# Patient Record
Sex: Male | Born: 1980 | Race: Black or African American | Hispanic: No | Marital: Single | Smoking: Former smoker
Health system: Southern US, Community
[De-identification: ages and names within clinical notes are randomized; demographics above are authoritative.]

## PROBLEM LIST (undated history)

## (undated) DIAGNOSIS — G4733 Obstructive sleep apnea (adult) (pediatric): Secondary | ICD-10-CM

## (undated) DIAGNOSIS — F419 Anxiety disorder, unspecified: Secondary | ICD-10-CM

## (undated) DIAGNOSIS — L219 Seborrheic dermatitis, unspecified: Secondary | ICD-10-CM

## (undated) DIAGNOSIS — R911 Solitary pulmonary nodule: Secondary | ICD-10-CM

## (undated) DIAGNOSIS — A6000 Herpesviral infection of urogenital system, unspecified: Secondary | ICD-10-CM

## (undated) DIAGNOSIS — H5200 Hypermetropia, unspecified eye: Secondary | ICD-10-CM

## (undated) DIAGNOSIS — E669 Obesity, unspecified: Secondary | ICD-10-CM

## (undated) DIAGNOSIS — I10 Essential (primary) hypertension: Secondary | ICD-10-CM

## (undated) DIAGNOSIS — R3129 Other microscopic hematuria: Secondary | ICD-10-CM

## (undated) HISTORY — DX: Obstructive sleep apnea (adult) (pediatric): G47.33

## (undated) HISTORY — PX: WISDOM TOOTH EXTRACTION: SHX21

## (undated) HISTORY — DX: Hypermetropia, unspecified eye: H52.00

## (undated) HISTORY — DX: Seborrheic dermatitis, unspecified: L21.9

## (undated) HISTORY — DX: Anxiety disorder, unspecified: F41.9

## (undated) HISTORY — DX: Herpesviral infection of urogenital system, unspecified: A60.00

## (undated) HISTORY — DX: Solitary pulmonary nodule: R91.1

## (undated) HISTORY — DX: Essential (primary) hypertension: I10

## (undated) HISTORY — DX: Obesity, unspecified: E66.9

## (undated) HISTORY — DX: Other microscopic hematuria: R31.29

---

## 2008-11-29 ENCOUNTER — Emergency Department (HOSPITAL_COMMUNITY): Admission: EM | Admit: 2008-11-29 | Discharge: 2008-11-29 | Payer: Self-pay | Admitting: Emergency Medicine

## 2009-06-26 ENCOUNTER — Emergency Department (HOSPITAL_COMMUNITY): Admission: EM | Admit: 2009-06-26 | Discharge: 2009-06-26 | Payer: Self-pay | Admitting: Family Medicine

## 2010-01-21 ENCOUNTER — Emergency Department (HOSPITAL_COMMUNITY): Admission: EM | Admit: 2010-01-21 | Discharge: 2009-03-23 | Payer: Self-pay | Admitting: Emergency Medicine

## 2010-05-20 LAB — RAPID STREP SCREEN (MED CTR MEBANE ONLY): Streptococcus, Group A Screen (Direct): NEGATIVE

## 2011-02-15 DIAGNOSIS — R3129 Other microscopic hematuria: Secondary | ICD-10-CM

## 2011-02-15 DIAGNOSIS — R911 Solitary pulmonary nodule: Secondary | ICD-10-CM

## 2011-02-15 HISTORY — DX: Other microscopic hematuria: R31.29

## 2011-02-15 HISTORY — DX: Solitary pulmonary nodule: R91.1

## 2011-08-01 ENCOUNTER — Encounter: Payer: Self-pay | Admitting: Medical

## 2011-08-01 ENCOUNTER — Ambulatory Visit (INDEPENDENT_AMBULATORY_CARE_PROVIDER_SITE_OTHER): Payer: BC Managed Care – PPO | Admitting: Medical

## 2011-08-01 VITALS — BP 122/80 | HR 76 | Temp 97.8°F | Resp 16 | Ht 73.0 in | Wt 241.0 lb

## 2011-08-01 DIAGNOSIS — A6000 Herpesviral infection of urogenital system, unspecified: Secondary | ICD-10-CM

## 2011-08-01 DIAGNOSIS — Z0000 Encounter for general adult medical examination without abnormal findings: Secondary | ICD-10-CM

## 2011-08-01 DIAGNOSIS — Z23 Encounter for immunization: Secondary | ICD-10-CM

## 2011-08-01 DIAGNOSIS — E669 Obesity, unspecified: Secondary | ICD-10-CM

## 2011-08-01 LAB — COMPREHENSIVE METABOLIC PANEL
ALT: 30 U/L (ref 0–53)
AST: 19 U/L (ref 0–37)
Albumin: 4.5 g/dL (ref 3.5–5.2)
Alkaline Phosphatase: 56 U/L (ref 39–117)
BUN: 15 mg/dL (ref 6–23)
CO2: 25 mEq/L (ref 19–32)
Calcium: 9.4 mg/dL (ref 8.4–10.5)
Chloride: 100 mEq/L (ref 96–112)
Creat: 0.88 mg/dL (ref 0.50–1.35)
Glucose, Bld: 84 mg/dL (ref 70–99)
Potassium: 4.3 mEq/L (ref 3.5–5.3)
Sodium: 133 mEq/L — ABNORMAL LOW (ref 135–145)
Total Bilirubin: 0.4 mg/dL (ref 0.3–1.2)
Total Protein: 7.4 g/dL (ref 6.0–8.3)

## 2011-08-01 LAB — POCT URINALYSIS DIPSTICK
Bilirubin, UA: NEGATIVE
Glucose, UA: NEGATIVE
Ketones, UA: NEGATIVE
Leukocytes, UA: NEGATIVE
Nitrite, UA: NEGATIVE
Protein, UA: NEGATIVE
Spec Grav, UA: <=1.005
Urobilinogen, UA: NEGATIVE
pH, UA: 5

## 2011-08-01 LAB — CBC
HCT: 47.6 % (ref 39.0–52.0)
Hemoglobin: 16.2 g/dL (ref 13.0–17.0)
MCH: 29.6 pg (ref 26.0–34.0)
MCHC: 34 g/dL (ref 30.0–36.0)
MCV: 86.9 fL (ref 78.0–100.0)
Platelets: 318 10*3/uL (ref 150–400)
RBC: 5.48 MIL/uL (ref 4.22–5.81)
RDW: 13.3 % (ref 11.5–15.5)
WBC: 6 10*3/uL (ref 4.0–10.5)

## 2011-08-01 LAB — LIPID PANEL
Cholesterol: 122 mg/dL (ref 0–200)
HDL: 39 mg/dL — ABNORMAL LOW (ref >39)
LDL Cholesterol: 68 mg/dL (ref 0–99)
Total CHOL/HDL Ratio: 3.1 Ratio
Triglycerides: 76 mg/dL (ref <150)
VLDL: 15 mg/dL (ref 0–40)

## 2011-08-01 MED ORDER — VALACYCLOVIR HCL 500 MG PO TABS: ORAL_TABLET | ORAL | Status: DC

## 2011-08-01 NOTE — Patient Instructions (Signed)
Preventative Care for Adults, Male       REGULAR HEALTH EXAMS:  A routine yearly physical is a good way to check in with your primary care provider about your health and preventive screening. It is also an opportunity to share updates about your health and any concerns you have, and receive a thorough all-over exam.   Most health insurance companies pay for at least some preventative services.  Check with your health plan for specific coverages.  WHAT PREVENTATIVE SERVICES DO MEN NEED?  Adult men should have their weight and blood pressure checked regularly.   Men age 31 and older should have their cholesterol levels checked regularly.  Beginning at age 31 and continuing to age 31, men should be screened for colorectal cancer.  Certain people should may need continued testing until age 85.  Other cancer screening may include exams for testicular and prostate cancer.  Updating vaccinations is part of preventative care.  Vaccinations help protect against diseases such as the flu.  Lab tests are generally done as part of preventative care to screen for anemia and blood disorders, to screen for problems with the kidneys and liver, to screen for bladder problems, to check blood sugar, and to check your cholesterol level.  Preventative services generally include counseling about diet, exercise, avoiding tobacco, drugs, excessive alcohol consumption, and sexually transmitted infections.    GENERAL RECOMMENDATIONS FOR GOOD HEALTH:  Healthy diet:  Eat a variety of foods, including fruit, vegetables, animal or vegetable protein, such as meat, fish, chicken, and eggs, or beans, lentils, tofu, and grains, such as rice.  Drink plenty of water daily.  Decrease saturated fat in the diet, avoid lots of red meat, processed foods, sweets, fast foods, and fried foods.  Exercise:  Aerobic exercise helps maintain good heart health. At least 30-40 minutes of moderate-intensity exercise is recommended.  For example, a brisk walk that increases your heart rate and breathing. This should be done on most days of the week.   Find a type of exercise or a variety of exercises that you enjoy so that it becomes a part of your daily life.  Examples are running, walking, swimming, water aerobics, and biking.  For motivation and support, explore group exercise such as aerobic class, spin class, Zumba, Yoga,or  martial arts, etc.    Set exercise goals for yourself, such as a certain weight goal, walk or run in a race such as a 5k walk/run.  Speak to your primary care provider about exercise goals.  Disease prevention:  If you smoke or chew tobacco, find out from your caregiver how to quit. It can literally save your life, no matter how long you have been a tobacco user. If you do not use tobacco, never begin.   Maintain a healthy diet and normal weight. Increased weight leads to problems with blood pressure and diabetes.   The Body Mass Index or BMI is a way of measuring how much of your body is fat. Having a BMI above 27 increases the risk of heart disease, diabetes, hypertension, stroke and other problems related to obesity. Your caregiver can help determine your BMI and based on it develop an exercise and dietary program to help you achieve or maintain this important measurement at a healthful level.  High blood pressure causes heart and blood vessel problems.  Persistent high blood pressure should be treated with medicine if weight loss and exercise do not work.   Fat and cholesterol leaves deposits in your arteries   that can block them. This causes heart disease and vessel disease elsewhere in your body.  If your cholesterol is found to be high, or if you have heart disease or certain other medical conditions, then you may need to have your cholesterol monitored frequently and be treated with medication.   Ask if you should have a stress test if your history suggests this. A stress test is a test done on  a treadmill that looks for heart disease. This test can find disease prior to there being a problem.  Avoid drinking alcohol in excess (more than two drinks per day).  Avoid use of street drugs. Do not share needles with anyone. Ask for professional help if you need assistance or instructions on stopping the use of alcohol, cigarettes, and/or drugs.  Brush your teeth twice a day with fluoride toothpaste, and floss once a day. Good oral hygiene prevents tooth decay and gum disease. The problems can be painful, unattractive, and can cause other health problems. Visit your dentist for a routine oral and dental check up and preventive care every 6-12 months.   Look at your skin regularly.  Use a mirror to look at your back. Notify your caregivers of changes in moles, especially if there are changes in shapes, colors, a size larger than a pencil eraser, an irregular border, or development of new moles.  Safety:  Use seatbelts 100% of the time, whether driving or as a passenger.  Use safety devices such as hearing protection if you work in environments with loud noise or significant background noise.  Use safety glasses when doing any work that could send debris in to the eyes.  Use a helmet if you ride a bike or motorcycle.  Use appropriate safety gear for contact sports.  Talk to your caregiver about gun safety.  Use sunscreen with a SPF (or skin protection factor) of 15 or greater.  Lighter skinned people are at a greater risk of skin cancer. Don't forget to also wear sunglasses in order to protect your eyes from too much damaging sunlight. Damaging sunlight can accelerate cataract formation.   Practice safe sex. Use condoms. Condoms are used for birth control and to help reduce the spread of sexually transmitted infections (or STIs).  Some of the STIs are gonorrhea (the clap), chlamydia, syphilis, trichomonas, herpes, HPV (human papilloma virus) and HIV (human immunodeficiency virus) which causes AIDS.  The herpes, HIV and HPV are viral illnesses that have no cure. These can result in disability, cancer and death.   Keep carbon monoxide and smoke detectors in your home functioning at all times. Change the batteries every 6 months or use a model that plugs into the wall.   Vaccinations:  Stay up to date with your tetanus shots and other required immunizations. You should have a booster for tetanus every 10 years. Be sure to get your flu shot every year, since 5%-20% of the [place] population comes down with the flu. The flu vaccine changes each year, so being vaccinated once is not enough. Get your shot in the fall, before the flu season peaks.   Other vaccines to consider:  Pneumococcal vaccine to protect against certain types of pneumonia.  This is normally recommended for adults age 65 or older.  However, adults younger than 31 years old with certain underlying conditions such as diabetes, heart or lung disease should also receive the vaccine.  Shingles vaccine to protect against Varicella Zoster if you are older than age 60, or younger   than 31 years old with certain underlying illness.  Hepatitis A vaccine to protect against a form of infection of the liver by a virus acquired from food.  Hepatitis B vaccine to protect against a form of infection of the liver by a virus acquired from blood or body fluids, particularly if you work in health care.  If you plan to travel internationally, check with your local health department for specific vaccination recommendations.  Cancer Screening:  Most routine colon cancer screening begins at the age of 50. On a yearly basis, doctors may provide special easy to use take-home tests to check for hidden blood in the stool. Sigmoidoscopy or colonoscopy can detect the earliest forms of colon cancer and is life saving. These tests use a small camera at the end of a tube to directly examine the colon. Speak to your caregiver about this at age 50, when routine  screening begins (and is repeated every 5 years unless early forms of pre-cancerous polyps or small growths are found).   At the age of 50 men usually start screening for prostate cancer every year. Screening may begin at a younger age for those with higher risk. Those at higher risk include African-Americans or having a family history of prostate cancer. There are two types of tests for prostate cancer:   Prostate-specific antigen (PSA) testing. Recent studies raise questions about prostate cancer using PSA and you should discuss this with your caregiver.   Digital rectal exam (in which your doctor's lubricated and gloved finger feels for enlargement of the prostate through the anus).   Screening for testicular cancer.  Do a monthly exam of your testicles. Gently roll each testicle between your thumb and fingers, feeling for any abnormal lumps. The best time to do this is after a hot shower or bath when the tissues are looser. Notify your caregivers of any lumps, tenderness or changes in size or shape immediately.     

## 2011-08-01 NOTE — Progress Notes (Signed)
Subjective:   HPI  Ryan Herman is a 31 y.o. male who presents for a complete physical.  New patient today.  Has just went to [facility] for medical issues prior.    Preventative care: Last ophthalmology visit: within the year Last dental visit: over a year ago Last tetanus booster: >10 years  Reviewed their medical, surgical, family, social, medication, and allergy history and updated chart as appropriate.  Past Medical History  Diagnosis Date  . Farsightedness     wears glasses  . Genital herpes     diagnosed 2008    Past Surgical History  Procedure Date  . Wisdom tooth extraction     Family History  Problem Relation Age of Onset  . Hypertension Mother   . Diabetes Father   . Hypertension Brother   . Cancer Maternal Uncle     prostate  . Stroke Neg Hx   . Heart disease Neg Hx     History   Social History  . Marital Status: Married    Spouse Name: N/A    Number of Children: N/A  . Years of Education: N/A   Occupational History  . drives bus for [employer]    Social History Main Topics  . Smoking status: Former Smoker -- 10 years    Types: Cigarettes    Quit date: 07/31/2008  . Smokeless tobacco: Not on file   Comment: prior black and milds, but not daily  . Alcohol Use: No     used to drink heavy partying in younger years, but not daily  . Drug Use: No  . Sexually Active: Not on file   Other Topics Concern  . Not on file   Social History Narrative   Married, 1 daughter 7yo, exercise - some with walking    No current outpatient prescriptions on file prior to visit.    No Known Allergies    Review of Systems Constitutional: -fever, -chills, -sweats, -unexpected weight change, -anorexia, -fatigue Allergy: -sneezing, -itching, -congestion Dermatology: denies changing moles, rash, lumps, new worrisome lesions ENT: -runny nose, -ear pain, -sore throat, -hoarseness, -sinus pain, +teeth pain, -tinnitus, -hearing loss, -epistaxis Cardiology:  -chest  pain, -palpitations, -edema, -orthopnea, -paroxysmal nocturnal dyspnea Respiratory: -cough, -shortness of breath, -dyspnea on exertion, -wheezing, -hemoptysis Gastroenterology: -abdominal pain, -nausea, -vomiting, -diarrhea, -constipation, -blood in stool, -changes in bowel movement, -dysphagia Hematology: -bleeding or bruising problems Musculoskeletal: -arthralgias, -myalgias, -joint swelling, -back pain, -neck pain, -cramping, -gait changes Ophthalmology: -vision changes, -eye redness, -itching, -discharge Urology: -dysuria, -difficulty urinating, -hematuria, -urinary frequency, -urgency, incontinence Neurology: -headache, -weakness, -tingling, -numbness, -speech abnormality, -memory loss, -falls, -dizziness Psychology:  -depressed mood, -agitation, -sleep problems     Objective:   Physical Exam  Filed Vitals:   08/01/11 1043  BP: 122/80  Pulse: 76  Temp: 97.8 F (36.6 C)  Resp: 16    General appearance: alert, no distress, WD/WN, AA male Skin: left nare in fold with 3mm raised brown papule umbilicated, unchanged for years per pt, few scattered benign appearing macules , no worrisome lesions HEENT: normocephalic, conjunctiva/corneas normal, sclerae anicteric, PERRLA, EOMi, nares patent, no discharge or erythema, pharynx normal Oral cavity: MMM, tongue normal, teeth in good repair Neck: supple, no lymphadenopathy, no thyromegaly, no masses, normal ROM, no bruits Chest: non tender, normal shape and expansion Heart: RRR, normal S1, S2, no murmurs Lungs: CTA bilaterally, no wheezes, rhonchi, or rales Abdomen: +bs, soft, non tender, non distended, no masses, no hepatomegaly, no splenomegaly, no bruits Back: non tender, normal  ROM, no scoliosis Musculoskeletal: upper extremities non tender, no obvious deformity, normal ROM throughout, lower extremities non tender, no obvious deformity, normal ROM throughout Extremities: no edema, no cyanosis, no clubbing Pulses: 2+ symmetric, upper and  lower extremities, normal cap refill Neurological: alert, oriented x 3, CN2-12 intact, strength normal upper extremities and lower extremities, sensation normal throughout, DTRs 2+ throughout, no cerebellar signs, gait normal Psychiatric: normal affect, behavior normal, pleasant  GU: normal male external genitalia, nontender, no masses, no hernia, no lymphadenopathy Rectal: deferred   Assessment and Plan :    Encounter Diagnoses  Name Primary?  . Routine general medical examination at a health care facility Yes  . Genital herpes   . Need for diphtheria-tetanus-pertussis (Tdap) vaccine   . Obesity     Physical exam - discussed healthy lifestyle, diet, exercise, preventative care, vaccinations, and addressed their concerns. Requested prior records.   Genital herpes - we will switch to Valtrex once daily suppressive therapy for easier dosing  Tdap given, vaccine counseling and VIS given   Obesity - discussed need for lifestyle changes.   Follow-up pending labs

## 2011-08-02 ENCOUNTER — Encounter: Payer: Self-pay | Admitting: Medical

## 2011-08-03 ENCOUNTER — Telehealth: Payer: Self-pay | Admitting: Medical

## 2011-08-03 NOTE — Telephone Encounter (Signed)
I have never had a patient c/o dizziness with Valtrex. I do not feel that it is an issue.  This medication is well tolerated.  Obviously if he feels dizziness, then he can call and let me know.

## 2011-08-04 NOTE — Telephone Encounter (Signed)
Pt was notified of exact words and will try valtrex and if any side effects will call back

## 2011-08-16 ENCOUNTER — Other Ambulatory Visit (INDEPENDENT_AMBULATORY_CARE_PROVIDER_SITE_OTHER): Payer: BC Managed Care – PPO

## 2011-08-16 DIAGNOSIS — R300 Dysuria: Secondary | ICD-10-CM

## 2011-08-16 LAB — POCT URINALYSIS DIPSTICK
Bilirubin, UA: NEGATIVE
Glucose, UA: NEGATIVE
Ketones, UA: NEGATIVE
Leukocytes, UA: NEGATIVE
Nitrite, UA: NEGATIVE
Protein, UA: NEGATIVE
Spec Grav, UA: 1.02
Urobilinogen, UA: NEGATIVE
pH, UA: 6

## 2011-09-27 ENCOUNTER — Other Ambulatory Visit (INDEPENDENT_AMBULATORY_CARE_PROVIDER_SITE_OTHER): Payer: BC Managed Care – PPO

## 2011-09-27 DIAGNOSIS — R319 Hematuria, unspecified: Secondary | ICD-10-CM

## 2011-09-27 LAB — POCT URINALYSIS DIPSTICK
Bilirubin, UA: NEGATIVE
Glucose, UA: NEGATIVE
Ketones, UA: NEGATIVE
Leukocytes, UA: NEGATIVE
Nitrite, UA: NEGATIVE
Protein, UA: NEGATIVE
Spec Grav, UA: 1.01
Urobilinogen, UA: NEGATIVE
pH, UA: 5

## 2011-09-30 ENCOUNTER — Telehealth: Payer: Self-pay | Admitting: Family Medicine

## 2011-09-30 NOTE — Telephone Encounter (Signed)
Patient called back and said that he is not having burning while urinating. He said he is not seeing any blood while urinating. He also said that he was not having any pelvis pain. He states he will follow up in 2 weeks with UA sample as directed. CLS

## 2011-10-19 ENCOUNTER — Other Ambulatory Visit: Payer: BC Managed Care – PPO

## 2011-11-22 ENCOUNTER — Other Ambulatory Visit (INDEPENDENT_AMBULATORY_CARE_PROVIDER_SITE_OTHER): Payer: BC Managed Care – PPO

## 2011-11-22 DIAGNOSIS — R300 Dysuria: Secondary | ICD-10-CM

## 2011-11-22 LAB — POCT URINALYSIS DIPSTICK
Bilirubin, UA: NEGATIVE
Glucose, UA: NEGATIVE
Ketones, UA: NEGATIVE
Leukocytes, UA: NEGATIVE
Nitrite, UA: NEGATIVE
Protein, UA: NEGATIVE
Spec Grav, UA: 1.02
Urobilinogen, UA: NEGATIVE
pH, UA: 5

## 2011-12-06 ENCOUNTER — Institutional Professional Consult (permissible substitution): Payer: BC Managed Care – PPO | Admitting: Medical

## 2011-12-13 ENCOUNTER — Other Ambulatory Visit (INDEPENDENT_AMBULATORY_CARE_PROVIDER_SITE_OTHER): Payer: BC Managed Care – PPO

## 2011-12-13 DIAGNOSIS — R300 Dysuria: Secondary | ICD-10-CM

## 2011-12-13 LAB — POCT URINALYSIS DIPSTICK
Bilirubin, UA: NEGATIVE
Glucose, UA: NEGATIVE
Ketones, UA: NEGATIVE
Leukocytes, UA: NEGATIVE
Nitrite, UA: NEGATIVE
Spec Grav, UA: 1.02
Urobilinogen, UA: NEGATIVE
pH, UA: 5

## 2011-12-15 LAB — URINE CULTURE
Colony Count: NO GROWTH
Organism ID, Bacteria: NO GROWTH

## 2012-02-21 ENCOUNTER — Encounter: Payer: Self-pay | Admitting: Medical

## 2012-02-21 ENCOUNTER — Ambulatory Visit (INDEPENDENT_AMBULATORY_CARE_PROVIDER_SITE_OTHER): Payer: BC Managed Care – PPO | Admitting: Medical

## 2012-02-21 VITALS — BP 118/80 | HR 92 | Temp 98.1°F | Resp 16 | Wt 225.0 lb

## 2012-02-21 DIAGNOSIS — R911 Solitary pulmonary nodule: Secondary | ICD-10-CM

## 2012-02-21 DIAGNOSIS — F430 Acute stress reaction: Secondary | ICD-10-CM

## 2012-02-21 NOTE — Progress Notes (Signed)
Subjective: Here for concerns that he needs some time off work.  He and his wife of 5 years split up Christmas eve.  She is 32 years older than him.  He thinks the age difference was part of the problem.  They just weren't seeing eye to eye, not getting along.   She decided that she needed to be out of the relationship.   She is not his daughter mother but has been involved with his daughter for 3 years.  He was off a week over Christmas and during this time his 8yo daughter was staying with his mother who was helping watch her.  He is currently stressed for a variety of reasons.   He is dealing with the break up.  He notes that his daughter is from a previous lady and ex wife wasn't ready to raise another child.  She has grown children of her own, although she has been in his daughter's life for 3 years.  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   He needs a little time to try and come up with a plan to get a solution in place for his daughter and to mentally try and sort things out.   No prior hx/o depression or anxiety.    He just recently saw urology for hematuria, and although no major problem was found with that, a CT of abdomen did review a very small pulmonary nodule.  He was advised to f/u here for this.   He quit tobacco 4 years ago.  Past Medical History  Diagnosis Date  . Farsightedness     wears glasses  . Genital herpes     diagnosed 2008   ROS unremarkable  Objective: Gen: wd, wn, nad Psych: pleasant, answers questions appropriately  Assessment: Encounter Diagnoses  Name Primary?  . Acute stress reaction Yes  . Pulmonary nodule    Plan: Acute stress reaction - discussed his concerns and expressed empathy for his situations.  Advised he check with Dept of Social Services, local day care centers for resources.  consider counseling to deal with  emotional stress of the recent break up.  Advised that he will likely need to submit FMLA forms.   Pulmonary nodule - reviewed CT abdomen.   Plan to repeat Chest CT in 1 year.  Low risk.

## 2012-02-24 ENCOUNTER — Encounter: Payer: Self-pay | Admitting: Medical

## 2012-07-14 ENCOUNTER — Other Ambulatory Visit: Payer: Self-pay | Admitting: Medical

## 2012-07-16 NOTE — Telephone Encounter (Signed)
PATIENT NEEDS TO SCHEDULE A OFFICE VISIT OR PHYSICAL IN ORDER TO RECEIVE ANYMORE REFILLS.

## 2012-08-24 ENCOUNTER — Other Ambulatory Visit: Payer: Self-pay | Admitting: Medical

## 2012-08-27 ENCOUNTER — Telehealth: Payer: Self-pay | Admitting: Medical

## 2012-08-27 NOTE — Telephone Encounter (Signed)
Is this okay to refill? 

## 2012-08-28 ENCOUNTER — Other Ambulatory Visit: Payer: Self-pay | Admitting: Medical

## 2012-08-28 MED ORDER — VALACYCLOVIR HCL 500 MG PO TABS: 500.0000 mg | ORAL_TABLET | Freq: Every day | ORAL | Status: DC

## 2012-08-28 NOTE — Telephone Encounter (Signed)
LM

## 2012-10-10 ENCOUNTER — Ambulatory Visit: Payer: Self-pay | Admitting: Medical

## 2012-11-20 ENCOUNTER — Encounter: Payer: Self-pay | Admitting: Medical

## 2012-11-21 ENCOUNTER — Encounter: Payer: BC Managed Care – PPO | Admitting: Medical

## 2013-01-29 ENCOUNTER — Ambulatory Visit (INDEPENDENT_AMBULATORY_CARE_PROVIDER_SITE_OTHER): Payer: BC Managed Care – PPO | Admitting: Medical

## 2013-01-29 ENCOUNTER — Encounter: Payer: Self-pay | Admitting: Medical

## 2013-01-29 VITALS — BP 122/82 | HR 78 | Temp 98.1°F | Resp 16 | Ht 73.0 in | Wt 255.0 lb

## 2013-01-29 DIAGNOSIS — R911 Solitary pulmonary nodule: Secondary | ICD-10-CM

## 2013-01-29 DIAGNOSIS — F411 Generalized anxiety disorder: Secondary | ICD-10-CM

## 2013-01-29 DIAGNOSIS — Z0000 Encounter for general adult medical examination without abnormal findings: Secondary | ICD-10-CM

## 2013-01-29 DIAGNOSIS — A6000 Herpesviral infection of urogenital system, unspecified: Secondary | ICD-10-CM

## 2013-01-29 DIAGNOSIS — E669 Obesity, unspecified: Secondary | ICD-10-CM

## 2013-01-29 LAB — CBC
HCT: 46.9 % (ref 39.0–52.0)
Hemoglobin: 16.5 g/dL (ref 13.0–17.0)
MCH: 30.7 pg (ref 26.0–34.0)
MCHC: 35.2 g/dL (ref 30.0–36.0)
MCV: 87.3 fL (ref 78.0–100.0)
Platelets: 262 10*3/uL (ref 150–400)
RBC: 5.37 MIL/uL (ref 4.22–5.81)
RDW: 13.9 % (ref 11.5–15.5)
WBC: 5 10*3/uL (ref 4.0–10.5)

## 2013-01-29 LAB — BASIC METABOLIC PANEL
BUN: 15 mg/dL (ref 6–23)
CO2: 26 mEq/L (ref 19–32)
Calcium: 9.5 mg/dL (ref 8.4–10.5)
Chloride: 99 mEq/L (ref 96–112)
Creat: 0.98 mg/dL (ref 0.50–1.35)
Glucose, Bld: 100 mg/dL — ABNORMAL HIGH (ref 70–99)
Potassium: 3.9 mEq/L (ref 3.5–5.3)
Sodium: 136 mEq/L (ref 135–145)

## 2013-01-29 MED ORDER — VALACYCLOVIR HCL 500 MG PO TABS: 500.0000 mg | ORAL_TABLET | Freq: Every day | ORAL | Status: DC

## 2013-01-29 NOTE — Addendum Note (Signed)
Addended by: Leretha Dykes L on: 01/29/2013 11:49 AM   Modules accepted: Orders

## 2013-01-29 NOTE — Progress Notes (Signed)
Subjective:   HPI  Ryan Herman is a 32 y.o. male who presents for a complete physical.  Preventative care: Last ophthalmology visit:yes- [facility], saw <5mo ago. Last dental visit:yes Dr. Excell Seltzer- 12/2012 Last colonoscopy:n/a Last prostate exam: n/a Last EKG:2007  Last labs:2013  Prior vaccinations: TD or Tdap:2013 Influenza:no flu vaccine Pneumococcal:n/a Shingles/Zostavax:n/a  Advanced directive:n/a Health care power of attorney:n/a Living will:n/a  Concerns: He expresses interest in trying to lose weight. He is not exercising much. His diet can certainly be improved.  A year ago he and his wife separated, but for counseling they're now back together.  He sees [place] [facility], he has concerns about the mole on the left side of his nose.  Reviewed their medical, surgical, family, social, medication, and allergy history and updated chart as appropriate.  Past Medical History  Diagnosis Date  . Farsightedness     wears glasses  . Genital herpes     diagnosed 2008  . Obesity   . Seborrheic dermatitis of scalp     [facility]  . Microscopic hematuria 2013    [facility], Dr. [name], negative workup   . Pulmonary nodule seen on imaging study 2013  . Anxiety     mild    Past Surgical History  Procedure Laterality Date  . Wisdom tooth extraction      History   Social History  . Marital Status: Married    Spouse Name: N/A    Number of Children: N/A  . Years of Education: N/A   Occupational History  . drives bus for [employer]    Social History Main Topics  . Smoking status: Former Smoker -- 10 years    Types: Cigarettes    Quit date: 07/31/2008  . Smokeless tobacco: Not on file     Comment: prior black and milds, but not daily  . Alcohol Use: No     Comment: used to drink heavy partying in younger years, but not daily  . Drug Use: No  . Sexual Activity: Not on file   Other Topics Concern  . Not on file   Social History Narrative    Married, 1 daughter 8yo, exercise - some with walking.  Drives bus for [employer] [place].  Marital problems 2013, separated briefly, and through counseling, got back together.    Family History  Problem Relation Age of Onset  . Hypertension Mother   . Diabetes Father   . Hypertension Brother   . Cancer Maternal Uncle     prostate  . Stroke Neg Hx   . Heart disease Neg Hx     Current outpatient prescriptions:valACYclovir (VALTREX) 500 MG tablet, Take 1 tablet (500 mg total) by mouth daily., Disp: 30 tablet, Rfl: 11  No Known Allergies   Review of Systems Constitutional: -fever, -chills, -sweats, unexpected weight change, -decreased appetite, -fatigue Allergy: -sneezing, -itching, -congestion Dermatology: +changing moles, --rash, -lumps ENT: -runny nose, -ear pain, -sore throat, -hoarseness, -sinus pain, -teeth pain, - ringing in ears, -hearing loss, -nosebleeds Cardiology: -chest pain, -palpitations, -swelling, -difficulty breathing when lying flat, -waking up short of breath Respiratory: -cough, -shortness of breath, -difficulty breathing with exercise or exertion, -wheezing, -coughing up blood Gastroenterology: -abdominal pain, -nausea, -vomiting, -diarrhea, -constipation, -blood in stool, -changes in bowel movement, -difficulty swallowing or eating Hematology: -bleeding, -bruising  Musculoskeletal: -joint aches, -muscle aches, -joint swelling, -back pain, -neck pain, -cramping, -changes in gait Ophthalmology: denies vision changes, eye redness, itching, discharge Urology: -burning with urination, -difficulty urinating, -blood in  urine, -urinary frequency, -urgency, -incontinence Neurology: +headache, -weakness, -tingling, -numbness, -memory loss, -falls, -dizziness Psychology: -depressed mood, -agitation, +sleep problems     Objective:   Physical Exam  BP 122/82  Pulse 78  Temp(Src) 98.1 F (36.7 C) (Oral)  Resp 16  Ht 6\' 1"  (1.854 m)  Wt 255 lb (115.667 kg)  BMI  33.65 kg/m2  General appearance: alert, no distress, WD/WN, AA male  Skin: left nare in fold with 3mm raised brown papule umbilicated, unchanged for years per pt, few scattered benign appearing macules , no worrisome lesions  HEENT: normocephalic, conjunctiva/corneas normal, sclerae anicteric, PERRLA, EOMi, nares patent, no discharge or erythema, pharynx normal  Oral cavity: MMM, tongue normal, teeth in good repair  Neck: supple, no lymphadenopathy, no thyromegaly, no masses, normal ROM, no bruits  Chest: non tender, normal shape and expansion  Heart: RRR, normal S1, S2, no murmurs  Lungs: CTA bilaterally, no wheezes, rhonchi, or rales  Abdomen: +bs, soft, non tender, non distended, no masses, no hepatomegaly, no splenomegaly, no bruits  Back: non tender, normal ROM, no scoliosis  Musculoskeletal: upper extremities non tender, no obvious deformity, normal ROM throughout, lower extremities non tender, no obvious deformity, normal ROM throughout  Extremities: no edema, no cyanosis, no clubbing  Pulses: 2+ symmetric, upper and lower extremities, normal cap refill  Neurological: alert, oriented x 3, CN2-12 intact, strength normal upper extremities and lower extremities, sensation normal throughout, DTRs 2+ throughout, no cerebellar signs, gait normal  Psychiatric: normal affect, behavior normal, pleasant  GU: normal male external genitalia, uncircumcised, nontender, no masses, no hernia, no lymphadenopathy  Rectal: deferred   Assessment and Plan :      Encounter Diagnoses  Name Primary?  . Routine general medical examination at a health care facility Yes  . Obesity, unspecified   . Anxiety state, unspecified   . Pulmonary nodule seen on imaging study   . Genital herpes     Physical exam - discussed healthy lifestyle, diet, exercise, preventative care, vaccinations, and addressed their concerns.  He declines the flu vaccine. Obesity - we discussed working on diet, exercise, and weight  loss  Anxiety-discussed his concerns, advise counseling.  He declines medication at this time Pulmonary nodule seen incidentally 2013, we will set up for repeat CT of the chest Genital herpes-does well on daily suppressive therapy with Valtrex. Follow-up pending studies

## 2013-01-30 LAB — POCT URINALYSIS DIPSTICK
Bilirubin, UA: NEGATIVE
Glucose, UA: NEGATIVE
Ketones, UA: NEGATIVE
Leukocytes, UA: NEGATIVE
Nitrite, UA: NEGATIVE
Protein, UA: NEGATIVE
Spec Grav, UA: 1.015
Urobilinogen, UA: NEGATIVE
pH, UA: 5

## 2013-01-30 NOTE — Addendum Note (Signed)
Addended by: Janeice Robinson on: 01/30/2013 08:14 AM   Modules accepted: Orders

## 2013-02-01 ENCOUNTER — Other Ambulatory Visit: Payer: BC Managed Care – PPO

## 2013-02-06 ENCOUNTER — Ambulatory Visit
Admission: RE | Admit: 2013-02-06 | Discharge: 2013-02-06 | Disposition: A | Discharge status: Home / Self Care | Payer: BC Managed Care – PPO | Source: Ambulatory Visit | Attending: Medical | Admitting: Medical

## 2013-02-06 DIAGNOSIS — R911 Solitary pulmonary nodule: Secondary | ICD-10-CM

## 2013-02-06 MED ORDER — IOHEXOL 300 MG/ML  SOLN
80.0000 mL | Freq: Once | INTRAMUSCULAR | Status: AC | PRN
  Administered 2013-02-06: 80 mL via INTRAVENOUS

## 2013-03-05 ENCOUNTER — Ambulatory Visit (INDEPENDENT_AMBULATORY_CARE_PROVIDER_SITE_OTHER): Payer: BC Managed Care – PPO

## 2013-03-05 VITALS — BP 124/87 | HR 74 | Resp 16 | Ht 73.0 in | Wt 255.0 lb

## 2013-03-05 DIAGNOSIS — B353 Tinea pedis: Secondary | ICD-10-CM

## 2013-03-05 DIAGNOSIS — L600 Ingrowing nail: Secondary | ICD-10-CM

## 2013-03-05 DIAGNOSIS — L03039 Cellulitis of unspecified toe: Secondary | ICD-10-CM

## 2013-03-05 MED ORDER — CEPHALEXIN 500 MG PO CAPS: 500.0000 mg | ORAL_CAPSULE | Freq: Three times a day (TID) | ORAL | Status: DC

## 2013-03-05 NOTE — Patient Instructions (Addendum)
Betadine Soak Instructions  Purchase an 8 oz. bottle of BETADINE solution (Povidone)  THE DAY AFTER THE PROCEDURE  Place 1 tablespoon of betadine solution in a quart of warm tap water.  Submerge your foot or feet with outer bandage intact for the initial soak; this will allow the bandage to become moist and wet for easy lift off.  Once you remove your bandage, continue to soak in the solution for 20 minutes.  This soak should be done twice a day.  Next, remove your foot or feet from solution, blot dry the affected area and cover.  You may use a band aid large enough to cover the area or use gauze and tape.  Apply other medications to the area as directed by the doctor such as cortisporin otic solution (ear drops) or neosporin.  IF YOUR SKIN BECOMES IRRITATED WHILE USING THESE INSTRUCTIONS, IT IS OKAY TO SWITCH TO EPSOM SALTS AND WATER OR WHITE VINEGAR AND WATER.   Long Term Care Instructions-Post Nail Surgery  You have had your ingrown toenail and root treated with a chemical.  This chemical causes a burn that will drain and ooze like a blister.  This can drain for 6-8 weeks or longer.  It is important to keep this area clean, covered, and follow the soaking instructions dispensed at the time of your surgery.  This area will eventually dry and form a scab.  Once the scab forms you no longer need to soak or apply a dressing.  If at any time you experience an increase in pain, redness, swelling, or drainage, you should contact the office as soon as possible.        Instructions for athlete's foot infection: Obtain Lamisil or Lotrimin cream over-the-counter any drug store applied to the affected areas between the toes twice daily for 3-4 week duration as instructed. Wear clean white cotton or Kerlix socks wash was in hot water and bleach. Also spray all shoes with an antifungal spray such as  Desenex or Tinactin spray

## 2013-03-05 NOTE — Progress Notes (Signed)
   Subjective:    Patient ID: Ryan Herman, male    DOB: 06/06/1980, 33 y.o.   MRN: [id number]  HPI Comments: "I have this toe that gets swollen on the side"   Patient states that his 2nd toe right foot is extremely achy and continues to swell and get red along the medial side of the toenail. Sometimes it drains. States that his wife has clipped out the nail with no relief. Pressure against the toe is uncomfortable.  Patient also states between his toes on both feet keep peeling. The left is worse than the right. They do not itch or burn. He has tried OTC fungus powder, which hasn't helped.     Review of Systems  All other systems reviewed and are negative.       Objective:   Physical Exam Vascular status is intact pedal pulses palpable DP and PT posterior were for bilateral Refill time 3 seconds all digits. Skin temperature warm turgor normal there is some edema and erythema with some history or drainage from the medial nail fold second digit right foot. Neurologically epicritic and proprioceptive sensations intact and symmetric bilateral. Orthopedic biomechanical exam reveals rectus foot type no other osseous abnormalities noted no x-rays taken. 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       Assessment & Plan:  Assessment this time is ingrowing nail second toe right with paronychia plan at this time local anesthetic block Mr. total of 3 cc 50-50 mixture of 2% Xylocaine plain and 0.5 some Marcaine plain in digital fashion to the second toe right foot Betadine prep performed the medial border is excised followed by phenol matricectomy Betadine ointment and dry sterile dressing applied. Patient given instructions and soaking prescription for cephalexin. Recommended Tylenol as needed for pain. Also given instructions for use a topical antifungal such as  Lotrimin or Lamisil Cream twice daily for 3-4 week duration to reappointed 3 for followup and reassessment advised to contact me change difficulties in the interim.  Alvan Dameichard Harold Moncus DPM

## 2013-03-21 ENCOUNTER — Ambulatory Visit (INDEPENDENT_AMBULATORY_CARE_PROVIDER_SITE_OTHER): Payer: BC Managed Care – PPO | Admitting: Family Medicine

## 2013-03-21 ENCOUNTER — Encounter: Payer: Self-pay | Admitting: Family Medicine

## 2013-03-21 VITALS — BP 130/84 | HR 80 | Temp 98.3°F | Ht 73.0 in | Wt 254.0 lb

## 2013-03-21 DIAGNOSIS — J069 Acute upper respiratory infection, unspecified: Secondary | ICD-10-CM

## 2013-03-21 NOTE — Patient Instructions (Signed)
Drink plenty of fluids. I recommend using Mucinex (plain or DM)--this has the expectorant guaifenesin to keep your mucus/phlegm thin.  The DM version is dextromethorphan, which is a cough suppressant.  You can also get DM separately in Delsym syrup.  Only use if needed for cough, but use the guaifenesin regularly during this illness. I also recommend using a decongestant to dry up the nose and prevent drainage down the throat (ie Sudafed, or a combination such as claritin-D or Zyrtec-D). You may use ibuprofen or tylenol as needed for pain, fever.  Call or return next week if symptoms persist or worsen--expect symptom not to start improving for another 3-5 days.

## 2013-03-21 NOTE — Progress Notes (Signed)
Subjective:     Patient ID: Ryan Herman, male   DOB: Oct 18, 1980, 33 y.o.   MRN: [id number]  Chief Complaint  Patient presents with  . Cough    and chest congestion since Tuesday. He is requesting a note for work for a few days.    Ryan Herman presents for Cough  2 days ago he started feeling slightly weak, but yesterday he started with chest congestion and cough.  He went to work this morning (was off the last 2 days), but when he felt the phlegm he was concerned and wanted to get checked out.  He took Nyquil last night, which helped him sleep.  He hasn't been taking any daytime medication.  Slight sniffles, no significant nasal drainage.  Denies sore throat, headache or sinus pain.  Slight right eye watering, no crusting or itching.  Denies fevers or significant myalgias.  Denies sick contacts.   Past Medical History  Diagnosis Date  . Farsightedness     wears glasses  . Genital herpes     diagnosed 2008  . Obesity   . Seborrheic dermatitis of scalp     [facility]  . Microscopic hematuria 2013    [facility], Dr. [name], negative workup   . Pulmonary nodule seen on imaging study 2013  . Anxiety     mild   Past Surgical History  Procedure Laterality Date  . Wisdom tooth extraction     History   Social History  . Marital Status: Married    Spouse Name: N/A    Number of Children: N/A  . Years of Education: N/A   Occupational History  . drives bus for [employer]    Social History Main Topics  . Smoking status: Former Smoker -- 10 years    Types: Cigarettes    Quit date: 07/31/2008  . Smokeless tobacco: Never Used     Comment: prior black and milds, but not daily  . Alcohol Use: Yes     Comment: used to drink heavy partying in younger years, but not daily. Occasional drink now  . Drug Use: No  . Sexual Activity: Not on file   Other Topics Concern  . Not on file   Social History Narrative   Married, 1 daughter 8yo, exercise - some with walking.   Drives bus for [employer].  Marital problems 2013, separated briefly, and through counseling, got back together.    Outpatient Encounter Prescriptions as of 03/21/2013  Medication Sig  . Pseudoeph-Doxylamine-DM-APAP (NYQUIL PO) Take 1 capsule by mouth daily.  . valACYclovir (VALTREX) 500 MG tablet Take 1 tablet (500 mg total) by mouth daily.  . [DISCONTINUED] cephALEXin (KEFLEX) 500 MG capsule Take 1 capsule (500 mg total) by mouth 3 (three) times daily.   No Known Allergies  Review of Systems  Constitutional: Negative.  Negative for fever and chills.  HENT: Positive for congestion and rhinorrhea. Negative for ear pain, hearing loss, nosebleeds and sinus pressure.   Eyes: Negative for pain, itching and visual disturbance.  Respiratory: Positive for cough. Negative for shortness of breath.   Cardiovascular: Negative for chest pain and palpitations.  Gastrointestinal: Negative.   Genitourinary: Negative.   Neurological: Negative.  Negative for dizziness and headaches.  Hematological: Negative.  Negative for adenopathy.       Objective:    Physical Exam  BP 130/84  Pulse 80  Temp(Src) 98.3 F (36.8 C) (Oral)  Ht 6\' 1"  (1.854 m)  Wt 254 lb (115.214 kg)  BMI  33.52 kg/m2  Well developed, pleasant male, with frequent throat clearing. HEENT:  PERRL, EOMI, conjunctiva clear. Nasal mucosa moderately edematous L>R, mild erythema.  No purulence. Sinuses nontender.  TM's and EAC's normal.  OP is clear Neck: no lymphadenopathy or mass Heart: regular rate and rhythm without murmur Lungs: clear bilaterally Skin: no rashes Psych: normal mood, affect Neuro: alert and oriented, cranial nerves intact, normal gait     Assessment and Plan        Acute upper respiratory infections of unspecified site  Drink plenty of fluids. I recommend using Mucinex (plain or DM)--this has the expectorant guaifenesin to keep your mucus/phlegm thin.  The DM version is dextromethorphan, which is a  cough suppressant.  You can also get DM separately in Delsym syrup.  Only use if needed for cough, but use the guaifenesin regularly during this illness. I also recommend using a decongestant to dry up the nose and prevent drainage down the throat (ie Sudafed, or a combination such as claritin-D or Zyrtec-D). You may use ibuprofen or tylenol as needed for pain, fever.  Call or return next week if symptoms persist or worsen--expect symptom not to start improving for another 3-5 days.  Return if symptoms worsen or fail to improve, for note work saying seen in office today.

## 2013-03-22 ENCOUNTER — Telehealth: Payer: Self-pay | Admitting: Internal Medicine

## 2013-03-22 NOTE — Telephone Encounter (Signed)
Patient states that there is not SOB and no wheezing. No pain with deep breathing. He is having pain when he coughs. He said his spit up a little blood and when he blew his nose there was blood. I spoke with Ryan Herman about this and he said its all sinus related and to stay on the course of what Dr. Lynelle DoctorKnapp recommended and if not better by Monday we will call him out a antibiotic. Patient informed. CLS

## 2013-03-22 NOTE — Telephone Encounter (Signed)
Is he wheezing, SOB?  Pain with deep breathing?  How much blood, specs, visible red liquid blood?

## 2013-03-22 NOTE — Telephone Encounter (Signed)
Pt states he was here yesterday and dr. Lynelle DoctorKnapp did not give him anything. Pt woke up this morning and was feeling worst. Pt has chills, coughing up some blood in his mucous, chest is hurting when he coughs. Wants [facility]

## 2013-03-26 ENCOUNTER — Ambulatory Visit (INDEPENDENT_AMBULATORY_CARE_PROVIDER_SITE_OTHER): Payer: BC Managed Care – PPO

## 2013-03-26 VITALS — BP 112/78 | HR 79 | Resp 16

## 2013-03-26 DIAGNOSIS — L600 Ingrowing nail: Secondary | ICD-10-CM

## 2013-03-26 DIAGNOSIS — Z09 Encounter for follow-up examination after completed treatment for conditions other than malignant neoplasm: Secondary | ICD-10-CM

## 2013-03-26 NOTE — Progress Notes (Signed)
   Subjective:    Patient ID: Ryan Herman, male    DOB: 09/01/1980, 33 y.o.   MRN: [id number]  HPI Comments: "The 2nd toe still rubs some. 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 As such patient will maintain Neosporin and Band-Aid dressing daily or try at night otherwise good postop progress no pain noted       Assessment & Plan:  Assessment good postop progress status post AP nail medial border second toe right foot daily and tender in daily or try at night continue soaking his lungs is draining advised to contact us if it has failed to improve or resolve within the next 2-4 weeks  Alvan Dameichard Delorus Langwell DPM

## 2013-03-26 NOTE — Patient Instructions (Signed)

## 2013-06-25 ENCOUNTER — Encounter: Payer: Self-pay | Admitting: Medical

## 2013-06-25 ENCOUNTER — Ambulatory Visit (INDEPENDENT_AMBULATORY_CARE_PROVIDER_SITE_OTHER): Payer: BC Managed Care – PPO | Admitting: Medical

## 2013-06-25 VITALS — BP 112/80 | HR 68 | Temp 98.2°F | Resp 16 | Wt 203.0 lb

## 2013-06-25 DIAGNOSIS — J40 Bronchitis, not specified as acute or chronic: Secondary | ICD-10-CM

## 2013-06-25 DIAGNOSIS — J329 Chronic sinusitis, unspecified: Secondary | ICD-10-CM

## 2013-06-25 MED ORDER — HYDROCODONE-HOMATROPINE 5-1.5 MG/5ML PO SYRP: 5.0000 mL | ORAL_SOLUTION | Freq: Three times a day (TID) | ORAL | Status: DC | PRN

## 2013-06-25 MED ORDER — AMOXICILLIN 875 MG PO TABS: 875.0000 mg | ORAL_TABLET | Freq: Two times a day (BID) | ORAL | Status: DC

## 2013-06-25 NOTE — Progress Notes (Signed)
Subjective:  Ryan BoucheBrandon Herman is a 33 y.o. male who presents for possible bronchitis.  Symptoms include 6 days ago with sore throat, hoarseness, but as the week progressed worse cough, green mucous coughing up and from nose drainage.   Saw some blood tinged nasal drainage.  Daughter has been sick with similar.  Ended up using a friends amoxicillin x 2 days.   Denies fever, no SOB, no NVD, head pressure or ear.  Having bad coughing spells in the night.   No other aggravating or relieving factors.  No other c/o.  The following portions of the patient's history were reviewed and updated as appropriate: allergies, current medications, past family history, past medical history, past social history, past surgical history and problem list.  ROS as in subjective  Past Medical History  Diagnosis Date  . Farsightedness     wears glasses  . Genital herpes     diagnosed 2008  . Obesity   . Seborrheic dermatitis of scalp     [facility]  . Microscopic hematuria 2013    [facility], Dr. Berneice HeinrichManny, negative workup   . Pulmonary nodule seen on imaging study 2013  . Anxiety     mild     Objective: BP 112/80  Pulse 68  Temp(Src) 98.2 F (36.8 C) (Oral)  Resp 16  Wt 203 lb (92.08 kg)   General appearance: Alert, WD/WN, no distress                             Skin: warm, no rash, no diaphoresis                           Head: + sinus tenderness                            Eyes: conjunctiva normal, corneas clear, PERRLA                            Ears: pearly TMs, external ear canals normal                          Nose: septum midline, turbinates swollen, with erythema and mucoid discharge             Mouth/throat: MMM, tongue normal, mild pharyngeal erythema                           Neck: supple, no adenopathy, no thyromegaly, nontender                          Heart: RRR, normal S1, S2, no murmurs                         Lungs: +bronchial breath sounds, no rhonchi, no wheezes, no  rales                Extremities: no edema, nontender     Assessment: Encounter Diagnosis  Name Primary?  . Sinobronchitis Yes      Plan:  Medication orders today include: Amoxicillin, Hycodan syrup.    Discussed diagnosis and treatment.  Suggested symptomatic OTC remedies for cough and congestion.  Tylenol or Ibuprofen OTC for fever and malaise.  Call/return  in 2-3 days if symptoms are worse or not improving.  Advised that cough may linger even after the infection is improved.

## 2013-08-09 ENCOUNTER — Telehealth: Payer: Self-pay | Admitting: Medical

## 2013-08-09 NOTE — Telephone Encounter (Signed)
Pt called and stated that he would like a rx for a weight loss medication. Pt was informed that he would need an office visit to discuss that. He states at his last appt you discussed it with him and stated that when ever he was ready to call. [facility]. He can be reached at [phone]

## 2013-08-11 NOTE — Telephone Encounter (Signed)
pls call and find out his current weight, current diet and weight loss efforts?

## 2013-08-12 NOTE — Telephone Encounter (Signed)
Patient states that his last weight check was 255 lbs. He said he starting on a diet. He said he will be watching what he eats and walking a lot more. CLS

## 2013-08-12 NOTE — Telephone Encounter (Signed)
I will need to see him back to discussed medication options.   We may have talked about it in December but I don't show documentation that we discussed specific medications, risks/benefits.  Thus, have him try and exercise most days of the week, drink 64 oz of water or more daily, write down a 3 day food intake journal, and f/u at his convenience.    Have him check insurance coverage for Qsymia, Sharyn CreamerBelviq, Contrave prior to coming in.

## 2013-08-13 ENCOUNTER — Ambulatory Visit (INDEPENDENT_AMBULATORY_CARE_PROVIDER_SITE_OTHER): Payer: BC Managed Care – PPO

## 2013-08-13 VITALS — BP 146/87 | HR 77 | Resp 12

## 2013-08-13 DIAGNOSIS — L03039 Cellulitis of unspecified toe: Secondary | ICD-10-CM

## 2013-08-13 DIAGNOSIS — L600 Ingrowing nail: Secondary | ICD-10-CM

## 2013-08-13 MED ORDER — CEPHALEXIN 500 MG PO CAPS: 500.0000 mg | ORAL_CAPSULE | Freq: Three times a day (TID) | ORAL | Status: DC

## 2013-08-13 NOTE — Progress Notes (Signed)
   Subjective:    Patient ID: Ryan Herman, male    DOB: 10/23/1980, 33 y.o.   MRN: [id number]  HPI ''RT FOOT 2ND TOE IS DOING MUCH BETTER, BUT BIG TOENAIL START BOTHERING.''  NEW PROBLEM:  RT FOOT GREAT TOENAIL IS BEEN HURTING FOR 1 WEEK. THE TOE GETTING WORSE AND HAVE BURNING SENSATION. THE TOE GET AGGRAVATED BY PRESSURE. TRIED TO USED PEROXIDE AND IT HELPS SOME.  Review of Systems no new systemic changes or findings noted     Objective:   Physical Exam 33 year old [place] presents this time for recurrence of a problem actually is the second toe is healed well however has than last couple weeks has some drainage and pus coming from the medial border of his right great toe is slightly red tender and inflamed tender on palpation. There is keratosis incurvation the nail confirmed next  J objective findings vascular status is intact pedal pulses palpable epicritic and proprioceptive sensations intact and symmetric bilateral there is normal plantar response, DTRs not elicited orthopedic exam otherwise unremarkable rectus foot noted neurologically there is keratosis incurvation medial border right hallux nail with history of Crohn's and drainage painful tender on palpation. No x-rays taken no other new findings noted       Assessment & Plan:  Assessment ingrowing nail medial border right great toe second toe is healed well following AP nail procedure. At this time my recommendations for permanent nail excision medial border the right hallux again displayed explained risk medication as alternative to the patient and he is repaired for procedure this time local anesthetic administered total of 6 cc 50-50 mixture of 2% Xylocaine plain and 0.5% Marcaine plain were utilized for complete anesthesia members performed the medial border was excised in a wedge second followed by alcohol wash Silvadene cream and gauze dressing being applied. Patient is instructed in Betadine soaks recommended  Tylenol as needed for pain and prescription for cephalexin 500 mg 3 times a day x10 days dispensed recheck in 2-3 weeks if not completely healed at  Alvan Dameichard Margarite Vessel DPM

## 2013-08-13 NOTE — Patient Instructions (Signed)

## 2013-08-13 NOTE — Telephone Encounter (Signed)
Patient is aware of Shane Tysinger PAC message. CLS 

## 2013-08-14 ENCOUNTER — Telehealth: Payer: Self-pay | Admitting: *Deleted

## 2013-08-14 NOTE — Telephone Encounter (Signed)
There yesterday, he cut a piece of my nail out.  He didn't num a piece of my foot.  It kind of hurts.  I'm supposed to go back to work tomorrow.  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  Can he give me a note to be out of work just tomorrow?  It was okayed for one day only.  Marylu LundJanet informed the patient and wrote the letter.

## 2013-08-21 ENCOUNTER — Telehealth: Payer: Self-pay | Admitting: *Deleted

## 2013-08-21 NOTE — Telephone Encounter (Signed)
Calling in regards to minor surgery I had.  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  When I take the sock off it has bleed through the bandaid onto my sock.  I know he said it's okay to go back to work but it causes pressure.  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  Give me a call back.  I called the patient and asked how we can help him.  He stated he may need to take some time off because his toe is getting irritated from the boot.  He wants to know what Dr. Ralene CorkSikora recommends.  I told him I would call him back with a response.

## 2013-08-21 NOTE — Telephone Encounter (Signed)
Calling in regards to the issue with my toe.  I'm not trying to rush you guys.  Trying to see if you got word yet about whether or not you're going to let me be off.  Please give me a call back so I can let my job know.  I returned his call and informed him Dr. Jake SeatsSikora okayed him to be out of work for the remainder of the week.  He said to make sure you have 3 quarters of an inch of space at the end of your shoes.  Continue to soak twice daily and do your dressings, apply Neosporin.  Schedule a followup appointment for next week.  He asked if he can come by to pick up the not today.  I told him yes.

## 2013-08-21 NOTE — Telephone Encounter (Signed)
First month of his toes hitting the end of the shoe tissue was the wrong size. There should BE three quarters of an inch of space in the end of the shoe. The toe should never hit the end of the shoe. Regardless of activities he is doing. However he continued to have some pain and some drainage suggest that he stay out of work 3 end of this week continue doing soaking twice daily apply Neosporin and Band-Aid dress keep his followup appointment sometime next week  Alvan Dameichard Kamber Vignola DPM

## 2013-08-27 ENCOUNTER — Ambulatory Visit (INDEPENDENT_AMBULATORY_CARE_PROVIDER_SITE_OTHER): Payer: BC Managed Care – PPO

## 2013-08-27 VITALS — BP 104/77 | HR 77 | Temp 97.7°F | Resp 16

## 2013-08-27 DIAGNOSIS — Z09 Encounter for follow-up examination after completed treatment for conditions other than malignant neoplasm: Secondary | ICD-10-CM

## 2013-08-27 DIAGNOSIS — L600 Ingrowing nail: Secondary | ICD-10-CM

## 2013-08-27 NOTE — Patient Instructions (Signed)
ANTIBACTERIAL SOAP INSTRUCTIONS  THE DAY AFTER PROCEDURE  Please follow the instructions your doctor has marked.   Shower as usual. Before getting out, place a drop of antibacterial liquid soap (Dial) on a wet, clean washcloth.  Gently wipe washcloth over affected area.  Afterward, rinse the area with warm water.  Blot the area dry with a soft cloth and cover with antibiotic ointment (neosporin, polysporin, bacitracin) and band aid or gauze and tape  Place 3-4 drops of antibacterial liquid soap in a quart of warm tap water.  Submerge foot into water for 20 minutes.  If bandage was applied after your procedure, leave on to allow for easy lift off, then remove and continue with soak for the remaining time.  Next, blot area dry with a soft cloth and cover with a bandage.  Apply other medications as directed by your doctor, such as cortisporin otic solution (eardrops) or neosporin antibiotic ointment  Maintain soaking and Band-Aid his lungs is draining however we've the Band-Aid off at night with a skin air dry and heal completely

## 2013-08-27 NOTE — Progress Notes (Signed)
   Subjective:    Patient ID: Ryan Herman, male    DOB: 12/28/1980, 33 y.o.   MRN: [id number]  HPI  Pt is here to have his right great toe checked, removal of ingrown was done 2 weeks ago, he still has drainage and noted redness and swelling. Toe has been very painful because he has had to wear boots at work. Soaking twice daily, not taking anything for pain, neosporin and bandage daily  Review of Systems no new systemic changes or find     Objective:   Physical Exam Neurovascular status intact pedal pulses palpable epicritic and proprioceptive sensations intact patient status post medial nail border incision is doing well or some mild serous drainage still present slight erythema tissue may be irritating the nail fold at this time to foam pad is applied dispensed. Patient has returned back to work maintain Neosporin and Band-Aid during the day continue soaking daily as needed his lungs or drainage we've the Band-Aid off and let it air dry a night       Assessment & Plan:  Assessment good postop progress no signs of infection still some slight erythema noted the nail border has completely resolved maintain soaking and Neosporin Band-Aid followup this has not healed by the end of the month  Alvan Dameichard Albie Bazin DPM

## 2013-08-28 ENCOUNTER — Ambulatory Visit: Payer: BC Managed Care – PPO

## 2013-11-04 ENCOUNTER — Ambulatory Visit
Admission: RE | Admit: 2013-11-04 | Discharge: 2013-11-04 | Disposition: A | Discharge status: Home / Self Care | Payer: BC Managed Care – PPO | Source: Ambulatory Visit | Attending: Family Medicine | Admitting: Family Medicine

## 2013-11-04 ENCOUNTER — Other Ambulatory Visit: Payer: Self-pay | Admitting: Family Medicine

## 2013-11-04 ENCOUNTER — Encounter: Payer: Self-pay | Admitting: Family Medicine

## 2013-11-04 ENCOUNTER — Ambulatory Visit (INDEPENDENT_AMBULATORY_CARE_PROVIDER_SITE_OTHER): Payer: Worker's Compensation | Admitting: Family Medicine

## 2013-11-04 ENCOUNTER — Telehealth: Payer: Self-pay | Admitting: Family Medicine

## 2013-11-04 VITALS — BP 140/88 | HR 64 | Ht 73.0 in | Wt 256.0 lb

## 2013-11-04 DIAGNOSIS — M546 Pain in thoracic spine: Secondary | ICD-10-CM

## 2013-11-04 NOTE — Telephone Encounter (Signed)
Ok for note, to release to return to work 9/24.

## 2013-11-04 NOTE — Patient Instructions (Addendum)
Go to [facility] for x-rays of thoracic spine (given the bony tenderness on your exam).  Most of your pain is muscular. Heat, massage and stretches (can you ice for the first 24 hours then switch to heat, or alternate ice and heat if that feels better). Use 3-4 tablets of ibuprofen  (total dose of 600-800mg ) three times daily with food, using this regularly until your pain has resolved.  Call for muscle relaxant, if still having a lot of muscular pain and spasm (and indicate if pain is worse at night vs day, so we can decide on sedating vs nonsedating medication).  Follow up with worker's comp--they might have you see one of their contracted providers.  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  Take your ibuprofen as above, with food; discontinue or cut back the dose if you develop stomach pain/discomfort/side effects.  Do not take other over-the-counter pain medications such as advil, motrin, aleve, naproxen at the same time.  Do not use longer than recommended (ok to use for up to 2 weeks).  It is okay to use acetaminophen (tylenol) along with this medication.
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
## 2013-11-04 NOTE — Telephone Encounter (Signed)
Note written for pt and he can come pick up in am. Pt notified

## 2013-11-07 ENCOUNTER — Encounter: Payer: Self-pay | Admitting: Family Medicine

## 2013-11-07 ENCOUNTER — Ambulatory Visit (INDEPENDENT_AMBULATORY_CARE_PROVIDER_SITE_OTHER): Payer: Worker's Compensation | Admitting: Family Medicine

## 2013-11-07 VITALS — BP 130/92 | HR 88 | Ht 73.0 in | Wt 254.0 lb

## 2013-11-07 DIAGNOSIS — J069 Acute upper respiratory infection, unspecified: Secondary | ICD-10-CM | POA: Diagnosis not present

## 2013-11-07 DIAGNOSIS — R42 Dizziness and giddiness: Secondary | ICD-10-CM

## 2013-11-07 DIAGNOSIS — R030 Elevated blood-pressure reading, without diagnosis of hypertension: Secondary | ICD-10-CM | POA: Diagnosis not present

## 2013-11-07 DIAGNOSIS — IMO0001 Reserved for inherently not codable concepts without codable children: Secondary | ICD-10-CM
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
## 2013-11-07 NOTE — Patient Instructions (Signed)
Vertigo Vertigo means you feel like you or your surroundings are moving when they are not. Vertigo can be dangerous if it occurs when you are at work, driving, or performing difficult activities.  CAUSES  Vertigo occurs when there is a conflict of signals sent to your brain from the visual and sensory systems in your body. There are many different causes of vertigo, including:  Infections, especially in the inner ear.  A bad reaction to a drug or misuse of alcohol and medicines.  Withdrawal from drugs or alcohol.  Rapidly changing positions, such as lying down or rolling over in bed.  A migraine headache.  Decreased blood flow to the brain.  Increased pressure in the brain from a head injury, infection, tumor, or bleeding. SYMPTOMS  You may feel as though the world is spinning around or you are falling to the ground. Because your balance is upset, vertigo can cause nausea and vomiting. You may have involuntary eye movements (nystagmus). DIAGNOSIS  Vertigo is usually diagnosed by physical exam. If the cause of your vertigo is unknown, your caregiver may perform imaging tests, such as an MRI scan (magnetic resonance imaging). TREATMENT  Most cases of vertigo resolve on their own, without treatment. Depending on the cause, your caregiver may prescribe certain medicines. If your vertigo is related to body position issues, your caregiver may recommend movements or procedures to correct the problem. In rare cases, if your vertigo is caused by certain inner ear problems, you may need surgery. HOME CARE INSTRUCTIONS   Follow your caregiver's instructions.  Avoid driving.  Avoid operating heavy machinery.  Avoid performing any tasks that would be dangerous to you or others during a vertigo episode.  Tell your caregiver if you notice that certain medicines seem to be causing your vertigo. Some of the medicines used to treat vertigo episodes can actually make them worse in some people. SEEK  IMMEDIATE MEDICAL CARE IF:   Your medicines do not relieve your vertigo or are making it worse.  You develop problems with talking, walking, weakness, or using your arms, hands, or legs.  You develop severe headaches.  Your nausea or vomiting continues or gets worse.  You develop visual changes.  A family member notices behavioral changes.  Your condition gets worse. MAKE SURE YOU:  Understand these instructions.  Will watch your condition.  Will get help right away if you are not doing well or get worse. Document Released: 11/10/2004 Document Revised: 04/25/2011 Document Reviewed: 08/19/2010 [facility] Patient Information 2015 [place], [place]. This information is not intended to replace advice given to you by your health care provider. Make sure you discuss any questions you have with your health care provider.   The medication that we discussed is called meclizine. It comes in  and 12.5mg .  This can potentially cause drowsiness (if it does, use the lower dose).  If it helps with the "wooziness" and doesn't make you sleepy, then it is safe to drive while taking this medication.  Periodically check your blood pressure. Daily exercise (30 minutes) and low sodium diet will help keep the blood pressure down.  Low-Sodium Eating Plan Sodium raises blood pressure and causes water to be held in the body. Getting less sodium from food will help lower your blood pressure, reduce any swelling, and protect your heart, liver, and kidneys. We get sodium by adding salt (sodium chloride) to food. Most of our sodium comes from canned, boxed, and frozen foods. Restaurant foods, fast foods, and pizza are also  very high in sodium. Even if you take medicine to lower your blood pressure or to reduce fluid in your body, getting less sodium from your food is important. WHAT IS MY PLAN? Most people should limit their sodium intake to 2,300 mg a day. Your health care provider recommends that you limit your  sodium intake to __________ a day.  WHAT DO I NEED TO KNOW ABOUT THIS EATING PLAN? For the low-sodium eating plan, you will follow these general guidelines:  Choose foods with a % Daily Value for sodium of less than 5% (as listed on the food label).   Use salt-free seasonings or herbs instead of table salt or sea salt.   Check with your health care provider or pharmacist before using salt substitutes.   Eat fresh foods.  Eat more vegetables and fruits.  Limit canned vegetables. If you do use them, rinse them well to decrease the sodium.   Limit cheese to 1 oz (28 g) per day.   Eat lower-sodium products, often labeled as "lower sodium" or "no salt added."  Avoid foods that contain monosodium glutamate (MSG). MSG is sometimes added to [place] food and some canned foods.  Check food labels (Nutrition Facts labels) on foods to learn how much sodium is in one serving.  Eat more home-cooked food and less restaurant, buffet, and fast food.  When eating at a restaurant, ask that your food be prepared with less salt or none, if possible.  HOW DO I READ FOOD LABELS FOR SODIUM INFORMATION? The Nutrition Facts label lists the amount of sodium in one serving of the food. If you eat more than one serving, you must multiply the listed amount of sodium by the number of servings. Food labels may also identify foods as:  Sodium free--Less than 5 mg in a serving.  Very low sodium--35 mg or less in a serving.  Low sodium--140 mg or less in a serving.  Light in sodium--50% less sodium in a serving. For example, if a food that usually has 300 mg of sodium is changed to become light in sodium, it will have 150 mg of sodium.  Reduced sodium--25% less sodium in a serving. For example, if a food that usually has 400 mg of sodium is changed to reduced sodium, it will have 300 mg of sodium. WHAT FOODS CAN I EAT? Grains Low-sodium cereals, including oats, puffed wheat and rice, and shredded  wheat cereals. Low-sodium crackers. Unsalted rice and pasta. Lower-sodium bread.  Vegetables Frozen or fresh vegetables. Low-sodium or reduced-sodium canned vegetables. Low-sodium or reduced-sodium tomato sauce and paste. Low-sodium or reduced-sodium tomato and vegetable juices.  Fruits Fresh, frozen, and canned fruit. Fruit juice.  Meat and Other Protein Products Low-sodium canned tuna and salmon. Fresh or frozen meat, poultry, seafood, and fish. Lamb. Unsalted nuts. Dried beans, peas, and lentils without added salt. Unsalted canned beans. Homemade soups without salt. Eggs.  Dairy Milk. Soy milk. Ricotta cheese. Low-sodium or reduced-sodium cheeses. Yogurt.  Condiments Fresh and dried herbs and spices. Salt-free seasonings. Onion and garlic powders. Low-sodium varieties of mustard and ketchup. Lemon juice.  Fats and Oils Reduced-sodium salad dressings. Unsalted butter.  Other Unsalted popcorn and pretzels.  The items listed above may not be a complete list of recommended foods or beverages. Contact your dietitian for more options. WHAT FOODS ARE NOT RECOMMENDED? Grains Instant hot cereals. Bread stuffing, pancake, and biscuit mixes. Croutons. Seasoned rice or pasta mixes. Noodle soup cups. Boxed or frozen macaroni and cheese. Self-rising flour.  Regular salted crackers. Vegetables Regular canned vegetables. Regular canned tomato sauce and paste. Regular tomato and vegetable juices. Frozen vegetables in sauces. Salted french fries. Olives. Rosita Fire. Relishes. Sauerkraut. Salsa. Meat and Other Protein Products Salted, canned, smoked, spiced, or pickled meats, seafood, or fish. Bacon, ham, sausage, hot dogs, corned beef, chipped beef, and packaged luncheon meats. Salt pork. Jerky. Pickled herring. Anchovies, regular canned tuna, and sardines. Salted nuts. Dairy Processed cheese and cheese spreads. Cheese curds. Blue cheese and cottage cheese. Buttermilk.  Condiments Onion and  garlic salt, seasoned salt, table salt, and sea salt. Canned and packaged gravies. Worcestershire sauce. Tartar sauce. Barbecue sauce. Teriyaki sauce. Soy sauce, including reduced sodium. Steak sauce. Fish sauce. Oyster sauce. Cocktail sauce. Horseradish. Regular ketchup and mustard. Meat flavorings and tenderizers. Bouillon cubes. Hot sauce. Tabasco sauce. Marinades. Taco seasonings. Relishes. Fats and Oils Regular salad dressings. Salted butter. Margarine. Ghee. Bacon fat.  Other Potato and tortilla chips. Corn chips and puffs. Salted popcorn and pretzels. Canned or dried soups. Pizza. Frozen entrees and pot pies.  The items listed above may not be a complete list of foods and beverages to avoid. Contact your dietitian for more information. Document Released: 07/23/2001 Document Revised: 02/05/2013 Document Reviewed: 12/05/2012 [facility] Patient Information 2015 [place], [place]. This information is not intended to replace advice given to you by your health care provider. Make sure you discuss any questions you have with your health care provider.

## 2013-11-11 ENCOUNTER — Ambulatory Visit (INDEPENDENT_AMBULATORY_CARE_PROVIDER_SITE_OTHER): Payer: Worker's Compensation | Admitting: Medical

## 2013-11-11 ENCOUNTER — Encounter: Payer: Self-pay | Admitting: Medical

## 2013-11-11 VITALS — BP 122/82 | HR 61 | Temp 98.1°F | Resp 16 | Wt 257.0 lb

## 2013-11-11 DIAGNOSIS — R5383 Other fatigue: Secondary | ICD-10-CM

## 2013-11-11 DIAGNOSIS — R42 Dizziness and giddiness: Secondary | ICD-10-CM

## 2013-11-11 DIAGNOSIS — H8110 Benign paroxysmal vertigo, unspecified ear: Secondary | ICD-10-CM

## 2013-11-11 DIAGNOSIS — R531 Weakness: Secondary | ICD-10-CM

## 2013-11-11 DIAGNOSIS — R5381 Other malaise: Secondary | ICD-10-CM

## 2013-11-11 LAB — BASIC METABOLIC PANEL
BUN: 13 mg/dL (ref 6–23)
CO2: 25 mEq/L (ref 19–32)
Calcium: 9.4 mg/dL (ref 8.4–10.5)
Chloride: 101 mEq/L (ref 96–112)
Creat: 0.91 mg/dL (ref 0.50–1.35)
Glucose, Bld: 87 mg/dL (ref 70–99)
Potassium: 3.8 mEq/L (ref 3.5–5.3)
Sodium: 136 mEq/L (ref 135–145)

## 2013-11-11 LAB — CBC
HCT: 46.7 % (ref 39.0–52.0)
Hemoglobin: 16.3 g/dL (ref 13.0–17.0)
MCH: 30.5 pg (ref 26.0–34.0)
MCHC: 34.9 g/dL (ref 30.0–36.0)
MCV: 87.3 fL (ref 78.0–100.0)
Platelets: 259 10*3/uL (ref 150–400)
RBC: 5.35 MIL/uL (ref 4.22–5.81)
RDW: 13.6 % (ref 11.5–15.5)
WBC: 4 10*3/uL (ref 4.0–10.5)

## 2013-11-11 MED ORDER — MECLIZINE HCL 25 MG PO TABS: 25.0000 mg | ORAL_TABLET | Freq: Two times a day (BID) | ORAL | Status: DC
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
## 2013-11-11 NOTE — Patient Instructions (Signed)

## 2013-11-12 ENCOUNTER — Telehealth: Payer: Self-pay | Admitting: Medical

## 2013-11-13 NOTE — Telephone Encounter (Signed)
Form given to pt. 

## 2013-11-14 ENCOUNTER — Emergency Department (HOSPITAL_COMMUNITY)
Admission: EM | Admit: 2013-11-14 | Discharge: 2013-11-14 | Disposition: A | Discharge status: Home / Self Care | Payer: BC Managed Care – PPO

## 2013-11-14 ENCOUNTER — Encounter (HOSPITAL_COMMUNITY): Payer: Self-pay | Admitting: Emergency Medicine

## 2013-11-14 ENCOUNTER — Emergency Department (HOSPITAL_COMMUNITY)
Admission: EM | Admit: 2013-11-14 | Discharge: 2013-11-14 | Disposition: A | Discharge status: Home / Self Care | Payer: BC Managed Care – PPO | Attending: Emergency Medicine | Admitting: Emergency Medicine

## 2013-11-14 DIAGNOSIS — Z8669 Personal history of other diseases of the nervous system and sense organs: Secondary | ICD-10-CM | POA: Diagnosis not present

## 2013-11-14 DIAGNOSIS — Z79899 Other long term (current) drug therapy: Secondary | ICD-10-CM | POA: Insufficient documentation

## 2013-11-14 DIAGNOSIS — B349 Viral infection, unspecified: Secondary | ICD-10-CM | POA: Insufficient documentation

## 2013-11-14 DIAGNOSIS — Z87891 Personal history of nicotine dependence: Secondary | ICD-10-CM | POA: Diagnosis not present

## 2013-11-14 DIAGNOSIS — Z8619 Personal history of other infectious and parasitic diseases: Secondary | ICD-10-CM | POA: Insufficient documentation

## 2013-11-14 DIAGNOSIS — E669 Obesity, unspecified: Secondary | ICD-10-CM | POA: Insufficient documentation

## 2013-11-14 DIAGNOSIS — Z872 Personal history of diseases of the skin and subcutaneous tissue: Secondary | ICD-10-CM | POA: Insufficient documentation

## 2013-11-14 DIAGNOSIS — Z7952 Long term (current) use of systemic steroids: Secondary | ICD-10-CM | POA: Insufficient documentation

## 2013-11-14 DIAGNOSIS — Z8659 Personal history of other mental and behavioral disorders: Secondary | ICD-10-CM | POA: Insufficient documentation

## 2013-11-14 DIAGNOSIS — R51 Headache: Secondary | ICD-10-CM | POA: Diagnosis present

## 2013-11-14 LAB — RAPID STREP SCREEN (MED CTR MEBANE ONLY): Streptococcus, Group A Screen (Direct): NEGATIVE

## 2013-11-14 MED ORDER — IBUPROFEN 600 MG PO TABS: 600.0000 mg | ORAL_TABLET | Freq: Four times a day (QID) | ORAL | Status: DC | PRN

## 2013-11-14 MED ORDER — ACETAMINOPHEN 325 MG PO TABS
650.0000 mg | ORAL_TABLET | Freq: Once | ORAL | Status: AC
  Administered 2013-11-14: 650 mg via ORAL
  Filled 2013-11-14: qty 2

## 2013-11-14 NOTE — Discharge Instructions (Signed)
We saw you in the ER for sore throat, body aches, headaches, fever. We think what you have is a viral syndrome - the treatment for which is symptomatic relief only, and your body will fight the infection off in a few days. We are prescribing you some meds for pain and fevers. See your primary care doctor in 1 week if the symptoms dont improve.   Viral Infections A viral infection can be caused by different types of viruses.Most viral infections are not serious and resolve on their own. However, some infections may cause severe symptoms and may lead to further complications. SYMPTOMS Viruses can frequently cause:  Minor sore throat.  Aches and pains.  Headaches.  Runny nose.  Different types of rashes.  Watery eyes.  Tiredness.  Cough.  Loss of appetite.  Gastrointestinal infections, resulting in nausea, vomiting, and diarrhea. These symptoms do not respond to antibiotics because the infection is not caused by bacteria. However, you might catch a bacterial infection following the viral infection. This is sometimes called a "superinfection." Symptoms of such a bacterial infection may include:  Worsening sore throat with pus and difficulty swallowing.  Swollen neck glands.  Chills and a high or persistent fever.  Severe headache.  Tenderness over the sinuses.  Persistent overall ill feeling (malaise), muscle aches, and tiredness (fatigue).  Persistent cough.  Yellow, green, or brown mucus production with coughing. HOME CARE INSTRUCTIONS   Only take over-the-counter or prescription medicines for pain, discomfort, diarrhea, or fever as directed by your caregiver.  Drink enough water and fluids to keep your urine clear or pale yellow. Sports drinks can provide valuable electrolytes, sugars, and hydration.  Get plenty of rest and maintain proper nutrition. Soups and broths with crackers or rice are fine. SEEK IMMEDIATE MEDICAL CARE IF:   You have severe headaches,  shortness of breath, chest pain, neck pain, or an unusual rash.  You have uncontrolled vomiting, diarrhea, or you are unable to keep down fluids.  You or your child has an oral temperature above 102 F (38.9 C), not controlled by medicine.  Your baby is older than 3 months with a rectal temperature of 102 F (38.9 C) or higher.  Your baby is 343 months old or younger with a rectal temperature of 100.4 F (38 C) or higher. MAKE SURE YOU:   Understand these instructions.  Will watch your condition.  Will get help right away if you are not doing well or get worse. Document Released: 11/10/2004 Document Revised: 04/25/2011 Document Reviewed: 06/07/2010 [facility] Patient Information 2015 [employer] [place]. This information is not intended to replace advice given to you by your health care provider. Make sure you discuss any questions you have with your health care provider.

## 2013-11-14 NOTE — ED Provider Notes (Signed)
CSN: [id number]     Arrival date & time 11/14/13  0127 History   First MD Initiated Contact with Patient 11/14/13 0247     Chief Complaint  Patient presents with  . Headache  . Sore Throat     (Consider location/radiation/quality/duration/timing/severity/associated sxs/prior Treatment) HPI Comments:  Ryan Herman is a 33 y.o. male who complains of sore throat, myalgias, headache, fever and chills for 1 day. He denies a history of chest pain, nausea, shortness of breath, sweats, wheezing, cough and sputum production and denies a history of asthma. Patient denies smoke cigarettes.    Patient is a 33 y.o. male presenting with headaches and pharyngitis. The history is provided by the patient.  Headache Associated symptoms: fatigue and myalgias   Associated symptoms: no neck pain and no neck stiffness   Sore Throat Associated symptoms include headaches.    Past Medical History  Diagnosis Date  . Farsightedness     wears glasses  . Genital herpes     diagnosed 2008  . Obesity   . Seborrheic dermatitis of scalp     [facility]  . Microscopic hematuria 2013    [facility], Dr. Berneice HeinrichManny, negative workup   . Pulmonary nodule seen on imaging study 2013  . Anxiety     mild   Past Surgical History  Procedure Laterality Date  . Wisdom tooth extraction     Family History  Problem Relation Age of Onset  . Hypertension Mother   . Diabetes Father   . Hypertension Brother   . Cancer Maternal Uncle     prostate  . Stroke Neg Hx   . Heart disease Neg Hx    History  Substance Use Topics  . Smoking status: Former Smoker -- 10 years    Types: Cigarettes    Quit date: 07/31/2008  . Smokeless tobacco: Never Used     Comment: prior black and milds, but not daily  . Alcohol Use: Yes     Comment: used to drink heavy partying in younger years, but not daily. Occasional drink now    Review of Systems  Constitutional: Positive for chills and fatigue.  Eyes: Negative for  visual disturbance.  Musculoskeletal: Positive for myalgias. Negative for neck pain and neck stiffness.  Skin: Negative for rash.  Neurological: Positive for headaches.      Allergies  Review of patient's allergies indicates no known allergies.  Home Medications   Prior to Admission medications   Medication Sig Start Date End Date Taking? Authorizing Provider  Clobetasol Propionate 0.05 % shampoo Apply 1 application topically 3 (three) times a week.  06/04/13  Yes Historical Provider, MD  meclizine (ANTIVERT) 25 MG tablet Take 1 tablet (25 mg total) by mouth 2 (two) times daily. 11/11/13  Yes Kermit Baloavid S Tysinger, PA-C  valACYclovir (VALTREX) 500 MG tablet Take 1 tablet (500 mg total) by mouth daily. 01/29/13  Yes Kermit Baloavid S Tysinger, PA-C  ibuprofen (ADVIL,MOTRIN) 600 MG tablet Take 1 tablet (600 mg total) by mouth every 6 (six) hours as needed. 11/14/13   Burnette Valenti Rhunette CroftNanavati, MD   BP 124/65  Pulse 116  Temp(Src) 101.1 F (38.4 C) (Oral)  Resp 20  Ht 6\' 1"  (1.854 m)  Wt 254 lb (115.214 kg)  BMI 33.52 kg/m2  SpO2 95% Physical Exam  Nursing note and vitals reviewed. Constitutional: He is oriented to person, place, and time. He appears well-developed.  HENT:  Head: Normocephalic and atraumatic.  Right Ear: External ear normal.  Left Ear: External  ear normal.  Mouth/Throat: No oropharyngeal exudate.  Eyes: Conjunctivae and EOM are normal. Pupils are equal, round, and reactive to light.  Neck: Normal range of motion. Neck supple.  Cardiovascular: Normal rate and regular rhythm.   Pulmonary/Chest: Effort normal and breath sounds normal.  Abdominal: Soft. Bowel sounds are normal. He exhibits no distension. There is no tenderness. There is no rebound and no guarding.  Lymphadenopathy:    He has no cervical adenopathy.  Neurological: He is alert and oriented to person, place, and time.  Skin: Skin is warm.    ED Course  Procedures (including critical care time) Labs Review Labs Reviewed   RAPID STREP SCREEN  CULTURE, GROUP A STREP    Imaging Review No results found.   EKG Interpretation None      MDM   Final diagnoses:  Viral syndrome    DDX includes: Viral syndrome Influenza Pharyngitis Sinusitis Mononucleosis  Pt comes in with myalgias with diffuse aches, chills, fever, sore throat. No meningeal signs. Pt is immunocompetent. No emesis. Suspect viral syndrome at this time. Though flu is possible, dont see any change in management, if indeed he has flu, so we have not ordered flu swab currently. Advised hydration, and motrin, with return if there are any meningeal signs.  Derwood Kaplan, MD 11/14/13 [phone]

## 2013-11-14 NOTE — ED Notes (Signed)
Pt complains of being weak, having back pain, a headache and a sore throat since about 5pm

## 2013-11-16 LAB — CULTURE, GROUP A STREP

## 2013-11-19 ENCOUNTER — Telehealth: Payer: Self-pay | Admitting: Medical

## 2013-11-19 ENCOUNTER — Encounter: Payer: Self-pay | Admitting: Medical

## 2013-11-19 NOTE — Telephone Encounter (Signed)
Wrote letter and called pt to let him know that it is ready for pick up

## 2013-11-19 NOTE — Telephone Encounter (Signed)
pls do the letter, thanks  UnumProvidentShane

## 2013-11-27 ENCOUNTER — Ambulatory Visit (INDEPENDENT_AMBULATORY_CARE_PROVIDER_SITE_OTHER): Payer: BC Managed Care – PPO | Admitting: Medical

## 2013-11-27 ENCOUNTER — Encounter: Payer: Self-pay | Admitting: Medical

## 2013-11-27 VITALS — BP 100/80 | HR 88 | Temp 98.1°F | Resp 16 | Wt 245.0 lb

## 2013-11-27 DIAGNOSIS — J029 Acute pharyngitis, unspecified: Secondary | ICD-10-CM

## 2013-11-27 DIAGNOSIS — R42 Dizziness and giddiness: Secondary | ICD-10-CM

## 2013-11-27 DIAGNOSIS — R05 Cough: Secondary | ICD-10-CM

## 2013-11-27 DIAGNOSIS — R0989 Other specified symptoms and signs involving the circulatory and respiratory systems: Secondary | ICD-10-CM

## 2013-11-27 DIAGNOSIS — R059 Cough, unspecified: Secondary | ICD-10-CM

## 2013-11-27 MED ORDER — AMOXICILLIN 875 MG PO TABS: 875.0000 mg | ORAL_TABLET | Freq: Two times a day (BID) | ORAL | Status: DC

## 2013-11-27 NOTE — Progress Notes (Signed)
Subjective:  Ryan BoucheBrandon Herman is a 33 y.o. male who presents for illness.   I saw him just a few weeks ago for vertigo which improved with Meclizine, but since then has been to ED for illness, diagnosed with viral URI.  However at this point has had 1.5 week + history of cough, chest congestion, productive cough, sore throat, subjective low grade fever, chills.   Denies SOB, wheezing, rash, NVD.  No sick contacts.  No other aggravating or relieving factors.  No other c/o.  The following portions of the patient's history were reviewed and updated as appropriate: allergies, current medications, past family history, past medical history, past social history, past surgical history and problem list.  ROS as in subjective  Past Medical History  Diagnosis Date  . Farsightedness     wears glasses  . Genital herpes     diagnosed 2008  . Obesity   . Seborrheic dermatitis of scalp     [facility]  . Microscopic hematuria 2013    [facility], Dr. Berneice HeinrichManny, negative workup   . Pulmonary nodule seen on imaging study 2013  . Anxiety     mild     Objective: BP 100/80  Pulse 88  Temp(Src) 98.1 F (36.7 C) (Oral)  Resp 16  Wt 245 lb (111.131 kg)   General appearance: Alert, WD/WN, no distress                             Skin: warm, no rash, no diaphoresis                           Head: no sinus tenderness                            Eyes: conjunctiva normal, corneas clear, PERRLA                            Ears: pearly TMs, external ear canals normal                          Nose: septum midline, turbinates swollen, with erythema and clear discharge             Mouth/throat: MMM, tongue normal, mild pharyngeal erythema                           Neck: supple, no adenopathy, no thyromegaly, nontender                          Heart: RRR, normal S1, S2, no murmurs                         Lungs: +bronchial breath sounds, no rhonchi, no wheezes, no rales                Extremities: no  edema, nontender     Assessment: Encounter Diagnoses  Name Primary?  . Cough Yes  . Sore throat   . Chest congestion   . Vertigo      Plan:  Given symptoms and time frame, begin medication orders today include:amoxicillin, suggested symptomatic OTC remedies for cough and congestion.  Tylenol or Ibuprofen OTC for fever and malaise. Vertigo has  resolved.  Call/return in 2-3 days if symptoms are worse or not improving.

## 2014-01-13 ENCOUNTER — Telehealth: Payer: Self-pay | Admitting: Medical

## 2014-01-13 NOTE — Telephone Encounter (Signed)
I assume he is referring to the pulmonary nodules seen on CT chest 01/2013.  If so, we can do a repeat chest CT.   Chest xray wouldn't be of great help.

## 2014-01-14 ENCOUNTER — Other Ambulatory Visit: Payer: Self-pay | Admitting: Family Medicine

## 2014-01-14 DIAGNOSIS — Z87898 Personal history of other specified conditions: Secondary | ICD-10-CM

## 2014-01-14 NOTE — Telephone Encounter (Signed)
Patient is aware of Ryan CoveyShane Herman PAc message and he is referring to the Chest CT. I am working on his appointment and authorization

## 2014-01-21 ENCOUNTER — Ambulatory Visit
Admission: RE | Admit: 2014-01-21 | Discharge: 2014-01-21 | Disposition: A | Discharge status: Home / Self Care | Payer: BC Managed Care – PPO | Source: Ambulatory Visit | Attending: Medical | Admitting: Medical

## 2014-01-21 DIAGNOSIS — Z87898 Personal history of other specified conditions: Secondary | ICD-10-CM

## 2014-01-21 MED ORDER — IOHEXOL 300 MG/ML  SOLN
75.0000 mL | Freq: Once | INTRAMUSCULAR | Status: AC | PRN
  Administered 2014-01-21: 75 mL via INTRAVENOUS

## 2014-02-05 ENCOUNTER — Encounter: Payer: Self-pay | Admitting: Medical

## 2014-02-05 ENCOUNTER — Ambulatory Visit (INDEPENDENT_AMBULATORY_CARE_PROVIDER_SITE_OTHER): Payer: BC Managed Care – PPO | Admitting: Medical

## 2014-02-05 VITALS — BP 130/80 | HR 84 | Temp 98.4°F | Resp 16 | Ht 73.0 in | Wt 241.0 lb

## 2014-02-05 DIAGNOSIS — Z0000 Encounter for general adult medical examination without abnormal findings: Secondary | ICD-10-CM

## 2014-02-05 DIAGNOSIS — Z113 Encounter for screening for infections with a predominantly sexual mode of transmission: Secondary | ICD-10-CM

## 2014-02-05 DIAGNOSIS — E669 Obesity, unspecified: Secondary | ICD-10-CM | POA: Insufficient documentation

## 2014-02-05 DIAGNOSIS — Z2821 Immunization not carried out because of patient refusal: Secondary | ICD-10-CM

## 2014-02-05 DIAGNOSIS — K921 Melena: Secondary | ICD-10-CM

## 2014-02-05 DIAGNOSIS — G479 Sleep disorder, unspecified: Secondary | ICD-10-CM

## 2014-02-05 DIAGNOSIS — B009 Herpesviral infection, unspecified: Secondary | ICD-10-CM

## 2014-02-05 LAB — COMPREHENSIVE METABOLIC PANEL
ALT: 22 U/L (ref 0–53)
AST: 18 U/L (ref 0–37)
Albumin: 4.5 g/dL (ref 3.5–5.2)
Alkaline Phosphatase: 47 U/L (ref 39–117)
BUN: 13 mg/dL (ref 6–23)
CO2: 21 mEq/L (ref 19–32)
Calcium: 9.3 mg/dL (ref 8.4–10.5)
Chloride: 104 mEq/L (ref 96–112)
Creat: 0.91 mg/dL (ref 0.50–1.35)
Glucose, Bld: 101 mg/dL — ABNORMAL HIGH (ref 70–99)
Potassium: 3.8 mEq/L (ref 3.5–5.3)
Sodium: 136 mEq/L (ref 135–145)
Total Bilirubin: 0.4 mg/dL (ref 0.2–1.2)
Total Protein: 7.5 g/dL (ref 6.0–8.3)

## 2014-02-05 LAB — POCT URINALYSIS DIPSTICK
Bilirubin, UA: NEGATIVE
Glucose, UA: NEGATIVE
Ketones, UA: NEGATIVE
Leukocytes, UA: NEGATIVE
Nitrite, UA: NEGATIVE
Protein, UA: NEGATIVE
Spec Grav, UA: 1.015
Urobilinogen, UA: NEGATIVE
pH, UA: 6

## 2014-02-05 LAB — LIPID PANEL
Cholesterol: 138 mg/dL (ref 0–200)
HDL: 52 mg/dL (ref >39)
LDL Cholesterol: 72 mg/dL (ref 0–99)
Total CHOL/HDL Ratio: 2.7 Ratio
Triglycerides: 69 mg/dL (ref <150)
VLDL: 14 mg/dL (ref 0–40)

## 2014-02-05 NOTE — Progress Notes (Signed)
Subjective:   HPI  Ryan Herman is a 33 y.o. male who presents for a complete physical.   Preventative care: Last ophthalmology visit: [facility]- last eye exam within the past year Last dental visit:yes Last colonoscopy:n/a Last prostate exam: n/a Last EKG:n/a Last labs:2014  Prior vaccinations: TD or Tdap:2013 Influenza:declined the flu vaccine Pneumococcal:n/a Shingles/Zostavax:n/a  Concerns: Back of tongue swollen up and burning few months ago, and did it again recently.  This is the same side he bites his gum/cheek on the inside at times.  He notes at times awakes like his can't breath or heart stopped.   Has had this happen a few times.  Snores.  Doesn't awake rested.   Has a set bed time, but has trouble getting to sleep, and if awakes in the middle of the night, hard to go back to sleep.  Is sleepy in the day sometimes.  No witnessed apnea.   No prior sleep apnea test.  No prior sleep aids.   Has been drinking more of late, alcohol.   On days he isn't working, drinks a pint.   He notes a few times seeing blood in stool.  Bright red blood.  Has daily BM, doesn't get bloated, not having to strain.    Reviewed their medical, surgical, family, social, medication, and allergy history and updated chart as appropriate.  Past Medical History  Diagnosis Date  . Farsightedness     wears glasses  . Genital herpes     diagnosed 2008  . Obesity   . Seborrheic dermatitis of scalp     [facility]  . Microscopic hematuria 2013    [facility], Dr. [name], negative workup   . Pulmonary nodule seen on imaging study 2013    CT chest 2015, stable, benign, no further eval needed  . Anxiety     mild    Past Surgical History  Procedure Laterality Date  . Wisdom tooth extraction      History   Social History  . Marital Status: Married    Spouse Name: N/A    Number of Children: N/A  . Years of Education: N/A   Occupational History  . drives bus for [employer]     Social History Main Topics  . Smoking status: Former Smoker -- 10 years    Types: Cigarettes    Quit date: 07/31/2008  . Smokeless tobacco: Never Used     Comment: prior black and milds, but not daily  . Alcohol Use: Yes     Comment: used to drink heavy partying in younger years, but not daily. Occasional drink now  . Drug Use: No  . Sexual Activity: Not on file   Other Topics Concern  . Not on file   Social History Narrative   Separated, has 1 daughter 7 yo, exercise - some with walking.  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      Family History  Problem Relation Age of Onset  . Hypertension Mother   . Diabetes Father     complications  . Kidney disease Father     dialysis  . Hypertension Brother   . Cancer Maternal Uncle     prostate  . Stroke Neg Hx   . Heart disease Neg Hx     Current outpatient prescriptions: valACYclovir (VALTREX) 500 MG tablet, Take 1 tablet (500 mg total) by mouth daily., Disp: 30 tablet, Rfl: 11  No Known Allergies   Review of Systems Constitutional: -fever, -chills, -sweats, -unexpected  weight change, -decreased appetite, -fatigue Allergy: -sneezing, -itching, -congestion Dermatology: -changing moles, --rash, +lumps(in his gums) ENT: -runny nose, -ear pain, -sore throat, -hoarseness, -sinus pain, -teeth pain, - ringing in ears, -hearing loss, -nosebleeds Cardiology: -chest pain, -palpitations, -swelling, -difficulty breathing when lying flat, -waking up short of breath Respiratory: -cough, -shortness of breath, -difficulty breathing with exercise or exertion, -wheezing, -coughing up blood Gastroenterology: -abdominal pain, -nausea, -vomiting, -diarrhea, -constipation, +blood in stool, -changes in bowel movement, -difficulty swallowing or eating Hematology: -bleeding, -bruising  Musculoskeletal: -joint aches, -muscle aches, -joint swelling, -back pain, -neck pain, -cramping, -changes in gait Ophthalmology: denies vision changes, eye redness,  itching, discharge Urology: -burning with urination, -difficulty urinating, -blood in urine, -urinary frequency, -urgency, -incontinence Neurology: -headache, -weakness, -tingling, -numbness, -memory loss, -falls, -dizziness Psychology: -depressed mood, -agitation, +sleep problems     Objective:   Physical Exam  BP 130/80 mmHg  Pulse 84  Temp(Src) 98.4 F (36.9 C) (Oral)  Resp 16  Ht 6\' 1"  (1.854 m)  Wt 241 lb (109.317 kg)  BMI 31.80 kg/m2  General appearance: alert, no distress, WD/WN, AA male  Skin: left nare in fold with 3mm raised brown papule umbilicated, unchanged for years per pt, few scattered benign appearing macules , no worrisome lesions  HEENT: normocephalic, conjunctiva/corneas normal, sclerae anicteric, PERRLA, EOMi, nares patent, no discharge or erythema, pharynx normal  Oral cavity: MMM, tongue normal, teeth in good repair, small area of bruising in left buccal mucosa from where he bit his interior of cheek Neck: supple, no lymphadenopathy, no thyromegaly, no masses, normal ROM, no bruits  Chest: non tender, normal shape and expansion  Heart: RRR, normal S1, S2, no murmurs  Lungs: CTA bilaterally, no wheezes, rhonchi, or rales  Abdomen: +bs, soft, non tender, non distended, no masses, no hepatomegaly, no splenomegaly, no bruits  Back: non tender, normal ROM, no scoliosis  Musculoskeletal: upper extremities non tender, no obvious deformity, normal ROM throughout, lower extremities non tender, no obvious deformity, normal ROM throughout  Extremities: no edema, no cyanosis, no clubbing  Pulses: 2+ symmetric, upper and lower extremities, normal cap refill  Neurological: alert, oriented x 3, CN2-12 intact, strength normal upper extremities and lower extremities, sensation normal throughout, DTRs 2+ throughout, no cerebellar signs, gait normal  Psychiatric: normal affect, behavior normal, pleasant  GU: normal male external genitalia, uncircumcised, nontender,  no masses, no hernia, no lymphadenopathy, fatty raised 1cm x 2cm lump of perineum unchanged for many years per patient Rectal: anus nontender, no deformity, no obvious hemorrhoids, occult negative stool   Assessment and Plan :    Encounter Diagnoses  Name Primary?  . Encounter for health maintenance examination in adult Yes  . Obesity   . Herpes   . Influenza vaccine refused   . Screen for STD (sexually transmitted disease)   . Sleep disturbance   . Blood in stool    Physical exam - discussed healthy lifestyle, diet, exercise, preventative care, vaccinations, and addressed their concerns.   Obesity - advised diet and exercise changes to lose weight Herpes-continue medication as needed Declines vaccine today Labs for STD screening today Sleep disturbance-discussed possible causes. Advise he cut down on alcohol intake, work on sleep hygiene as discussed.  17" neck.  He can try Benadryl prn as a sleep aid.  If symptoms continue, may need sleep apnea evaluation. Blood in stool-likely internal hemorrhoids or alcohol related/gastritis. If this continues, paritcualr dark stool, black stool, darker red blood, may need colonoscopy.  He has 1 uncle with  hx/o colon cancer in '50s. Follow-up pending labs

## 2014-02-05 NOTE — Addendum Note (Signed)
Addended by: Leretha DykesSCALES, Araeya Lamb L on: 02/05/2014 11:32 AM   Modules accepted: Orders

## 2014-02-06 LAB — CBC
HCT: 48.1 % (ref 39.0–52.0)
Hemoglobin: 16.4 g/dL (ref 13.0–17.0)
MCH: 30.2 pg (ref 26.0–34.0)
MCHC: 34.1 g/dL (ref 30.0–36.0)
MCV: 88.6 fL (ref 78.0–100.0)
MPV: 9.6 fL (ref 9.4–12.4)
Platelets: 287 10*3/uL (ref 150–400)
RBC: 5.43 MIL/uL (ref 4.22–5.81)
RDW: 14.3 % (ref 11.5–15.5)
WBC: 5.4 10*3/uL (ref 4.0–10.5)

## 2014-02-06 LAB — TSH: TSH: 1.093 u[IU]/mL (ref 0.350–4.500)

## 2014-02-06 LAB — GC/CHLAMYDIA PROBE AMP
CT Probe RNA: NEGATIVE
GC Probe RNA: NEGATIVE

## 2014-02-06 LAB — HEMOGLOBIN A1C
Hgb A1c MFr Bld: 5.7 % — ABNORMAL HIGH (ref <5.7)
Mean Plasma Glucose: 117 mg/dL — ABNORMAL HIGH (ref <117)

## 2014-02-06 LAB — HIV ANTIBODY (ROUTINE TESTING W REFLEX): HIV 1&2 Ab, 4th Generation: NONREACTIVE

## 2014-02-06 LAB — RPR

## 2014-02-09 ENCOUNTER — Other Ambulatory Visit: Payer: Self-pay | Admitting: Medical

## 2014-03-21 ENCOUNTER — Telehealth: Payer: Self-pay | Admitting: Medical

## 2014-03-21 NOTE — Telephone Encounter (Signed)
I know we discussed sleep but I don't think he gave the okay to go forward with a sleep study.    So if he wants a sleep study please refer but reiterate this is either an overnight hospital stay in the sleep lab or at home sleep test.  It certainly doesn't take 30 days to do, it is just a 1 day test  Sorry if there was a miscommunication or confusion

## 2014-03-21 NOTE — Telephone Encounter (Signed)
Pt says Vincenza HewsShane mentions that if he gets a sleep apnea test, it will be done over a course of 30 days. Pt wants to know if he will be put on FMLA when this is done or exactly how that will work.

## 2014-03-24 NOTE — Telephone Encounter (Signed)
LMOM TO CB. CLS 

## 2014-03-25 ENCOUNTER — Other Ambulatory Visit: Payer: Self-pay | Admitting: Family Medicine

## 2014-03-25 DIAGNOSIS — G479 Sleep disorder, unspecified: Secondary | ICD-10-CM

## 2014-03-25 NOTE — Telephone Encounter (Signed)
Patient would like to get a sleep study done. [facility] will contact the patient for his appointment

## 2014-06-03 ENCOUNTER — Ambulatory Visit (HOSPITAL_BASED_OUTPATIENT_CLINIC_OR_DEPARTMENT_OTHER): Payer: BLUE CROSS/BLUE SHIELD | Attending: Medical

## 2014-06-03 VITALS — Ht 73.0 in | Wt 242.0 lb

## 2014-06-03 DIAGNOSIS — R0683 Snoring: Secondary | ICD-10-CM | POA: Insufficient documentation

## 2014-06-03 DIAGNOSIS — G4710 Hypersomnia, unspecified: Secondary | ICD-10-CM | POA: Diagnosis present

## 2014-06-03 DIAGNOSIS — G4733 Obstructive sleep apnea (adult) (pediatric): Secondary | ICD-10-CM | POA: Insufficient documentation

## 2014-06-03 DIAGNOSIS — G479 Sleep disorder, unspecified: Secondary | ICD-10-CM

## 2014-06-05 ENCOUNTER — Telehealth: Payer: Self-pay

## 2014-06-05 NOTE — Telephone Encounter (Signed)
I don't have results.   Please find results.

## 2014-06-05 NOTE — Telephone Encounter (Signed)
Patient went for Sleep Study Tuesday and would like results and referral if indicated. 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 Please advise

## 2014-06-07 DIAGNOSIS — G479 Sleep disorder, unspecified: Secondary | ICD-10-CM | POA: Diagnosis not present

## 2014-06-07 NOTE — Sleep Study (Signed)
   NAME: Ryan BoucheBrandon Herman DATE OF BIRTH:  07/20/1980 MEDICAL RECORD NUMBER [id number]  LOCATION: [facility]  PHYSICIAN: Sakai Wolford Herman  DATE OF STUDY: 06/03/2014  SLEEP STUDY TYPE: Nocturnal Polysomnogram               REFERRING PHYSICIAN: Jac Canavanysinger, David S, PA-C  INDICATION FOR STUDY: Hypersomnia with sleep apnea  EPWORTH SLEEPINESS SCORE:   12/24 HEIGHT: 6\' 1"  (185.4 cm)  WEIGHT: 242 lb (109.77 kg)    Body mass index is 31.93 kg/(m^2).  NECK SIZE: 17.5 in.  MEDICATIONS: Charted for review  SLEEP ARCHITECTURE: Split study protocol. During the diagnostic phase, total sleep time 122 minutes with sleep efficiency 85%. Stage I was 9.4%, stage II 67.6%, stage III absent, REM 23% of total sleep time. Sleep latency 17 minutes, REM latency 75 minutes, awake after sleep onset 4.5 minutes, arousal index 21.1, bedtime medication: None  RESPIRATORY DATA: Apnea hypopnea index (AHI) 26.6 per hour. Successful CPAP titration to 13 CWP, AHI 4.6 per hour wore a large fullface mask.  OXYGEN DATA: Moderately loud snoring with oxygen desaturation to a nadir of 82% and mean saturation 92.4% on room air before CPAP. With CPAP control, snoring was prevented and mean oxygen saturation was 95.1%.  CARDIAC DATA: Sinus rhythm with PVCs  MOVEMENT/PARASOMNIA: No significant movement disturbance, bathroom 1  IMPRESSION/ RECOMMENDATION:   1) Moderate obstructive sleep apnea/hypopnea syndrome, AHI 26.6 per hour, mostly hypopneas. REM AHI 66.4 per hour. Moderately loud snoring with oxygen desaturation to a nadir of 82% on room air. 2) Successful CPAP titration to 13 CWP, AHI 4.6 per hour. He wore a large [employer] Simplus  fullface mask with heated humidifier. Snoring was prevented and mean oxygen saturation was 95.1%.  Ryan Herman,Ryan Herman Diplomate, American Board of Sleep Medicine  ELECTRONICALLY SIGNED ON:  06/07/2014, 2:10 PM [facility] PH: [phone]    FX: [phone] ACCREDITED BY THE AMERICAN ACADEMY OF SLEEP MEDICINE

## 2014-06-09 ENCOUNTER — Telehealth: Payer: Self-pay | Admitting: Medical

## 2014-06-09 NOTE — Telephone Encounter (Signed)
Please refer to home health for CPAP supplies such as [facility]. His sleep study was abnormal and he was titrated and CPAP is recommended  Please see sleep study, the titration information is on the study.  I can't take him out of work for this but please have [facility] get him supplies ASAP so he can start using this right away  I want to see him back in 3-4 weeks once he has started CPAP

## 2014-06-09 NOTE — Telephone Encounter (Signed)
Please call  He went to sleep center last week and he wanted to check on results. Wants to check on being taken out of work until this can be resolved

## 2014-06-11 ENCOUNTER — Telehealth: Payer: Self-pay | Admitting: Family Medicine

## 2014-06-11 NOTE — Telephone Encounter (Signed)
Pt called and req we send a copy of his sleep study to Mr. 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

## 2014-06-11 NOTE — Telephone Encounter (Signed)
Patient is aware of Ryan CoveyShane Tysinger PA message and recommendations I fax over the patients sleep study to [facility]  For Cpap and supplies I called over to [facility] and they said they called him and they left him a message to cb.

## 2014-06-18 ENCOUNTER — Telehealth: Payer: Self-pay | Admitting: Family Medicine

## 2014-06-18 NOTE — Telephone Encounter (Signed)
Pt called and states he cannot afford his cpap.  Please call him with advice [phone]

## 2014-06-18 NOTE — Telephone Encounter (Signed)
Call [facility] and see why they can't work with him?

## 2014-06-19 ENCOUNTER — Telehealth: Payer: Self-pay | Admitting: Medical

## 2014-06-19 NOTE — Telephone Encounter (Signed)
I don't have a good answer.  I sent message today to call [facility] to see if there is not some other way they can help him get CPAP ASAP!!!   Other options include efforts at weight loss.  [place] [address] can make a mouthpiece to help with sleep apnea.  The any other options are to try other home health suppliers to see if they can get him CPAP cheaper, or potentially referral to neurology to see if they have other resources.

## 2014-06-19 NOTE — Telephone Encounter (Signed)
Per tammy patient called back and asked for me to leave message on his VM, I did and I asked him to CB to confirm he understands and received message.

## 2014-06-19 NOTE — Telephone Encounter (Signed)
LM to CB

## 2014-06-19 NOTE — Telephone Encounter (Signed)
Pt requesting help with sleep issues. 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 He is actually working when he called and stopped to call because he was feeling really sleepy. Pt can not afford CPAP and we will not take him out of work so pt wants advice on what to do from this point.

## 2014-06-23 NOTE — Telephone Encounter (Signed)
He can come back in to discuss.  I understand the circumstance, but we could potentially go about this from weight loss perspective if he can't work a solution with [facility].  He could also call other home health companies to see if any cheaper.

## 2014-06-23 NOTE — Telephone Encounter (Signed)
I spoke with the Rep. At [facility] and she states that he hasn't met his deductible yet. He has to meet another $250.00 before the insurance will pay anything towards the CPAP. She states that they carry used machines and they would call him and try to work something out as far a payment agreement.   

## 2014-06-24 NOTE — Telephone Encounter (Signed)
Patient is aware of Kristian CoveyShane Tysinger PA message and the patient is coming in for a OV 5/ 17/16 to discuss weight loss.

## 2014-07-01 ENCOUNTER — Encounter: Payer: Self-pay | Admitting: Medical

## 2014-07-01 ENCOUNTER — Ambulatory Visit (INDEPENDENT_AMBULATORY_CARE_PROVIDER_SITE_OTHER): Payer: BLUE CROSS/BLUE SHIELD | Admitting: Medical

## 2014-07-01 VITALS — BP 120/80 | HR 90 | Resp 15 | Ht 73.0 in | Wt 250.0 lb

## 2014-07-01 DIAGNOSIS — E669 Obesity, unspecified: Secondary | ICD-10-CM | POA: Diagnosis not present

## 2014-07-01 DIAGNOSIS — G4733 Obstructive sleep apnea (adult) (pediatric): Secondary | ICD-10-CM

## 2014-07-01 DIAGNOSIS — R7301 Impaired fasting glucose: Secondary | ICD-10-CM | POA: Diagnosis not present

## 2014-07-01 MED ORDER — PHENTERMINE HCL 37.5 MG PO TABS: 37.5000 mg | ORAL_TABLET | Freq: Every day | ORAL | Status: DC

## 2014-07-01 NOTE — Progress Notes (Signed)
Subjective: Here for f/u on sleep study and obesity.  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  He is a bus Hospital doctordriver.  He is trying to find a way to afford or pay for the CPAP.   He also wants help losing weight as he has had difficulty with weight loss efforts.   He is exercising, trying to eat health.  Would like to pursue medication to help.  Past Medical History  Diagnosis Date  . Farsightedness     wears glasses  . Genital herpes     diagnosed 2008  . Obesity   . Seborrheic dermatitis of scalp     [facility]  . Microscopic hematuria 2013    [facility], Dr. Berneice HeinrichManny, negative workup   . Pulmonary nodule seen on imaging study 2013    CT chest 2015, stable, benign, no further eval needed  . Anxiety     mild   ROS as in subjective  Objective: BP 120/80 mmHg  Pulse 90  Resp 15  Ht 6\' 1"  (1.854 m)  Wt 250 lb (113.399 kg)  BMI 32.99 kg/m2  Wt Readings from Last 3 Encounters:  07/01/14 250 lb (113.399 kg)  06/03/14 242 lb (109.77 kg)  02/05/14 241 lb (109.317 kg)   General appearance: alert, no distress, WD/WN Neck: supple, no lymphadenopathy, no thyromegaly, no masses Heart: RRR, normal S1, S2, no murmurs Lungs: CTA bilaterally, no wheezes, rhonchi, or rales Abdomen: +bs, soft, non tender, non distended, no masses, no hepatomegaly, no splenomegaly Pulses: 2+ symmetric, upper and lower extremities, normal cap refill Ext: no edema   Assessment: Encounter Diagnoses  Name Primary?  . OSA (obstructive sleep apnea) Yes  . Obesity   . Impaired fasting blood sugar     Plan: Reviewed recent sleep study results.  He will work with home health supplier to find a solution, and gave contact info for other home health suppliers if this doesn't work out.  There is really no other way to move forward to keep his job, so needs to start CPAP  now.  Begin Phentermine as appetite suppressant, discussed risks/benefits of medication. Discussed diet, exercise, and strategies for weight loss  F/u 86mo

## 2014-07-02 ENCOUNTER — Telehealth: Payer: Self-pay | Admitting: Medical

## 2014-07-02 NOTE — Telephone Encounter (Signed)
ok 

## 2014-07-02 NOTE — Telephone Encounter (Signed)
I spoke with Misty StanleyLisa at [facility] and she states that someone spoke with [name] today discussing a payment arrangement. Misty StanleyLisa said that they may have discuss some sort of a payment arrangement  but the only person who can approve the arrangement is the manager (Richie) who's not in the office today but will be in tomorrow. Richie is suppose to contact McDermittBrandon tomorrow 07/03/14.
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
## 2014-07-03 ENCOUNTER — Telehealth: Payer: Self-pay

## 2014-07-03 NOTE — Telephone Encounter (Signed)
Patient left message to call him,he was not available when I called so I left a message for him to call back

## 2014-07-04 NOTE — Telephone Encounter (Signed)
I fax over another referral for CPAP machine and supplies to a different company per the patients request.  [facility] fax # [phone]

## 2014-07-18 ENCOUNTER — Telehealth: Payer: Self-pay | Admitting: Medical

## 2014-07-18 NOTE — Telephone Encounter (Signed)
Received fax from [employer] asking for OV notes from May 2016. I have already faxed over the papers needed for the pt.    LJ 6-3

## 2014-07-31 ENCOUNTER — Institutional Professional Consult (permissible substitution): Payer: BLUE CROSS/BLUE SHIELD | Admitting: Medical

## 2014-07-31 ENCOUNTER — Telehealth: Payer: Self-pay | Admitting: Medical

## 2014-07-31 ENCOUNTER — Telehealth: Payer: Self-pay | Admitting: Internal Medicine

## 2014-07-31 NOTE — Telephone Encounter (Signed)
Pt is needing a note stating he can return to work and not come in for an appt today  Pt is using his cpap everyday. He has had it about a month around. He does seem like it is helping as he feels the symptoms he was having has gotten better. I will call [facility] when they open to get the compliance report. Please let me know if pt still needs to come in as he is on the schedule for 11:30am today

## 2014-07-31 NOTE — Telephone Encounter (Signed)
Got compliance report from [facility] today

## 2014-07-31 NOTE — Telephone Encounter (Signed)
Call the home health company that supplies his CPAP and get compliance report.

## 2014-07-31 NOTE — Telephone Encounter (Signed)
Cancelled pt appt for today and compliance report came in from [facility]- gave to shane

## 2014-07-31 NOTE — Telephone Encounter (Signed)
Letter is complete and pt has been notified as well

## 2014-07-31 NOTE — Telephone Encounter (Signed)
CPAP report does show compliance.   Please make note that he is compliant with CPAP and can return to work since improved on CPAP.

## 2014-12-30 ENCOUNTER — Ambulatory Visit: Payer: BLUE CROSS/BLUE SHIELD | Admitting: Medical

## 2015-01-06 ENCOUNTER — Other Ambulatory Visit: Payer: Self-pay | Admitting: Medical

## 2015-02-06 ENCOUNTER — Other Ambulatory Visit: Payer: Self-pay | Admitting: Medical

## 2015-02-06 NOTE — Telephone Encounter (Signed)
Is this ok to refill?  

## 2015-02-10 ENCOUNTER — Ambulatory Visit (INDEPENDENT_AMBULATORY_CARE_PROVIDER_SITE_OTHER): Payer: BLUE CROSS/BLUE SHIELD | Admitting: Medical

## 2015-02-10 ENCOUNTER — Encounter: Payer: Self-pay | Admitting: Medical

## 2015-02-10 VITALS — BP 140/90 | HR 88 | Ht 72.75 in | Wt 256.0 lb

## 2015-02-10 DIAGNOSIS — R7301 Impaired fasting glucose: Secondary | ICD-10-CM | POA: Diagnosis not present

## 2015-02-10 DIAGNOSIS — G4700 Insomnia, unspecified: Secondary | ICD-10-CM | POA: Diagnosis not present

## 2015-02-10 DIAGNOSIS — G4733 Obstructive sleep apnea (adult) (pediatric): Secondary | ICD-10-CM | POA: Diagnosis not present

## 2015-02-10 DIAGNOSIS — E669 Obesity, unspecified: Secondary | ICD-10-CM

## 2015-02-10 DIAGNOSIS — R030 Elevated blood-pressure reading, without diagnosis of hypertension: Secondary | ICD-10-CM | POA: Diagnosis not present

## 2015-02-10 DIAGNOSIS — R5382 Chronic fatigue, unspecified: Secondary | ICD-10-CM

## 2015-02-10 DIAGNOSIS — Z0000 Encounter for general adult medical examination without abnormal findings: Secondary | ICD-10-CM | POA: Insufficient documentation

## 2015-02-10 LAB — CBC
HCT: 46.9 % (ref 39.0–52.0)
Hemoglobin: 15.9 g/dL (ref 13.0–17.0)
MCH: 30.2 pg (ref 26.0–34.0)
MCHC: 33.9 g/dL (ref 30.0–36.0)
MCV: 89 fL (ref 78.0–100.0)
MPV: 9.9 fL (ref 8.6–12.4)
Platelets: 236 10*3/uL (ref 150–400)
RBC: 5.27 MIL/uL (ref 4.22–5.81)
RDW: 14 % (ref 11.5–15.5)
WBC: 5.8 10*3/uL (ref 4.0–10.5)

## 2015-02-10 LAB — COMPREHENSIVE METABOLIC PANEL
ALT: 26 U/L (ref 9–46)
AST: 19 U/L (ref 10–40)
Albumin: 4.3 g/dL (ref 3.6–5.1)
Alkaline Phosphatase: 44 U/L (ref 40–115)
BUN: 16 mg/dL (ref 7–25)
CO2: 28 mmol/L (ref 20–31)
Calcium: 9.2 mg/dL (ref 8.6–10.3)
Chloride: 99 mmol/L (ref 98–110)
Creat: 0.91 mg/dL (ref 0.60–1.35)
Glucose, Bld: 93 mg/dL (ref 65–99)
Potassium: 4 mmol/L (ref 3.5–5.3)
Sodium: 135 mmol/L (ref 135–146)
Total Bilirubin: 0.5 mg/dL (ref 0.2–1.2)
Total Protein: 7.7 g/dL (ref 6.1–8.1)

## 2015-02-10 LAB — POCT URINALYSIS DIPSTICK
Bilirubin, UA: NEGATIVE
Glucose, UA: NEGATIVE
Ketones, UA: NEGATIVE
Leukocytes, UA: NEGATIVE
Nitrite, UA: NEGATIVE
Protein, UA: NEGATIVE
Spec Grav, UA: >=1.03
Urobilinogen, UA: NEGATIVE
pH, UA: 6

## 2015-02-10 LAB — HEMOGLOBIN A1C
Hgb A1c MFr Bld: 5.8 % — ABNORMAL HIGH (ref <5.7)
Mean Plasma Glucose: 120 mg/dL — ABNORMAL HIGH (ref <117)

## 2015-02-10 MED ORDER — ZOLPIDEM TARTRATE 10 MG PO TABS: 10.0000 mg | ORAL_TABLET | Freq: Every evening | ORAL | Status: DC | PRN

## 2015-02-10 NOTE — Progress Notes (Signed)
Subjective:   HPI  Ryan Herman is a 34 y.o. male who presents for a complete physical.   Lately having problems getting to sleep and staying asleep.  Does sometimes watch tv in the bed.   Has diagnosis of OSA, but couldn't seem to tolerate CPAP.   From prior visit didn't see improvement on phentermine.      Reviewed their medical, surgical, family, social, medication, and allergy history and updated chart as appropriate.  Past Medical History  Diagnosis Date  . Farsightedness     wears glasses  . Genital herpes     diagnosed 2008  . Obesity   . Seborrheic dermatitis of scalp     [facility]  . Microscopic hematuria 2013    [facility], Dr. Berneice HeinrichManny, negative workup   . Pulmonary nodule seen on imaging study 2013    CT chest 2015, stable, benign, no further eval needed  . Anxiety     mild    Past Surgical History  Procedure Laterality Date  . Wisdom tooth extraction      Social History   Social History  . Marital Status: Married    Spouse Name: N/A  . Number of Children: N/A  . Years of Education: N/A   Occupational History  . drives bus for [employer]    Social History Main Topics  . Smoking status: Former Smoker -- 10 years    Types: Cigarettes    Quit date: 07/31/2008  . Smokeless tobacco: Never Used     Comment: prior black and milds, but not daily  . Alcohol Use: Yes     Comment: used to drink heavy partying in younger years, but not daily. Occasional drink now  . Drug Use: No  . Sexual Activity: Not on file   Other Topics Concern  . Not on file   Social History Narrative   Separated, has 1 daughter 410 yo, exercise - some with walking.  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      Family History  Problem Relation Age of Onset  . Hypertension Mother   . Diabetes Father     complications  . Kidney disease Father     dialysis  . Hypertension Brother   . Cancer Maternal Uncle     prostate  . Stroke Neg Hx   . Heart disease Neg Hx       Current outpatient prescriptions:  .  valACYclovir (VALTREX) 500 MG tablet, TAKE 1 TABLET (500 MG TOTAL) BY MOUTH DAILY., Disp: 30 tablet, Rfl: 0 .  zolpidem (AMBIEN) 10 MG tablet, Take 1 tablet (10 mg total) by mouth at bedtime as needed for sleep., Disp: 30 tablet, Rfl: 1  No Known Allergies   Review of Systems Constitutional: -fever, -chills, -sweats, -unexpected weight change, -decreased appetite, -fatigue Allergy: -sneezing, -itching, -congestion Dermatology: -changing moles, --rash, +lumps(in his gums) ENT: -runny nose, -ear pain, -sore throat, -hoarseness, -sinus pain, -teeth pain, - ringing in ears, -hearing loss, -nosebleeds Cardiology: -chest pain, -palpitations, -swelling, -difficulty breathing when lying flat, -waking up short of breath Respiratory: -cough, -shortness of breath, -difficulty breathing with exercise or exertion, -wheezing, -coughing up blood Gastroenterology: -abdominal pain, -nausea, -vomiting, -diarrhea, -constipation, +blood in stool, -changes in bowel movement, -difficulty swallowing or eating Hematology: -bleeding, -bruising  Musculoskeletal: -joint aches, -muscle aches, -joint swelling, -back pain, -neck pain, -cramping, -changes in gait Ophthalmology: denies vision changes, eye redness, itching, discharge Urology: -burning with urination, -difficulty urinating, -blood in urine, -urinary frequency, -urgency, -incontinence Neurology: -headache, -  weakness, -tingling, -numbness, -memory loss, -falls, -dizziness Psychology: -depressed mood, -agitation, +sleep problems     Objective:   Physical Exam  BP 140/90 mmHg  Pulse 88  Ht 6' 0.75" (1.848 m)  Wt 256 lb (116.121 kg)  BMI 34.00 kg/m2  General appearance: alert, no distress, WD/WN, AA male  Skin: left nare in fold with 3mm raised brown papule umbilicated, unchanged for years per pt, few scattered benign appearing macules , no worrisome lesions  HEENT: normocephalic, conjunctiva/corneas  normal, sclerae anicteric, PERRLA, EOMi, nares patent, no discharge or erythema, pharynx normal  Oral cavity: MMM, no lesions Neck: supple, no lymphadenopathy, no thyromegaly, no masses, normal ROM, no bruits  Chest: non tender, normal shape and expansion  Heart: RRR, normal S1, S2, no murmurs  Lungs: CTA bilaterally, no wheezes, rhonchi, or rales  Abdomen: +bs, soft, non tender, non distended, no masses, no hepatomegaly, no splenomegaly, no bruits  Back: non tender, normal ROM, no scoliosis  Musculoskeletal: upper extremities non tender, no obvious deformity, normal ROM throughout, lower extremities non tender, no obvious deformity, normal ROM throughout  Extremities: no edema, no cyanosis, no clubbing  Pulses: 2+ symmetric, upper and lower extremities, normal cap refill  Neurological: alert, oriented x 3, CN2-12 intact, strength normal upper extremities and lower extremities, sensation normal throughout, DTRs 2+ throughout, no cerebellar signs, gait normal  Psychiatric: normal affect, behavior normal, pleasant  GU: normal male external genitalia, uncircumcised, nontender, no masses, no hernia, no lymphadenopathy, fatty raised 1cm x 2cm lump of perineum unchanged for many years per patient Rectal: deferred    Assessment and Plan :    Encounter Diagnoses  Name Primary?  . Encounter for health maintenance examination in adult Yes  . Elevated blood-pressure reading without diagnosis of hypertension   . Obesity   . Insomnia   . Impaired fasting blood sugar   . OSA (obstructive sleep apnea)   . Chronic fatigue    Physical exam - discussed healthy lifestyle, diet, exercise, preventative care, vaccinations, and addressed their concerns.   Elevated BP - discussed findings, preventative measures, and advised he again try the CPAP Obesity - advised diet and exercise changes to lose weight Insomnia - discussed sleep hygiene, begin trial of Ambien.  Discussed risks/benefits of the  medication Impaired glucose - labs today OSA - advised he retry again CPAP masks Chronic fatigue - labs today

## 2015-02-11 LAB — TESTOSTERONE: Testosterone: 219 ng/dL — ABNORMAL LOW (ref 300–890)

## 2015-02-12 ENCOUNTER — Other Ambulatory Visit: Payer: Self-pay | Admitting: Medical

## 2015-02-12 MED ORDER — NALTREXONE-BUPROPION HCL ER 8-90 MG PO TB12: 2.0000 | ORAL_TABLET | Freq: Two times a day (BID) | ORAL | Status: DC

## 2015-03-12 ENCOUNTER — Other Ambulatory Visit: Payer: Self-pay | Admitting: Medical

## 2015-03-12 NOTE — Telephone Encounter (Signed)
Is this ok to refill?  

## 2015-04-12 ENCOUNTER — Other Ambulatory Visit: Payer: Self-pay | Admitting: Medical

## 2015-04-13 NOTE — Telephone Encounter (Signed)
Is this ok to refill?  

## 2015-04-13 NOTE — Telephone Encounter (Signed)
Left msg for pt to know about rx

## 2015-04-13 NOTE — Telephone Encounter (Signed)
Change to daily prophylactic use

## 2015-09-23 IMAGING — CT CT CHEST W/ CM
2 of 3 series · 14 of 31 positions shown, 16 images · IV contrast (75CC OMNI 300)
Comparison: 02/06/2013

CLINICAL DATA: F/u lung nodule. No pt complaints. Nonsmoker x 5
yrs. No prev surg or hx ca.

EXAM:
CT CHEST WITH CONTRAST
TECHNIQUE: Multidetector CT imaging of the chest was performed during
intravenous contrast administration.
CONTRAST:  75mL OMNIPAQUE IOHEXOL 300 MG/ML  SOLN

[Series 3: chest with · axial · 0.78mm/px · z∈[-232,-6]mm · 6 of 65 slices shown, 8 images]
[im 10/65  mediastinal]
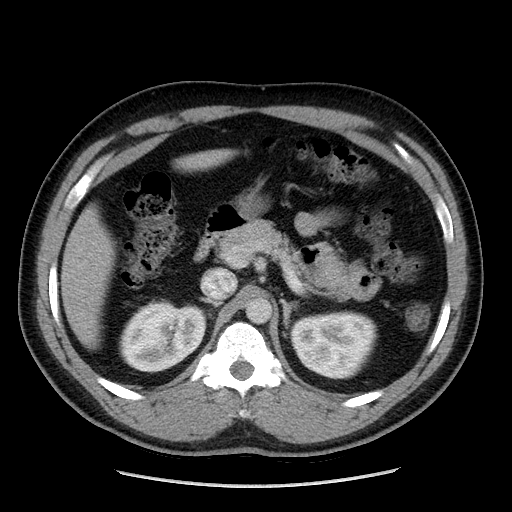
[im 10/65  lung]
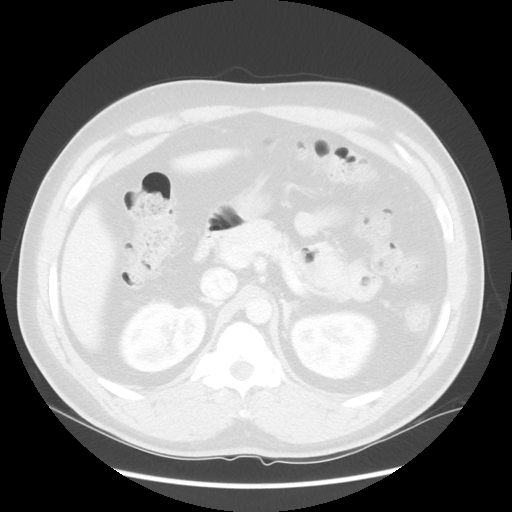
[im 19/65  lung]
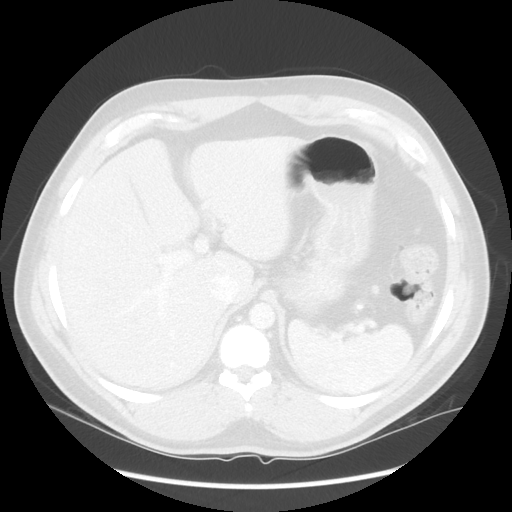
[im 28/65  lung]
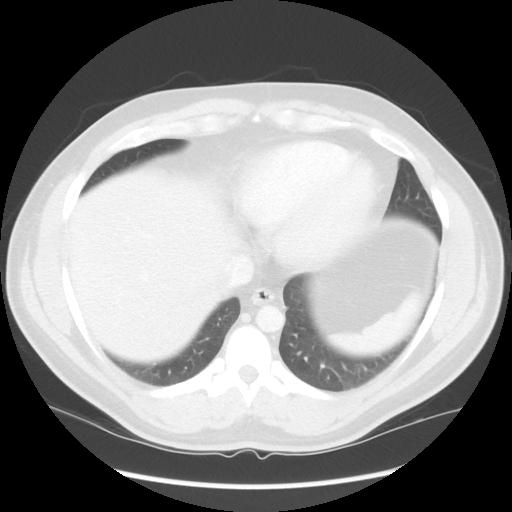
[im 37/65  lung]
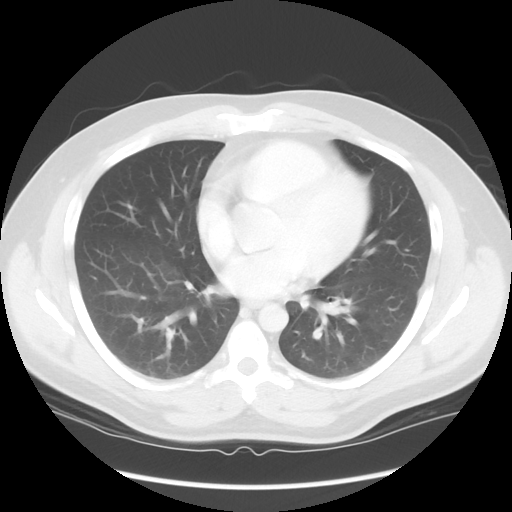
[im 46/65  mediastinal]
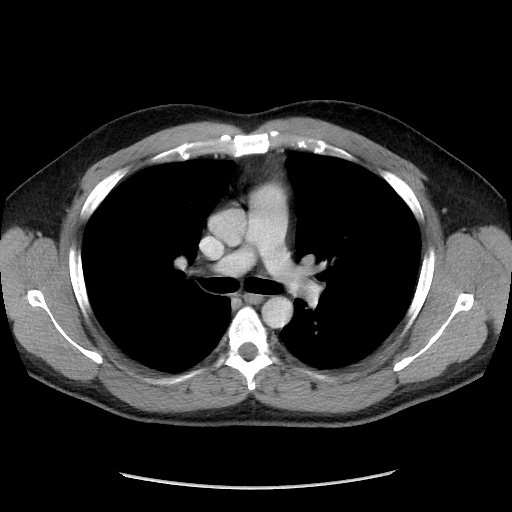
[im 46/65  lung]
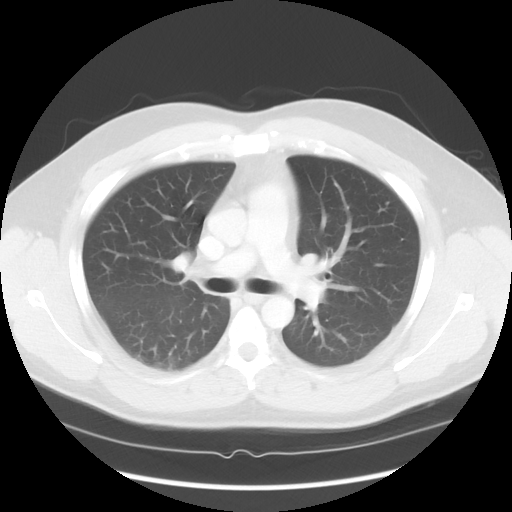
[im 55/65  lung]
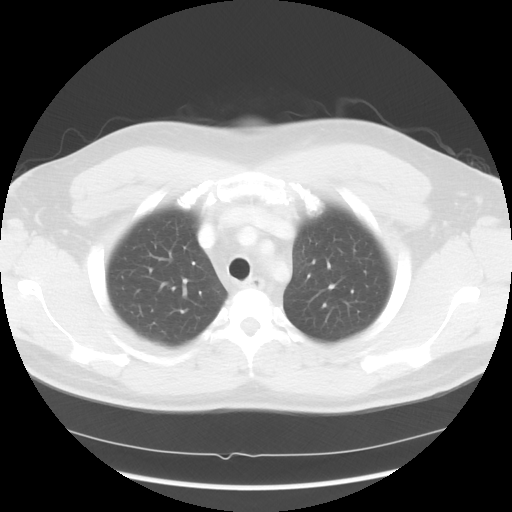

[Series 602: sagittal body · sagittal · 0.78mm/px · 8 of 161 slices shown]
[im 17/161  mediastinal]
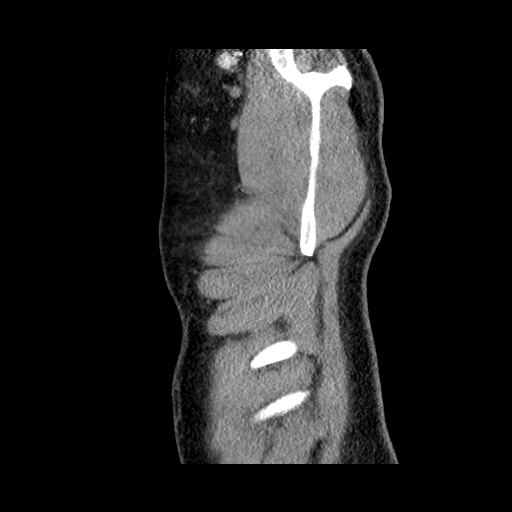
[im 34/161  mediastinal]
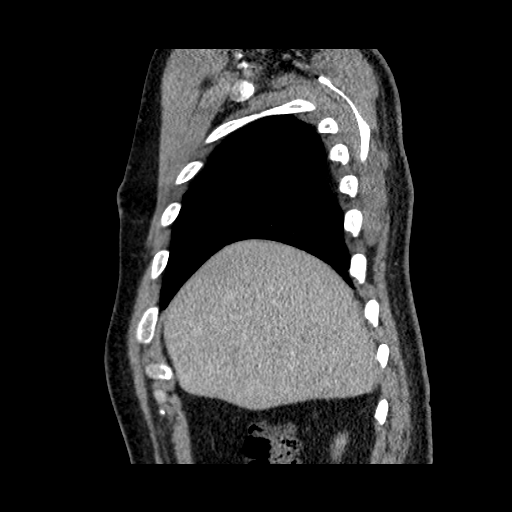
[im 51/161  mediastinal]
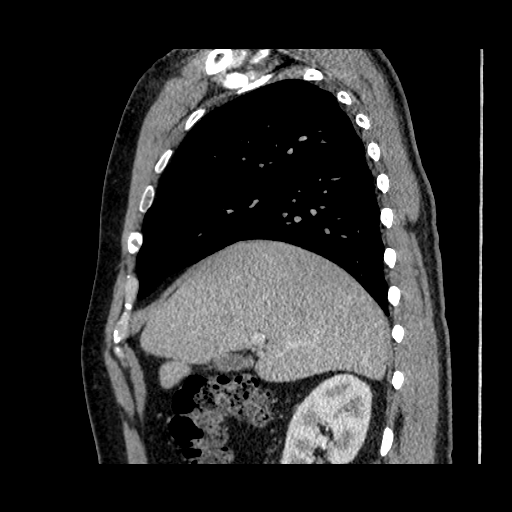
[im 76/161  mediastinal]
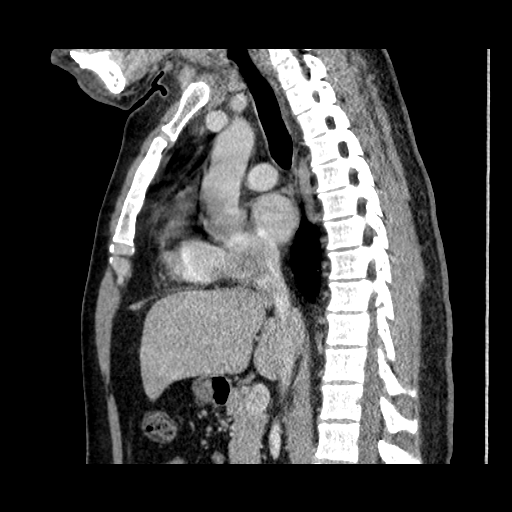
[im 85/161  mediastinal]
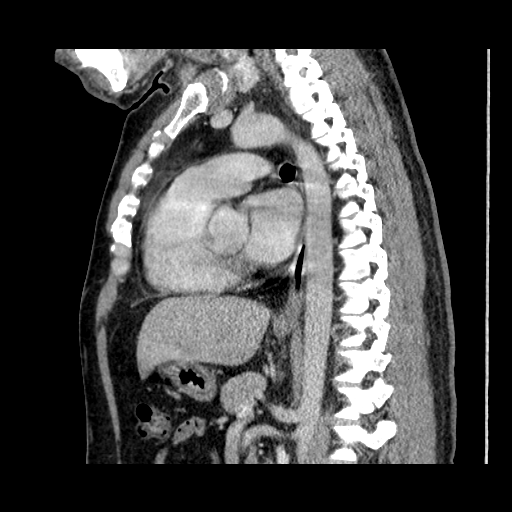
[im 110/161  mediastinal]
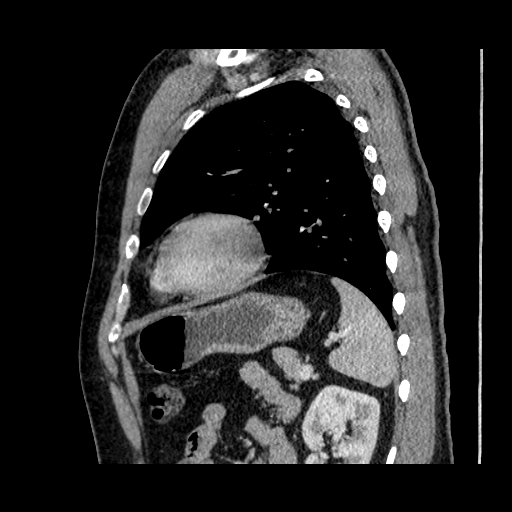
[im 127/161  mediastinal]
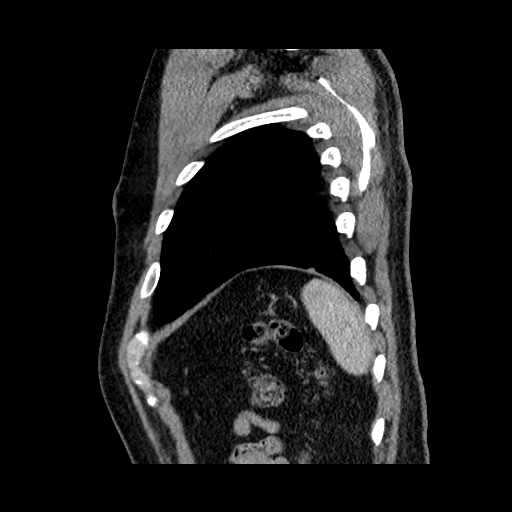
[im 144/161  mediastinal]
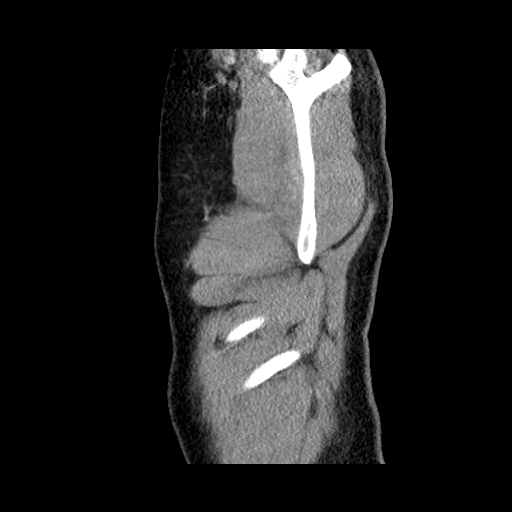

[14 of 31 positions shown; findings below may reference images not displayed]

FINDINGS: Multiple small pulmonary nodules described previously are stable.
The largest is a 5 mm nodule in the left lower lobe at the
diaphragmatic surface.

There are no new nodules. There are no areas of lung consolidation.
No pulmonary edema. There are minor areas is dependent subsegmental
atelectasis.

Heart is normal in size and configuration. No coronary artery
calcifications. The great vessels are normal in caliber. No neck
base, axillary, mediastinal or hilar masses or adenopathy.

Limited visualization of the upper abdomen is unremarkable.

No significant bony abnormality.
IMPRESSION: Stable small pulmonary nodules. Given the size of these nodules, and
their overall appearance and the stability for 1 year, these can be
considered benign with no additional followup indicated.

No acute findings.  Exam otherwise unremarkable.

## 2016-02-17 ENCOUNTER — Encounter: Payer: Self-pay | Admitting: Medical

## 2016-02-17 ENCOUNTER — Ambulatory Visit (INDEPENDENT_AMBULATORY_CARE_PROVIDER_SITE_OTHER): Payer: BLUE CROSS/BLUE SHIELD | Admitting: Medical

## 2016-02-17 VITALS — BP 132/84 | HR 99 | Ht 73.0 in | Wt 275.4 lb

## 2016-02-17 DIAGNOSIS — Z6836 Body mass index (BMI) 36.0-36.9, adult: Secondary | ICD-10-CM

## 2016-02-17 DIAGNOSIS — E669 Obesity, unspecified: Secondary | ICD-10-CM | POA: Diagnosis not present

## 2016-02-17 DIAGNOSIS — G4733 Obstructive sleep apnea (adult) (pediatric): Secondary | ICD-10-CM | POA: Diagnosis not present

## 2016-02-17 DIAGNOSIS — G4700 Insomnia, unspecified: Secondary | ICD-10-CM

## 2016-02-17 DIAGNOSIS — R7301 Impaired fasting glucose: Secondary | ICD-10-CM | POA: Diagnosis not present

## 2016-02-17 DIAGNOSIS — R3129 Other microscopic hematuria: Secondary | ICD-10-CM | POA: Diagnosis not present

## 2016-02-17 DIAGNOSIS — Z9989 Dependence on other enabling machines and devices: Secondary | ICD-10-CM | POA: Diagnosis not present

## 2016-02-17 DIAGNOSIS — Z0000 Encounter for general adult medical examination without abnormal findings: Secondary | ICD-10-CM

## 2016-02-17 LAB — HEMOGLOBIN A1C
Hgb A1c MFr Bld: 5.7 % — ABNORMAL HIGH (ref <5.7)
Mean Plasma Glucose: 117 mg/dL

## 2016-02-17 LAB — TSH: TSH: 0.85 mIU/L (ref 0.40–4.50)

## 2016-02-17 LAB — CBC
HCT: 47.8 % (ref 38.5–50.0)
Hemoglobin: 15.9 g/dL (ref 13.2–17.1)
MCH: 30.5 pg (ref 27.0–33.0)
MCHC: 33.3 g/dL (ref 32.0–36.0)
MCV: 91.6 fL (ref 80.0–100.0)
MPV: 9.7 fL (ref 7.5–12.5)
Platelets: 290 10*3/uL (ref 140–400)
RBC: 5.22 MIL/uL (ref 4.20–5.80)
RDW: 13.5 % (ref 11.0–15.0)
WBC: 6.5 10*3/uL (ref 4.0–10.5)

## 2016-02-17 NOTE — Progress Notes (Signed)
Subjective:   HPI  Story Ryan Herman is a 36 y.o. male who presents for a complete physical.   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   He saw Dr. [name] few years ago for same.   He is compliant with CPAP.   Reviewed their medical, surgical, family, social, medication, and allergy history and updated chart as appropriate.  Past Medical History:  Diagnosis Date  . Anxiety    mild  . Farsightedness    wears glasses  . Genital herpes    diagnosed 2008  . Microscopic hematuria 2013   [facility], Dr. [name], negative workup   . Obesity   . Pulmonary nodule seen on imaging study 2013   CT chest 2015, stable, benign, no further eval needed  . Seborrheic dermatitis of scalp    [facility]    Past Surgical History:  Procedure Laterality Date  . WISDOM TOOTH EXTRACTION      Social History   Social History  . Marital status: Married    Spouse name: N/A  . Number of children: N/A  . Years of education: N/A   Occupational History  . drives bus for [employer]   Social History Main Topics  . Smoking status: Former Smoker    Years: 10.00    Types: Cigarettes    Quit date: 07/31/2008  . Smokeless tobacco: Never Used     Comment: prior black and milds, but not daily  . Alcohol use Yes     Comment: used to drink heavy partying in younger years, but not daily. Occasional drink now  . Drug use: No  . Sexual activity: Not on file   Other Topics Concern  . Not on file   Social History Narrative   Separated, has 1 daughter 68 yo, has a girlfriend.   Exercise - some with walking, some calisthenics.  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   As of 02/2016    Family History  Problem Relation Age of Onset  . Hypertension Mother   . Diabetes Father     complications  . Kidney disease Father     dialysis  . Hypertension Brother   . Cancer Maternal Uncle     prostate  . Stroke Neg Hx   . Heart disease Neg Hx       Current Outpatient Prescriptions:  .  valACYclovir (VALTREX) 500 MG tablet, TAKE 1 TABLET (500 MG TOTAL) BY MOUTH DAILY., Disp: 30 tablet, Rfl: 11  No Known Allergies   Review of Systems Constitutional: -fever, -chills, -sweats, -unexpected weight change, -decreased appetite, -fatigue Allergy: -sneezing, -itching, -congestion Dermatology: -changing moles, --rash, -lumps ENT: -runny nose, -ear pain, -sore throat, -hoarseness, -sinus pain, -teeth pain, - ringing in ears, -hearing loss, -nosebleeds Cardiology: -chest pain, -palpitations, -swelling, -difficulty breathing when lying flat, -waking up short of breath Respiratory: -cough, -shortness of breath, -difficulty breathing with exercise or exertion, -wheezing, -coughing up blood Gastroenterology: -abdominal pain, -nausea, -vomiting, -diarrhea, -constipation, +blood in stool, -changes in bowel movement, -difficulty swallowing or eating Hematology: -bleeding, -bruising  Musculoskeletal: -joint aches, -muscle aches, -joint swelling, -back pain, -neck pain, -cramping, -changes in gait Ophthalmology: denies vision changes, eye redness, itching, discharge Urology: -burning with urination, -difficulty urinating, -blood in urine, -urinary frequency, -urgency, -incontinence Neurology: -headache, -weakness, -tingling, -numbness, -memory loss, -falls, -dizziness Psychology: -depressed mood, -agitation, +sleep problems     Objective:   Physical Exam  BP 132/84   Pulse 99   Ht 6'  1" (1.854 m)   Wt 275 lb 6.4 oz (124.9 kg)   SpO2 97%   BMI 36.33 kg/m   General appearance: alert, no distress, WD/WN, AA male  Skin: left nare in fold with 3mm raised brown papule umbilicated, unchanged for years per pt, few scattered benign appearing macules , no worrisome lesions  HEENT: normocephalic, conjunctiva/corneas normal, sclerae anicteric, PERRLA, EOMi, nares patent, no discharge or erythema, pharynx normal  Oral cavity: MMM, no lesions Neck:  supple, no lymphadenopathy, no thyromegaly, no masses, normal ROM, no bruits  Chest: non tender, normal shape and expansion  Heart: RRR, normal S1, S2, no murmurs  Lungs: CTA bilaterally, no wheezes, rhonchi, or rales  Abdomen: +bs, soft, non tender, non distended, no masses, no hepatomegaly, no splenomegaly, no bruits  Back: non tender, normal ROM, no scoliosis  Musculoskeletal: upper extremities non tender, no obvious deformity, normal ROM throughout, lower extremities non tender, no obvious deformity, normal ROM throughout  Extremities: no edema, no cyanosis, no clubbing  Pulses: 2+ symmetric, upper and lower extremities, normal cap refill  Neurological: alert, oriented x 3, CN2-12 intact, strength normal upper extremities and lower extremities, sensation normal throughout, DTRs 2+ throughout, no cerebellar signs, gait normal  Psychiatric: normal affect, behavior normal, pleasant  GU: normal male external genitalia, uncircumcised, nontender, no masses, no hernia, no lymphadenopathy Rectal: deferred    Assessment and Plan :    Encounter Diagnoses  Name Primary?  . Routine general medical examination at a health care facility Yes  . OSA on CPAP   . Impaired fasting blood sugar   . Insomnia, unspecified type   . Class 2 obesity with body mass index (BMI) of 36.0 to 36.9 in adult, unspecified obesity type, unspecified whether serious comorbidity present   . Microscopic hematuria    Physical exam - discussed healthy lifestyle, diet, exercise, preventative care, vaccinations, and addressed their concerns.   C/t CPAP Obesity - advised diet and exercise changes to lose weight Impaired glucose - labs today hematuria - reviewed 2015 urology notes.   He couldn't give urine sample today.   He will return with urine sample or return to do fresh sample in a few days See your eye doctor yearly for routine vision care. See your dentist yearly for routine dental care including hygiene  visits twice yearly.   Ryan Herman was seen today for physical.  Diagnoses and all orders for this visit:  Routine general medical examination at a health care facility -     Urinalysis Dipstick -     Comprehensive metabolic panel -     Lipid panel -     Hemoglobin A1c -     TSH -     CBC  OSA on CPAP  Impaired fasting blood sugar -     Hemoglobin A1c  Insomnia, unspecified type  Class 2 obesity with body mass index (BMI) of 36.0 to 36.9 in adult, unspecified obesity type, unspecified whether serious comorbidity present -     Hemoglobin A1c  Microscopic hematuria -     CBC

## 2016-02-18 LAB — LIPID PANEL
Cholesterol: 158 mg/dL (ref <200)
HDL: 56 mg/dL (ref >40)
LDL Cholesterol: 78 mg/dL (ref <100)
Total CHOL/HDL Ratio: 2.8 Ratio (ref <5.0)
Triglycerides: 120 mg/dL (ref <150)
VLDL: 24 mg/dL (ref <30)

## 2016-02-18 LAB — COMPREHENSIVE METABOLIC PANEL
ALT: 26 U/L (ref 9–46)
AST: 19 U/L (ref 10–40)
Albumin: 4.5 g/dL (ref 3.6–5.1)
Alkaline Phosphatase: 55 U/L (ref 40–115)
BUN: 17 mg/dL (ref 7–25)
CO2: 20 mmol/L (ref 20–31)
Calcium: 9.5 mg/dL (ref 8.6–10.3)
Chloride: 102 mmol/L (ref 98–110)
Creat: 1.01 mg/dL (ref 0.60–1.35)
Glucose, Bld: 96 mg/dL (ref 65–99)
Potassium: 3.9 mmol/L (ref 3.5–5.3)
Sodium: 138 mmol/L (ref 135–146)
Total Bilirubin: 0.5 mg/dL (ref 0.2–1.2)
Total Protein: 7.4 g/dL (ref 6.1–8.1)

## 2016-02-19 ENCOUNTER — Other Ambulatory Visit: Payer: BLUE CROSS/BLUE SHIELD

## 2016-02-19 DIAGNOSIS — R319 Hematuria, unspecified: Secondary | ICD-10-CM

## 2016-02-19 LAB — POCT URINALYSIS DIPSTICK
Bilirubin, UA: NEGATIVE
Clarity, UA: NEGATIVE
Glucose, UA: NEGATIVE
Ketones, UA: NEGATIVE
Leukocytes, UA: NEGATIVE
Nitrite, UA: NEGATIVE
Protein, UA: NEGATIVE
Spec Grav, UA: >=1.03
Urobilinogen, UA: NEGATIVE
pH, UA: 5

## 2016-02-19 NOTE — Addendum Note (Signed)
Addended by: Winn JockVALENTINE, Lacie Landry N on: 02/19/2016 10:27 AM   Modules accepted: Orders

## 2016-02-20 LAB — URINALYSIS, MICROSCOPIC ONLY
Bacteria, UA: NONE SEEN [HPF]
Casts: NONE SEEN [LPF]
Crystals: NONE SEEN [HPF]
Squamous Epithelial / LPF: NONE SEEN [HPF] (ref <=5)
Yeast: NONE SEEN [HPF]

## 2016-02-22 ENCOUNTER — Other Ambulatory Visit: Payer: BLUE CROSS/BLUE SHIELD

## 2016-03-30 ENCOUNTER — Telehealth: Payer: Self-pay

## 2016-03-30 NOTE — Telephone Encounter (Signed)
Pt called and wanted meds for anxiety , I told he needed to come in for an appt to be seen ,to discuss the more with you. He said that he will call back to make an appt. Because of his work.

## 2016-03-30 NOTE — Telephone Encounter (Signed)
Yes this would require eval/visit

## 2016-04-21 ENCOUNTER — Other Ambulatory Visit: Payer: Self-pay | Admitting: Medical

## 2016-04-25 ENCOUNTER — Ambulatory Visit (INDEPENDENT_AMBULATORY_CARE_PROVIDER_SITE_OTHER): Payer: BLUE CROSS/BLUE SHIELD | Admitting: Medical

## 2016-04-25 ENCOUNTER — Encounter: Payer: Self-pay | Admitting: Medical

## 2016-04-25 VITALS — BP 138/82 | HR 77 | Wt 282.2 lb

## 2016-04-25 DIAGNOSIS — F418 Other specified anxiety disorders: Secondary | ICD-10-CM

## 2016-04-25 MED ORDER — ALPRAZOLAM 0.25 MG PO TABS: 0.2500 mg | ORAL_TABLET | Freq: Two times a day (BID) | ORAL | 0 refills | Status: DC | PRN
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
## 2016-05-17 ENCOUNTER — Telehealth: Payer: Self-pay

## 2016-05-17 NOTE — Telephone Encounter (Signed)
Pt called and said the xanax are not working , he was told by Vincenza Hews that if this was no strong enough to call us back to let us know. He will increase the strength . I explain to him that Vincenza Hews is out of the office, that I will put up an note to see if one of the other provides  Would be will to change, it and call back and wanted to know if he could double up on his meds ,I  l/m on his voicemail to let know he needs to take one pill twice a day. Not to double up on meds.  Per Vickie-NPc he will need to wait until Vincenza Hews comes back. l/m on his voicemail about this.

## 2016-05-19 ENCOUNTER — Telehealth: Payer: Self-pay

## 2016-05-19 MED ORDER — VALACYCLOVIR HCL 500 MG PO TABS
ORAL_TABLET | ORAL | 2 refills | Status: DC
Start: 2016-05-19 — End: 2016-09-08

## 2016-05-19 NOTE — Telephone Encounter (Signed)
PT needs [facility] [place]. Is this ok to renew. Vincenza Hews pt)

## 2016-05-24 ENCOUNTER — Other Ambulatory Visit: Payer: Self-pay

## 2016-05-24 ENCOUNTER — Telehealth: Payer: Self-pay | Admitting: Medical

## 2016-05-24 MED ORDER — ALPRAZOLAM 0.5 MG PO TABS: 0.5000 mg | ORAL_TABLET | Freq: Every evening | ORAL | 0 refills | Status: DC | PRN

## 2016-05-24 NOTE — Telephone Encounter (Signed)
I have called in xanax .5 #15 per jcl left vm

## 2016-05-24 NOTE — Telephone Encounter (Signed)
I had given him a low dose.  He can try 2 at a time the next time he has to use this.   He is using it for situational anxiety so this isn't a daily use, only prn.   So he can double up the next time and let me know how that works.Marland Kitchen

## 2016-05-24 NOTE — Telephone Encounter (Signed)
Pt requesting a higher dose of Xanax because the 0.25 mg does not help as well. He used it before an appointment as instructed and it did not help at all.

## 2016-05-24 NOTE — Telephone Encounter (Signed)
Called in xanax per jcl 

## 2016-05-24 NOTE — Telephone Encounter (Signed)
I put the order in so call it in.

## 2016-05-24 NOTE — Telephone Encounter (Signed)
Called and l/m about this on his voicemail per the pt,because his working.

## 2016-09-08 ENCOUNTER — Other Ambulatory Visit: Payer: Self-pay | Admitting: Family Medicine

## 2016-09-08 NOTE — Telephone Encounter (Signed)
Is this okay to refill? 

## 2016-12-20 ENCOUNTER — Other Ambulatory Visit: Payer: Self-pay | Admitting: Medical

## 2017-01-04 ENCOUNTER — Ambulatory Visit: Payer: BLUE CROSS/BLUE SHIELD | Admitting: Medical

## 2017-01-18 ENCOUNTER — Other Ambulatory Visit: Payer: Self-pay | Admitting: Medical

## 2017-02-13 ENCOUNTER — Other Ambulatory Visit: Payer: Self-pay | Admitting: Medical

## 2017-02-13 ENCOUNTER — Telehealth: Payer: Self-pay | Admitting: Medical

## 2017-02-13 MED ORDER — VALACYCLOVIR HCL 500 MG PO TABS: 500.0000 mg | ORAL_TABLET | Freq: Every day | ORAL | 2 refills | Status: DC

## 2017-02-13 NOTE — Telephone Encounter (Signed)
Rcvd refill request for Valacyclovir 500 mg #90

## 2017-02-24 ENCOUNTER — Ambulatory Visit (INDEPENDENT_AMBULATORY_CARE_PROVIDER_SITE_OTHER): Payer: Commercial Managed Care - PPO | Admitting: Medical

## 2017-02-24 ENCOUNTER — Encounter: Payer: Self-pay | Admitting: Medical

## 2017-02-24 VITALS — BP 138/86 | HR 93 | Ht 72.0 in | Wt 274.2 lb

## 2017-02-24 DIAGNOSIS — R3129 Other microscopic hematuria: Secondary | ICD-10-CM | POA: Diagnosis not present

## 2017-02-24 DIAGNOSIS — G4733 Obstructive sleep apnea (adult) (pediatric): Secondary | ICD-10-CM

## 2017-02-24 DIAGNOSIS — M79672 Pain in left foot: Secondary | ICD-10-CM

## 2017-02-24 DIAGNOSIS — R7301 Impaired fasting glucose: Secondary | ICD-10-CM

## 2017-02-24 DIAGNOSIS — M79671 Pain in right foot: Secondary | ICD-10-CM | POA: Diagnosis not present

## 2017-02-24 DIAGNOSIS — Z9989 Dependence on other enabling machines and devices: Secondary | ICD-10-CM

## 2017-02-24 DIAGNOSIS — M2141 Flat foot [pes planus] (acquired), right foot: Secondary | ICD-10-CM | POA: Diagnosis not present

## 2017-02-24 DIAGNOSIS — Z0000 Encounter for general adult medical examination without abnormal findings: Secondary | ICD-10-CM

## 2017-02-24 DIAGNOSIS — E669 Obesity, unspecified: Secondary | ICD-10-CM | POA: Diagnosis not present

## 2017-02-24 DIAGNOSIS — M2142 Flat foot [pes planus] (acquired), left foot: Secondary | ICD-10-CM | POA: Diagnosis not present

## 2017-02-24 DIAGNOSIS — M2140 Flat foot [pes planus] (acquired), unspecified foot: Secondary | ICD-10-CM | POA: Insufficient documentation

## 2017-02-24 DIAGNOSIS — Z6836 Body mass index (BMI) 36.0-36.9, adult: Secondary | ICD-10-CM | POA: Diagnosis not present

## 2017-02-24 NOTE — Progress Notes (Addendum)
Subjective:   HPI  Ryan Herman is a 37 y.o. male who presents for physical Chief Complaint  Patient presents with  . Annual Exam    physical, no other concerns , fasting labs    Medical care team includes: [name], [name], PA-C here for primary care Dentist Eye doctor   Concerns: Been running on treadmill, trying to get back on track with exercise.   Having some feet pain in soles of feet.    Reviewed their medical, surgical, family, social, medication, and allergy history and updated chart as appropriate.  Past Medical History:  Diagnosis Date  . Anxiety    mild  . Farsightedness    wears glasses  . Genital herpes    diagnosed 2008  . Microscopic hematuria 2013   [facility], Dr. [name], negative workup   . Obesity   . OSA (obstructive sleep apnea)   . Pulmonary nodule seen on imaging study 2013   CT chest 2015, stable, benign, no further eval needed  . Seborrheic dermatitis of scalp    [facility]    Past Surgical History:  Procedure Laterality Date  . WISDOM TOOTH EXTRACTION      Social History   Socioeconomic History  . Marital status: Married    Spouse name: Ryan Herman  . Number of children: Ryan Herman  . Years of education: Ryan Herman  . Highest education level: Ryan Herman  Social Needs  . Financial resource strain: Ryan Herman  . Food insecurity - worry: Ryan Herman  . Food insecurity - inability: Ryan Herman  . Transportation needs - medical: Ryan Herman  . Transportation needs - non-medical: Ryan Herman  Occupational History  . 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 Smoking status: Former Smoker    Years: 10.00    Types: Cigarettes    Last attempt to quit: 07/31/2008    Years since quitting: 8.5  . Smokeless tobacco: Never Used  . Tobacco comment: prior black and milds, but Ryan daily  Substance and Sexual Activity  . Alcohol use: Yes    Comment: used to drink heavy  partying in younger years, but Ryan daily. Occasional drink now  . Drug use: No  . Sexual activity: Ryan Herman  Other Topics Concern  . Ryan Herman  Social History Narrative   Separated, has 1 daughter 95 yo, has a girlfriend.   Exercise - some with walking, some calisthenics.  Drives bus for [employer].   As of 02/2017    Family History  Problem Relation Age of Onset  . Hypertension Mother   . Diabetes Father        complications  . Kidney disease Father        dialysis  . Hypertension Brother   . Cancer Maternal Uncle        prostate  . Hypertension Brother   . Stroke Neg Hx   . Heart disease Neg Hx      Current Outpatient Medications:  .  valACYclovir (VALTREX) 500 MG tablet, Take 1 tablet (500 mg total) by mouth daily., Disp: 30 tablet, Rfl: 2  No Known Allergies     Review of Systems Constitutional: -fever, -chills, -sweats, -unexpected weight change, -decreased appetite, -fatigue Allergy: -sneezing, -itching, -congestion Dermatology: -changing moles, --rash, -lumps ENT: -runny nose, -ear pain, -sore throat, -hoarseness, -sinus pain, -teeth pain, - ringing in ears, -  hearing loss, -nosebleeds Cardiology: -chest pain, -palpitations, -swelling, -difficulty breathing when lying flat, -waking up short of breath Respiratory: -cough, -shortness of breath, -difficulty breathing with exercise or exertion, -wheezing, -coughing up blood Gastroenterology: -abdominal pain, -nausea, -vomiting, -diarrhea, -constipation, -blood in stool, -changes in bowel movement, -difficulty swallowing or eating Hematology: -bleeding, -bruising  Musculoskeletal: -joint aches, -muscle aches, -joint swelling, -back pain, -neck pain, -cramping, -changes in gait Ophthalmology: denies vision changes, eye redness, itching, discharge Urology: -burning with urination, -difficulty urinating, -blood in urine, -urinary frequency, -urgency, -incontinence Neurology: -headache, -weakness, -tingling,  -numbness, -memory loss, -falls, -dizziness Psychology: -depressed mood, -agitation, -sleep problems     Objective:   BP 138/86   Pulse 93   Ht 6' (1.829 m)   Wt 274 lb 3.2 oz (124.4 kg)   SpO2 96%   BMI 37.19 kg/m   General appearance: alert, no distress, WD/WN, African American male Skin: Along the right fold of the nare of nose is a 2 mm diameter pedunculated skin lesion unchanged for years, scattered skin tags of face and neck, no other worrisome skin lesions HEENT: normocephalic, conjunctiva/corneas normal, sclerae anicteric, PERRLA, EOMi, nares patent, no discharge or erythema, pharynx normal Oral cavity: MMM, tongue normal, teeth normal Neck: supple, no lymphadenopathy, no thyromegaly, no masses, normal ROM, no bruits Chest: non tender, normal shape and expansion Heart: RRR, normal S1, S2, no murmurs Lungs: CTA bilaterally, no wheezes, rhonchi, or rales Abdomen: +bs, soft, non tender, non distended, no masses, no hepatomegaly, no splenomegaly, no bruits Back: non tender, normal ROM, no scoliosis Musculoskeletal: bilat flat feet, mild tenderness of soles of feet, otherwise upper extremities non tender, no obvious deformity, normal ROM throughout, lower extremities non tender, no obvious deformity, normal ROM throughout Extremities: no edema, no cyanosis, no clubbing Pulses: 2+ symmetric, upper and lower extremities, normal cap refill Neurological: alert, oriented x 3, CN2-12 intact, strength normal upper extremities and lower extremities, sensation normal throughout, DTRs 2+ throughout, no cerebellar signs, gait normal Psychiatric: normal affect, behavior normal, pleasant  GU: normal male external genitalia, uncircumcised, nontender, no masses, no hernia, no lymphadenopathy Rectal: deferred   Assessment and Plan :    Encounter Diagnoses  Name Primary?  . Encounter for health maintenance examination in adult Yes  . OSA on CPAP   . Impaired fasting blood sugar   .  Microscopic hematuria   . Class 2 obesity with body mass index (BMI) of 36.0 to 36.9 in adult, unspecified obesity type, unspecified whether serious comorbidity present   . Pes planus of both feet   . Pain in both feet     Physical exam - discussed and counseled on healthy lifestyle, diet, exercise, preventative care, vaccinations, sick and well care, proper use of emergency dept and after hours care, and addressed their concerns.    Health screening: See your eye doctor yearly for routine vision care. See your dentist yearly for routine dental care including hygiene visits twice yearly.  Cancer screening Discussed colonoscopy screening age 91yo unless higher risk for earlier screening Discussed PSA, prostate exam, and prostate cancer screening risks/benefits.   Discussed prostate symptoms as well.  Prostate screening performed: No  Discussed monthly self testicular exams  Vaccinations: Counseled on the following vaccines:  Declines flu shot Up to date on Td vaccine  Feet pain -we discussed his foot pain, flat feet, discussed shoe wear, getting better arch support, and if the pain does Ryan get better the next few weeks to let me know  Estiben was  seen today for annual exam.  Diagnoses and all orders for this visit:  Encounter for health maintenance examination in adult -     Comprehensive metabolic panel -     Hemoglobin A1c -     Lipid panel  OSA on CPAP  Impaired fasting blood sugar -     Hemoglobin A1c  Microscopic hematuria  Class 2 obesity with body mass index (BMI) of 36.0 to 36.9 in adult, unspecified obesity type, unspecified whether serious comorbidity present  Pes planus of both feet  Pain in both feet   Follow-up pending labs, yearly for physical

## 2017-02-25 LAB — COMPREHENSIVE METABOLIC PANEL
ALT: 43 IU/L (ref 0–44)
AST: 30 IU/L (ref 0–40)
Albumin/Globulin Ratio: 1.5 (ref 1.2–2.2)
Albumin: 4.9 g/dL (ref 3.5–5.5)
Alkaline Phosphatase: 63 IU/L (ref 39–117)
BUN/Creatinine Ratio: 13 (ref 9–20)
BUN: 13 mg/dL (ref 6–20)
Bilirubin Total: 0.3 mg/dL (ref 0.0–1.2)
CO2: 22 mmol/L (ref 20–29)
Calcium: 9.7 mg/dL (ref 8.7–10.2)
Chloride: 100 mmol/L (ref 96–106)
Creatinine, Ser: 1.04 mg/dL (ref 0.76–1.27)
GFR calc Af Amer: 106 mL/min/{1.73_m2} (ref >59)
GFR calc non Af Amer: 91 mL/min/{1.73_m2} (ref >59)
Globulin, Total: 3.2 g/dL (ref 1.5–4.5)
Glucose: 98 mg/dL (ref 65–99)
Potassium: 4.1 mmol/L (ref 3.5–5.2)
Sodium: 139 mmol/L (ref 134–144)
Total Protein: 8.1 g/dL (ref 6.0–8.5)

## 2017-02-25 LAB — LIPID PANEL
Chol/HDL Ratio: 3.1 ratio (ref 0.0–5.0)
Cholesterol, Total: 162 mg/dL (ref 100–199)
HDL: 52 mg/dL (ref >39)
LDL Calculated: 79 mg/dL (ref 0–99)
Triglycerides: 153 mg/dL — ABNORMAL HIGH (ref 0–149)
VLDL Cholesterol Cal: 31 mg/dL (ref 5–40)

## 2017-02-25 LAB — HEMOGLOBIN A1C
Est. average glucose Bld gHb Est-mCnc: 126 mg/dL
Hgb A1c MFr Bld: 6 % — ABNORMAL HIGH (ref 4.8–5.6)

## 2017-02-27 NOTE — Progress Notes (Signed)
Subjective: Chief Complaint  Patient presents with  . Annual Exam    physical, no other concerns , fasting labs   Here for physical  Medical team: Sees dentist and eye doctor Costas Sena, [name], PA-C here for primary care  Started exercising again to try to get in shape, no other complaint  Been having foot pain in both soles of feet 4 weeks since he has been back on the treadmill  Reviewed their medical, surgical, family, social, medication, and allergy history and updated chart as appropriate.  Past Medical History:  Diagnosis Date  . Anxiety    mild  . Farsightedness    wears glasses  . Genital herpes    diagnosed 2008  . Microscopic hematuria 2013   [facility], Dr. [name], negative workup   . Obesity   . OSA (obstructive sleep apnea)   . Pulmonary nodule seen on imaging study 2013   CT chest 2015, stable, benign, no further eval needed  . Seborrheic dermatitis of scalp    [facility]    Past Surgical History:  Procedure Laterality Date  . WISDOM TOOTH EXTRACTION      Social History   Socioeconomic History  . Marital status: Married    Spouse name: Not on file  . Number of children: Not on file  . Years of education: Not on file  . Highest education level: Not on file  Social Needs  . Financial resource strain: Not on file  . Food insecurity - worry: Not on file  . Food insecurity - inability: Not on file  . Transportation needs - medical: Not on file  . Transportation needs - non-medical: Not on file  Occupational History  . 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 Smoking status: Former Smoker    Years: 10.00    Types: Cigarettes    Last attempt to quit: 07/31/2008    Years since quitting: 8.5  . Smokeless tobacco: Never Used  . Tobacco comment: prior black and milds, but not daily  Substance and Sexual Activity  . Alcohol use: Yes    Comment: used to drink heavy partying in younger  years, but not daily. Occasional drink now  . Drug use: No  . Sexual activity: Not on file  Other Topics Concern  . Not on file  Social History Narrative   Separated, has 1 daughter 57 yo, has a girlfriend.   Exercise - some with walking, some calisthenics.  Drives bus for [employer].   As of 02/2017    Family History  Problem Relation Age of Onset  . Hypertension Mother   . Diabetes Father        complications  . Kidney disease Father        dialysis  . Hypertension Brother   . Cancer Maternal Uncle        prostate  . Hypertension Brother   . Stroke Neg Hx   . Heart disease Neg Hx      Current Outpatient Medications:  .  valACYclovir (VALTREX) 500 MG tablet, Take 1 tablet (500 mg total) by mouth daily., Disp: 30 tablet, Rfl: 2  No Known Allergies     Review of Systems Constitutional: -fever, -chills, -sweats, -unexpected weight change, -decreased appetite, -fatigue Allergy: -sneezing, -itching, -congestion Dermatology: -changing moles, --rash, -lumps ENT: -runny nose, -ear pain, -sore throat, -hoarseness, -sinus pain, -teeth pain, - ringing in ears, -hearing loss, -nosebleeds Cardiology: -chest pain, -  palpitations, -swelling, -difficulty breathing when lying flat, -waking up short of breath Respiratory: -cough, -shortness of breath, -difficulty breathing with exercise or exertion, -wheezing, -coughing up blood Gastroenterology: -abdominal pain, -nausea, -vomiting, -diarrhea, -constipation, -blood in stool, -changes in bowel movement, -difficulty swallowing or eating Hematology: -bleeding, -bruising  Musculoskeletal: -joint aches, -muscle aches, -joint swelling, -back pain, -neck pain, -cramping, -changes in gait Ophthalmology: denies vision changes, eye redness, itching, discharge Urology: -burning with urination, -difficulty urinating, -blood in urine, -urinary frequency, -urgency, -incontinence Neurology: -headache, -weakness, -tingling, -numbness, -memory loss,  -falls, -dizziness Psychology: -depressed mood, -agitation, -sleep problems     Objective:   BP 138/86   Pulse 93   Ht 6' (1.829 m)   Wt 274 lb 3.2 oz (124.4 kg)   SpO2 96%   BMI 37.19 kg/m   General appearance: alert, no distress, WD/WN, African American male Skin: right nasal fold with raised 3mm diameter lesion unchanged for years, no other worrisome lesions HEENT: normocephalic, conjunctiva/corneas normal, sclerae anicteric, PERRLA, EOMi, nares patent, no discharge or erythema, pharynx normal Oral cavity: MMM, tongue normal, teeth normal Neck: supple, no lymphadenopathy, no thyromegaly, no masses, normal ROM, no bruits Chest: non tender, normal shape and expansion Heart: RRR, normal S1, S2, no murmurs Lungs: CTA bilaterally, no wheezes, rhonchi, or rales Abdomen: +bs, soft, non tender, non distended, no masses, no hepatomegaly, no splenomegaly, no bruits Back: non tender, normal ROM, no scoliosis Musculoskeletal: upper extremities non tender, no obvious deformity, normal ROM throughout, lower extremities non tender, no obvious deformity, normal ROM throughout Extremities: no edema, no cyanosis, no clubbing Pulses: 2+ symmetric, upper and lower extremities, normal cap refill Neurological: alert, oriented x 3, CN2-12 intact, strength normal upper extremities and lower extremities, sensation normal throughout, DTRs 2+ throughout, no cerebellar signs, gait normal Psychiatric: normal affect, behavior normal, pleasant  GU: normal male external genitalia,circumcised, nontender, no masses, no hernia, no lymphadenopathy Rectal: deferred   Assessment and Plan :    Encounter Diagnoses  Name Primary?  . Encounter for health maintenance examination in adult Yes  . OSA on CPAP   . Impaired fasting blood sugar   . Microscopic hematuria   . Class 2 obesity with body mass index (BMI) of 36.0 to 36.9 in adult, unspecified obesity type, unspecified whether serious comorbidity present   .  Pes planus of both feet   . Pain in both feet     Physical exam - discussed and counseled on healthy lifestyle, diet, exercise, preventative care, vaccinations, sick and well care, proper use of emergency dept and after hours care, and addressed their concerns.    Health screening: See your eye doctor yearly for routine vision care. See your dentist yearly for routine dental care including hygiene visits twice yearly.  Cancer screening Discussed colonoscopy screening age 37yo unless higher risk for earlier screening Discussed PSA, prostate exam, and prostate cancer screening risks/benefits.   Discussed prostate symptoms as well.  Prostate screening performed: No  Advised monthly self testicular exams  Vaccinations: Counseled on the following vaccines:  Yearly flu shot  Foot pain- advised to get newer shoes with better arch supports, work on stretching daily with the feet for plantar fasciitis, and if not improving in the next several weeks to let me know.  His symptoms seem to be related to flat arches and plantar fasciitis.   [name] was seen today for annual exam.  Diagnoses and all orders for this visit:  Encounter for health maintenance examination in adult -  Comprehensive metabolic panel -     Hemoglobin A1c -     Lipid panel  OSA on CPAP  Impaired fasting blood sugar -     Hemoglobin A1c  Microscopic hematuria  Class 2 obesity with body mass index (BMI) of 36.0 to 36.9 in adult, unspecified obesity type, unspecified whether serious comorbidity present  Pes planus of both feet  Pain in both feet    Follow-up pending labs, yearly for physical

## 2017-05-20 ENCOUNTER — Other Ambulatory Visit: Payer: Self-pay | Admitting: Medical

## 2017-07-12 ENCOUNTER — Ambulatory Visit (INDEPENDENT_AMBULATORY_CARE_PROVIDER_SITE_OTHER): Payer: Commercial Managed Care - PPO | Admitting: Medical

## 2017-07-12 ENCOUNTER — Encounter: Payer: Self-pay | Admitting: Medical

## 2017-07-12 VITALS — BP 140/88 | HR 92 | Temp 98.3°F | Ht 73.5 in | Wt 272.2 lb

## 2017-07-12 DIAGNOSIS — J019 Acute sinusitis, unspecified: Secondary | ICD-10-CM | POA: Diagnosis not present

## 2017-07-12 MED ORDER — AMOXICILLIN-POT CLAVULANATE 875-125 MG PO TABS: 1.0000 | ORAL_TABLET | Freq: Two times a day (BID) | ORAL | 0 refills | Status: DC

## 2017-07-12 NOTE — Progress Notes (Signed)
Subjective:  Ryan Herman is a 37 y.o. male who presents for  Chief Complaint  Patient presents with  . Sinus Problem    heada che, facial pain, blood in mucus, chest congestion x3-4 days    Symptoms include several days of sinus pressure, cough, post nasal drainage, some thick mucoid nasal discharge, some blood tinged mucous discharge, hoarse voice, fatigue.   No fever.  Did vomit once trying to get up phlegm.   Using Mucinex for symptoms.  denies sick contacts.   No other aggravating or relieving factors.  No other complaint.    Past Medical History:  Diagnosis Date  . Anxiety    mild  . Farsightedness    wears glasses  . Genital herpes    diagnosed 2008  . Microscopic hematuria 2013   [facility], Dr. [name], negative workup   . Obesity   . OSA (obstructive sleep apnea)   . Pulmonary nodule seen on imaging study 2013   CT chest 2015, stable, benign, no further eval needed  . Seborrheic dermatitis of scalp    [facility]    Current Outpatient Medications on File Prior to Visit  Medication Sig Dispense Refill  . valACYclovir (VALTREX) 500 MG tablet TAKE 1 TABLET BY MOUTH EVERY DAY 30 tablet 2   No current facility-administered medications on file prior to visit.     ROS as in subjective    Objective: BP 140/88   Pulse 92   Temp 98.3 F (36.8 C) (Oral)   Ht 6' 1.5" (1.867 m)   Wt 272 lb 3.2 oz (123.5 kg)   SpO2 95%   BMI 35.43 kg/m   General appearance: Alert, well developed, well nourished, no distress                             Skin: warm, no rash                           Head: +frontal sinus tenderness,                            Eyes: conjunctiva pink, corneas clear                            Ears: flat left tympanic membrane, flat right tympanic membrane, external ear canals normal                          Nose: septum midline, turbinates swollen, with erythema and clear discharge             Mouth/throat: MMM, tongue normal, mild pharyngeal  erythema                           Neck: supple, no adenopathy, no thyromegaly, non tender                         Lungs:somewhat decreased, no wheezes, no rales, no rhonchi        Assessment  Encounter Diagnosis  Name Primary?  . Acute non-recurrent sinusitis, unspecified location Yes      Plan: Discussed diagnosis of sinusitis.   Discussed usual time frame to see improvement. Discussed possible complications or symptoms that would prompt call back  or recheck within the next few days.     Medications prescribed:  Complete the course of Augmentin antibiotic prescribed today.   Specific home care recommendations discussed:  Decongestant: You may use OTC Guaifenesin (Mucinex plain) for congestion.    Cough suppression: If you have cough, you may use over-the-counter Dextromethorphan (Delsym) as directed on the label  Pain/fever relief: You may use over-the-counter Tylenol for pain or fever  Drink extra fluids. Fluids help thin the mucus so your sinuses can drain more easily.   Use saline nasal sprays to help moisten your sinuses. The sprays can be found at your local drugstore.   Patient was advised to call or return if worse or not improving in the next few days.    Patient voiced understanding of diagnosis, recommendations, and treatment plan.

## 2017-07-12 NOTE — Patient Instructions (Signed)
I am treating you for a sinus infection.  Medications prescribed today: Augmentin antibiotic.   Make sure you complete the course of antibiotics.  Specific home care recommendations today include:  Decongestant: You may use OTC Guaifenesin (Mucinex plain) for congestion.  Cough suppression: If you have cough from drainage, you may use over-the-counter Dextromethorphan (Delsym) as directed on the label  Pain/fever relief: You may use over-the-counter Tylenol for pain or fever  Drink extra fluids. Fluids help thin the mucus so your sinuses can drain more easily.   Applying either moist heat or ice packs to the sinus areas may help relieve discomfort.  Use saline nasal sprays to help moisten your sinuses. The sprays can be found at your local drugstore.   Please call or return if you are worse or not improving in the next 5-7 days.       Sinusitis Sinuses are air pockets within the bones of your face. The growth of bacteria within a sinus leads to infection. Infection keeps the sinuses from draining. This infection is called sinusitis. SYMPTOMS There will be different areas of pain depending on which sinuses have become infected.  The maxillary sinuses often produce pain beneath the eyes.   Frontal sinusitis may cause pain in the middle of the forehead and above the eyes.  Other problems (symptoms) include: 1. Toothaches.  2. Colored, pus-like (purulent) drainage from the nose.  3. Any swelling, warmth, or tenderness over the sinus areas may be signs of infection.  TREATMENT Sinusitis is most often determined by an exam and you may have x-rays taken. If x-rays have been taken, make sure you obtain your results. Or find out how you are to obtain them. Your caregiver may give you medications (antibiotics). These are medications that will help kill the infection. You may also be given a medication (decongestant) that helps to reduce sinus swelling.  HOME CARE INSTRUCTIONS  Only take  over-the-counter or prescription medicines for pain, discomfort, or fever as directed by your caregiver.   Drink extra fluids. Fluids help thin the mucus so your sinuses can drain more easily.   Applying either moist heat or ice packs to the sinus areas may help relieve discomfort.   Use saline nasal sprays to help moisten your sinuses. The sprays can be found at your local drugstore.  SEEK IMMEDIATE MEDICAL CARE IF YOU DEVELOP:  High fever that is still present after two days of antibiotic treatment.   Increasing pain, severe headaches, or toothache.   Nausea, vomiting, or drowsiness.   Unusual swelling around the face or trouble seeing.  MAKE SURE YOU:   Understand these instructions.   Will watch your condition.   Will get help right away if you are not doing well or get worse.  Document Released: 01/31/2005 Document Re-Released: 01/14/2008 [facility] Patient Information 2011 [place], [place].     Using Saline Nose Drops with Bulb Syringe A bulb syringe is used to clear your nose. You may use it when you have a stuffy nose, nasal congestion, sinus pressure, or sneezing.   SALINE SOLUTION You can buy nose drops at your local drug store. You can also make nose drops yourself. Mix 1 cup of water with  teaspoon of salt. Stir. Store this mixture at room temperature. Make a new batch daily.  USE THE BULB IN COMBINATION WITH SALINE NOSE DROPS  Squeeze the air out of the bulb before suctioning the saline mixture.  While still squeezing the bulb flat, place the tip of  the bulb into the saline mixture.  Let air come back into the bulb.  This will suction up the saline mixture.  Gently flush one nostril at a time.  Salt water nose drops will then moisten your  congested nose and loosen secretions before suctioning.  Use the bulb syringe as directed below to suction.  USING THE BULB SYRINGE TO SUCTION  While still squeezing the bulb flat, place the tip of the bulb into a  nostril. Let air come back into the bulb. The suction will pull snot out of the nose and into the bulb.  Repeat on the other nostril.  Squeeze syringe several times into a tissue.  CLEANING THE BULB SYRINGE  Clean the bulb syringe every day with hot soapy water.  Clean the inside of the bulb by squeezing the bulb while the tip is in soapy water.  Rinse by squeezing the bulb while the tip is in clean hot water.  Store the bulb with the tip side down on paper towel.  HOME CARE INSTRUCTIONS   Use saline nose drops often to keep the nose open and not stuffy.  Throw away used salt water. Make a new solution every time.  Do not use the same solution and dropper for another person  If you do not prefer to use nasal saline flush, other options include nasal saline spray or the [employer], both of which are available over the counter at your pharmacy.

## 2017-08-24 ENCOUNTER — Other Ambulatory Visit: Payer: Self-pay | Admitting: Medical

## 2017-09-26 ENCOUNTER — Other Ambulatory Visit: Payer: Self-pay | Admitting: Medical

## 2017-11-13 ENCOUNTER — Telehealth: Payer: Self-pay | Admitting: Medical

## 2017-11-13 NOTE — Telephone Encounter (Signed)
Pt says he never used the weight loss med that he was given. He wants to know if he can get a script for the med again since he has started on a regular exercise routine.

## 2017-11-14 ENCOUNTER — Other Ambulatory Visit: Payer: Self-pay | Admitting: Medical

## 2017-11-14 MED ORDER — PHENTERMINE HCL 37.5 MG PO TABS: 37.5000 mg | ORAL_TABLET | Freq: Every day | ORAL | 0 refills | Status: DC

## 2017-11-14 NOTE — Telephone Encounter (Signed)
Phentermine sent to begin once daily.   This can't be used ongoing every month.  Lets do a month trial.

## 2017-11-15 NOTE — Telephone Encounter (Signed)
Left message on voicemail for patient to call back. 

## 2017-11-15 NOTE — Telephone Encounter (Signed)
Patient notified

## 2017-12-04 ENCOUNTER — Other Ambulatory Visit: Payer: Self-pay | Admitting: Medical

## 2017-12-04 NOTE — Telephone Encounter (Signed)
Is this okay to refill? 

## 2017-12-05 ENCOUNTER — Other Ambulatory Visit: Payer: Self-pay | Admitting: Medical

## 2017-12-05 ENCOUNTER — Telehealth: Payer: Self-pay | Admitting: Medical

## 2017-12-05 MED ORDER — VALACYCLOVIR HCL 500 MG PO TABS: 500.0000 mg | ORAL_TABLET | Freq: Every day | ORAL | 3 refills | Status: DC

## 2017-12-05 NOTE — Telephone Encounter (Signed)
Please call.   I have had a few refill request over recent months for Valtrex.   If he is having frequent outbreaks, he may need to take every day for prevention.   Is that how he is using it, daily for prevention?   If so , I'll change to 90 day supply.

## 2017-12-05 NOTE — Telephone Encounter (Signed)
Patient is using as everyday preventative and he would like the 90 day supply sent in.

## 2017-12-05 NOTE — Telephone Encounter (Signed)
Left message on voicemail for patient to call back. 

## 2018-03-12 ENCOUNTER — Encounter: Payer: Commercial Managed Care - PPO | Admitting: Medical

## 2018-03-19 ENCOUNTER — Encounter: Payer: Self-pay | Admitting: Medical

## 2018-03-19 ENCOUNTER — Ambulatory Visit (INDEPENDENT_AMBULATORY_CARE_PROVIDER_SITE_OTHER): Payer: Commercial Managed Care - PPO | Admitting: Medical

## 2018-03-19 VITALS — BP 150/100 | HR 96 | Temp 98.7°F | Resp 16 | Ht 74.0 in | Wt 270.0 lb

## 2018-03-19 DIAGNOSIS — E669 Obesity, unspecified: Secondary | ICD-10-CM | POA: Diagnosis not present

## 2018-03-19 DIAGNOSIS — R7301 Impaired fasting glucose: Secondary | ICD-10-CM

## 2018-03-19 DIAGNOSIS — Z0000 Encounter for general adult medical examination without abnormal findings: Secondary | ICD-10-CM

## 2018-03-19 DIAGNOSIS — G4733 Obstructive sleep apnea (adult) (pediatric): Secondary | ICD-10-CM

## 2018-03-19 DIAGNOSIS — Z9989 Dependence on other enabling machines and devices: Secondary | ICD-10-CM

## 2018-03-19 DIAGNOSIS — Z6836 Body mass index (BMI) 36.0-36.9, adult: Secondary | ICD-10-CM

## 2018-03-19 DIAGNOSIS — Z2821 Immunization not carried out because of patient refusal: Secondary | ICD-10-CM

## 2018-03-19 NOTE — Progress Notes (Signed)
Subjective: Chief Complaint  Patient presents with  . 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  He has never been on blood pressure medication although his blood pressure has been borderline in the past.  He attributes part of his blood pressure problems to drinking alcohol somewhat regularly.  Drinks alcoholic mixed drinks every day sometimes several a day.  He is exercising some.  He denies chest pain no difficulty breathing no swelling.  He is compliant with CPAP.  Uses [facility].  Reviewed their medical, surgical, family, social, medication, and allergy history and updated chart as appropriate.  Past Medical History:  Diagnosis Date  . Anxiety    mild  . Farsightedness    wears glasses  . Genital herpes    diagnosed 2008  . Microscopic hematuria 2013   [facility], Dr. [name], negative workup   . Obesity   . OSA (obstructive sleep apnea)   . Pulmonary nodule seen on imaging study 2013   CT chest 2015, stable, benign, no further eval needed  . Seborrheic dermatitis of scalp    [facility]    Past Surgical History:  Procedure Laterality Date  . WISDOM TOOTH EXTRACTION      Social History   Socioeconomic History  . Marital status: Married    Spouse name: Not on file  . Number of children: Not on file  . Years of education: Not on file  . Highest education level: Not on file  Occupational History  . 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 Financial resource strain: Not on file  . Food insecurity:    Worry: Not on file    Inability: Not on file  . Transportation needs:    Medical: Not on file    Non-medical: Not on file  Tobacco Use  . Smoking status: Former Smoker    Years: 10.00    Types: Cigarettes    Last attempt to quit: 07/31/2008    Years since quitting: 9.6  .  Smokeless tobacco: Never Used  . Tobacco comment: prior black and milds, but not daily  Substance and Sexual Activity  . Alcohol use: Yes    Alcohol/week: 1.0 standard drinks    Types: 1 Shots of liquor per week    Comment: used to drink heavy partying in younger years, but not daily. Occasional drink now  . Drug use: No  . Sexual activity: Not on file  Lifestyle  . Physical activity:    Days per week: Not on file    Minutes per session: Not on file  . Stress: Not on file  Relationships  . Social connections:    Talks on phone: Not on file    Gets together: Not on file    Attends religious service: Not on file    Active member of club or organization: Not on file    Attends meetings of clubs or organizations: Not on file    Relationship status: Not on file  . Intimate partner violence:    Fear of current or ex partner: Not on file    Emotionally abused: Not on file    Physically abused: Not on file    Forced sexual activity: Not on file  Other Topics Concern  . Not on file  Social History  Narrative   Separated, has 1 daughter 76 yo, has a girlfriend.   Exercise - some with walking, some calisthenics.  Drives bus for [employer].   As of 02/2018    Family History  Problem Relation Age of Onset  . Hypertension Mother   . Diabetes Father        complications  . Kidney disease Father        dialysis  . Hypertension Brother   . Cancer Maternal Uncle        prostate  . Hypertension Brother   . Stroke Neg Hx   . Heart disease Neg Hx      Current Outpatient Medications:  .  valACYclovir (VALTREX) 500 MG tablet, TAKE 1 TABLET BY MOUTH EVERY DAY, Disp: 30 tablet, Rfl: 1 .  phentermine (ADIPEX-P) 37.5 MG tablet, Take 1 tablet (37.5 mg total) by mouth daily before breakfast. (Patient not taking: Reported on 03/19/2018), Disp: 30 tablet, Rfl: 0  No Known Allergies     Review of Systems Constitutional: -fever, -chills, -sweats, -unexpected weight change, -decreased  appetite, -fatigue Allergy: -sneezing, -itching, -congestion Dermatology: -changing moles, --rash, -lumps ENT: -runny nose, -ear pain, -sore throat, -hoarseness, -sinus pain, -teeth pain, - ringing in ears, -hearing loss, -nosebleeds Cardiology: -chest pain, -palpitations, -swelling, -difficulty breathing when lying flat, -waking up short of breath Respiratory: -cough, -shortness of breath, -difficulty breathing with exercise or exertion, -wheezing, -coughing up blood Gastroenterology: -abdominal pain, -nausea, -vomiting, -diarrhea, -constipation, -blood in stool, -changes in bowel movement, -difficulty swallowing or eating Hematology: -bleeding, -bruising  Musculoskeletal: -joint aches, -muscle aches, -joint swelling, -back pain, -neck pain, -cramping, -changes in gait Ophthalmology: denies vision changes, eye redness, itching, discharge Urology: -burning with urination, -difficulty urinating, -blood in urine, -urinary frequency, -urgency, -incontinence Neurology: -headache, -weakness, -tingling, -numbness, -memory loss, -falls, -dizziness Psychology: -depressed mood, -agitation, -sleep problems Male GU: no testicular mass, pain, no lymph nodes swollen, no swelling, no rash.     Objective:  BP (!) 150/100   Pulse 96   Temp 98.7 F (37.1 C) (Oral)   Resp 16   Ht 6\' 2"  (1.88 m)   Wt 270 lb (122.5 kg)   SpO2 98%   BMI 34.67 kg/m   General appearance: alert, no distress, WD/WN, African American male Skin: unremarkable HEENT: normocephalic, conjunctiva/corneas normal, sclerae anicteric, PERRLA, EOMi, nares patent, no discharge or erythema, pharynx normal Oral cavity: MMM, tongue normal, teeth normal Neck: supple, no lymphadenopathy, no thyromegaly, no masses, normal ROM, no bruits Chest: non tender, normal shape and expansion Heart: RRR, normal S1, S2, no murmurs Lungs: CTA bilaterally, no wheezes, rhonchi, or rales Abdomen: +bs, soft, non tender, non distended, no masses, no  hepatomegaly, no splenomegaly, no bruits Back: non tender, normal ROM, no scoliosis Musculoskeletal: upper extremities non tender, no obvious deformity, normal ROM throughout, lower extremities non tender, no obvious deformity, normal ROM throughout Extremities: no edema, no cyanosis, no clubbing Pulses: 2+ symmetric, upper and lower extremities, normal cap refill Neurological: alert, oriented x 3, CN2-12 intact, strength normal upper extremities and lower extremities, sensation normal throughout, DTRs 2+ throughout, no cerebellar signs, gait normal Psychiatric: normal affect, behavior normal, pleasant  GU: normal male external genitalia, nontender, no masses, no hernia, no lymphadenopathy Rectal: deferred  EKG Indication, high blood pressure new diagnosis Rate 88 bpm, PR interval 174 ms, QRS duration 96 ms, QTC 430 ms, 27 degree axis, questionable Q in 3 no prior EKG to compare  Assessment and Plan :   Encounter  Diagnoses  Name Primary?  . Encounter for health maintenance examination in adult Yes  . Impaired fasting blood sugar   . OSA on CPAP   . Class 2 obesity with body mass index (BMI) of 36.0 to 36.9 in adult, unspecified obesity type, unspecified whether serious comorbidity present   . Influenza vaccination declined     Physical exam - discussed and counseled on healthy lifestyle, diet, exercise, preventative care, vaccinations, sick and well care, proper use of emergency dept and after hours care, and addressed their concerns.    Health screening: See your eye doctor yearly for routine vision care. See your dentist yearly for routine dental care including hygiene visits twice yearly.  Declines STD testing.  In monogamous relationship.  Cancer screening  Advised monthly self testicular exam  Vaccinations: Advised yearly influenza vaccine Patient declines influenza vaccine   Acute issues discussed: New diagnosis of HTN -discussing diagnosis of hypertension, possible  complications, we counseled on cutting back on meat, cutting back on salt and alcohol, exercising more, trying to lose weight.  Pending labs begin trial of medication, likely irbesartan or amlodipine.  Separate significant chronic issues discussed: Obesity -work on regular exercise, diet weight loss efforts.  OSA - will get copy of compliance report on CPAP from [facility]   Ryan Herman was seen today for cpe.  Diagnoses and all orders for this visit:  Encounter for health maintenance examination in adult -     Comprehensive metabolic panel -     CBC -     Hemoglobin A1c -     TSH -     Lipid panel -     EKG 12-Lead  Impaired fasting blood sugar -     Hemoglobin A1c  OSA on CPAP  Class 2 obesity with body mass index (BMI) of 36.0 to 36.9 in adult, unspecified obesity type, unspecified whether serious comorbidity present  Influenza vaccination declined   Follow-up pending labs, yearly for physical

## 2018-03-20 ENCOUNTER — Other Ambulatory Visit: Payer: Self-pay | Admitting: Medical

## 2018-03-20 LAB — COMPREHENSIVE METABOLIC PANEL
ALT: 23 IU/L (ref 0–44)
AST: 17 IU/L (ref 0–40)
Albumin/Globulin Ratio: 1.4 (ref 1.2–2.2)
Albumin: 4.3 g/dL (ref 4.0–5.0)
Alkaline Phosphatase: 58 IU/L (ref 39–117)
BUN/Creatinine Ratio: 12 (ref 9–20)
BUN: 13 mg/dL (ref 6–20)
Bilirubin Total: 0.4 mg/dL (ref 0.0–1.2)
CO2: 24 mmol/L (ref 20–29)
Calcium: 9.2 mg/dL (ref 8.7–10.2)
Chloride: 98 mmol/L (ref 96–106)
Creatinine, Ser: 1.11 mg/dL (ref 0.76–1.27)
GFR calc Af Amer: 97 mL/min/{1.73_m2} (ref >59)
GFR calc non Af Amer: 84 mL/min/{1.73_m2} (ref >59)
Globulin, Total: 3 g/dL (ref 1.5–4.5)
Glucose: 99 mg/dL (ref 65–99)
Potassium: 3.9 mmol/L (ref 3.5–5.2)
Sodium: 139 mmol/L (ref 134–144)
Total Protein: 7.3 g/dL (ref 6.0–8.5)

## 2018-03-20 LAB — LIPID PANEL
Chol/HDL Ratio: 2.4 ratio (ref 0.0–5.0)
Cholesterol, Total: 143 mg/dL (ref 100–199)
HDL: 60 mg/dL (ref >39)
LDL Calculated: 62 mg/dL (ref 0–99)
Triglycerides: 103 mg/dL (ref 0–149)
VLDL Cholesterol Cal: 21 mg/dL (ref 5–40)

## 2018-03-20 LAB — CBC
Hematocrit: 48.6 % (ref 37.5–51.0)
Hemoglobin: 16.3 g/dL (ref 13.0–17.7)
MCH: 29.9 pg (ref 26.6–33.0)
MCHC: 33.5 g/dL (ref 31.5–35.7)
MCV: 89 fL (ref 79–97)
Platelets: 310 10*3/uL (ref 150–450)
RBC: 5.45 x10E6/uL (ref 4.14–5.80)
RDW: 12.8 % (ref 11.6–15.4)
WBC: 6.5 10*3/uL (ref 3.4–10.8)

## 2018-03-20 LAB — TSH: TSH: 0.732 u[IU]/mL (ref 0.450–4.500)

## 2018-03-20 LAB — HEMOGLOBIN A1C
Est. average glucose Bld gHb Est-mCnc: 120 mg/dL
Hgb A1c MFr Bld: 5.8 % — ABNORMAL HIGH (ref 4.8–5.6)

## 2018-03-20 MED ORDER — QUINAPRIL HCL 10 MG PO TABS: 10.0000 mg | ORAL_TABLET | Freq: Every day | ORAL | 3 refills | Status: DC

## 2018-04-03 DIAGNOSIS — N390 Urinary tract infection, site not specified: Secondary | ICD-10-CM | POA: Diagnosis not present

## 2018-04-03 DIAGNOSIS — J40 Bronchitis, not specified as acute or chronic: Secondary | ICD-10-CM | POA: Diagnosis not present

## 2018-04-03 DIAGNOSIS — R109 Unspecified abdominal pain: Secondary | ICD-10-CM | POA: Diagnosis not present

## 2018-04-03 DIAGNOSIS — R05 Cough: Secondary | ICD-10-CM | POA: Diagnosis not present

## 2018-04-13 ENCOUNTER — Other Ambulatory Visit (INDEPENDENT_AMBULATORY_CARE_PROVIDER_SITE_OTHER): Payer: Commercial Managed Care - PPO

## 2018-04-13 DIAGNOSIS — Z0000 Encounter for general adult medical examination without abnormal findings: Secondary | ICD-10-CM

## 2018-04-13 LAB — POCT URINALYSIS DIP (PROADVANTAGE DEVICE)
Bilirubin, UA: NEGATIVE
Glucose, UA: NEGATIVE mg/dL
Ketones, POC UA: NEGATIVE mg/dL
Leukocytes, UA: NEGATIVE
Nitrite, UA: NEGATIVE
Protein Ur, POC: NEGATIVE mg/dL
Specific Gravity, Urine: 1.03
Urobilinogen, Ur: NEGATIVE
pH, UA: 6

## 2018-04-16 ENCOUNTER — Other Ambulatory Visit: Payer: Commercial Managed Care - PPO

## 2018-04-30 ENCOUNTER — Other Ambulatory Visit: Payer: Self-pay

## 2018-04-30 ENCOUNTER — Ambulatory Visit (INDEPENDENT_AMBULATORY_CARE_PROVIDER_SITE_OTHER): Payer: Commercial Managed Care - PPO | Admitting: Medical

## 2018-04-30 ENCOUNTER — Encounter: Payer: Self-pay | Admitting: Medical

## 2018-04-30 VITALS — BP 146/100 | HR 80 | Temp 97.9°F | Resp 16 | Ht 74.0 in | Wt 263.2 lb

## 2018-04-30 DIAGNOSIS — G4733 Obstructive sleep apnea (adult) (pediatric): Secondary | ICD-10-CM | POA: Diagnosis not present

## 2018-04-30 DIAGNOSIS — Z9989 Dependence on other enabling machines and devices: Secondary | ICD-10-CM | POA: Diagnosis not present

## 2018-04-30 DIAGNOSIS — I10 Essential (primary) hypertension: Secondary | ICD-10-CM

## 2018-04-30 MED ORDER — AMLODIPINE BESYLATE 10 MG PO TABS: 10.0000 mg | ORAL_TABLET | Freq: Every day | ORAL | 1 refills | Status: DC

## 2018-04-30 NOTE — Progress Notes (Signed)
  Subjective:     Patient ID: Ryan Herman, male   DOB: Mar 23, 1980, 38 y.o.   MRN: [id number]  HPI Chief Complaint  Patient presents with  . follow up    follow up HTN   Here for hypertension follow-up.  At his last visit in February we initiated medication quinapril 10 mg daily.  He is taking the medication.  He reports an intermittent dry cough.  Not checking home BPs.  He has sleep apnea and compliant with CPAP.  I received a CPAP compliance report showing 98% usage 88 at a 90 days, 66% usage greater than 4 hours, 32% usage less than 4 hours  No other complaint  Review of Systems As in subjective     Objective:   Physical Exam BP (!) 146/100   Pulse 80   Temp 97.9 F (36.6 C) (Oral)   Resp 16   Ht 6\' 2"  (1.88 m)   Wt 263 lb 3.2 oz (119.4 kg)   SpO2 95%   BMI 33.79 kg/m   Wt Readings from Last 3 Encounters:  04/30/18 263 lb 3.2 oz (119.4 kg)  03/19/18 270 lb (122.5 kg)  07/12/17 272 lb 3.2 oz (123.5 kg)   BP Readings from Last 3 Encounters:  04/30/18 (!) 146/100  03/19/18 (!) 150/100  07/12/17 140/88   General appearance: alert, no distress, WD/WN,  Neck: supple, no lymphadenopathy, no thyromegaly, no masses Heart: RRR, normal S1, S2, no murmurs Lungs: CTA bilaterally, no wheezes, rhonchi, or rales Pulses: 2+ symmetric, upper and lower extremities, normal cap refill Ext: no edema      Assessment:     Encounter Diagnoses  Name Primary?  . Essential hypertension, benign Yes  . OSA on CPAP        Plan:     Stop quinapril, begin amlodipine.  Discussed risk and benefits of medication.  Advised to start checking his pressures at home to get readings to compare.  Glad to see he is lost weight.  Continue healthy diet changes and exercise.  Advised to give me blood pressure readings in 2 weeks.  Advise he contact his home health company to evaluate to see if he needs to change anything about his CPAP mask.  Layke was seen today for follow up.  Diagnoses  and all orders for this visit:  Essential hypertension, benign  OSA on CPAP  Other orders -     amLODipine (NORVASC) 10 MG tablet; Take 1 tablet (10 mg total) by mouth daily.

## 2018-05-08 ENCOUNTER — Telehealth: Payer: Self-pay

## 2018-05-08 NOTE — Telephone Encounter (Signed)
BP is not going down, sleep apnea acting up, stress up, single parent and with school being out he is having a lot of stress and wants to know if you can give him FMLA for a little while until he can get things straightened out with everything.  Please advise or call him back.

## 2018-05-08 NOTE — Telephone Encounter (Signed)
1- he has an appt tomorrow at 11:30am so he will have his phone available to talk 2- he might be able to send bp reading through mychart. 3- he has not spoke with home health supplier yet since he has been so busy with his daughter since she is out of school 4. He is having to use his vacation time right now. He has this week and alittle bit of days next week and then he will be out of time. He doesn't want to go into work cause he drives the city bus and with his bp being up and his stress level and health he doesn't want to be behind the wheel. He is stressing cause he is having to take off work, make sure his daughter is doing her work and having to watch over her. So he was wondering if you can do FMLA for him which can be discuss over phone tomorrow.

## 2018-05-08 NOTE — Telephone Encounter (Signed)
1-suggest a virtual telephone encounter 2- see if he has several recent blood pressure readings that he can send me through my chart? 3-did he get a hold of his home health supplier to look into CPAP issues? 4-regarding FMLA/Tylenol, has he been asked to stay home from work for work from home?  What is he doing about work currently?  Or is he considering a leave of absence for a period of weeks?

## 2018-05-09 ENCOUNTER — Telehealth (INDEPENDENT_AMBULATORY_CARE_PROVIDER_SITE_OTHER): Payer: Commercial Managed Care - PPO | Admitting: Medical

## 2018-05-09 ENCOUNTER — Encounter: Payer: Self-pay | Admitting: Medical

## 2018-05-09 ENCOUNTER — Telehealth: Payer: Self-pay | Admitting: Medical

## 2018-05-09 ENCOUNTER — Other Ambulatory Visit: Payer: Self-pay

## 2018-05-09 VITALS — Ht 73.0 in | Wt 263.0 lb

## 2018-05-09 DIAGNOSIS — Z636 Dependent relative needing care at home: Secondary | ICD-10-CM | POA: Diagnosis not present

## 2018-05-09 DIAGNOSIS — G4733 Obstructive sleep apnea (adult) (pediatric): Secondary | ICD-10-CM | POA: Diagnosis not present

## 2018-05-09 DIAGNOSIS — G4700 Insomnia, unspecified: Secondary | ICD-10-CM

## 2018-05-09 DIAGNOSIS — F430 Acute stress reaction: Secondary | ICD-10-CM | POA: Diagnosis not present

## 2018-05-09 DIAGNOSIS — I10 Essential (primary) hypertension: Secondary | ICD-10-CM | POA: Diagnosis not present

## 2018-05-09 DIAGNOSIS — Z9989 Dependence on other enabling machines and devices: Secondary | ICD-10-CM

## 2018-05-09 NOTE — Progress Notes (Signed)
Documentation for WebEx virtual encounter:  This virtual encounter is not related to other E/M service within previous 7 days.  Patient consented to the consult.  This consult involved patient and myself, Catering manager PA-C.  Subjective: Calling in with concerns about high blood pressure, stressed with coronavirus scare, under a lot of stress.  Has daughter in middle school, trying to help her with school since school.    Having to exhaust vacation time now, but next week wont have income coming.   Works driving city bus.    HTN - taking Amlodipine, checking BPs.  162/104, 167/104, 163/110.  In afternoon was 147/90.  156/107.    OSA - using CPAP.  Having some trouble getting comfortable at night.    Not sleeping well, restless, worried about the next few weeks.  He has no one else to help take care of his daughter.   Regarding FMLA, requesting a few weeks time covered as employer wont let them come to work currently, and he has no one else to take care of his child the next few weeks until coronavirus scare is calmed down and people get back to work.    Objective: Gen: answers questions appropriately, no acute distress but seems stressed.   Assessment: Encounter Diagnoses  Name Primary?  . Essential hypertension, benign Yes  . Acute stress reaction   . Caregiver burden   . OSA on CPAP   . Insomnia, unspecified type       Plan: We discussed his concerns, blood pressure, sleep apnea, insomnia, stress.  Recommendations Hypertension-continue amlodipine 10 mg daily.  Work on stress reduction.  Work on [employer] and exercise.  He declines to add on any medication at this time as he attributes the higher recent readings to stress.  I asked him to monitor his blood pressure and getting readings in 2 weeks.  Discussed goal blood pressure reading 130/80 or less  Acute stress reaction- try to give him some encouragement that we are all in this together and that we all have to  adjust our schedules and way of life currently with the coronavirus care.  Discussed ways to cope  OSA-continue CPAP.  Caregiver burden- continue efforts to help with daughter schoolwork and supervision.  I will work on [employer] form once he gets the paperwork to me.  Advised to contact human resources to see if they have some specific guidelines as he is not the only employed this can be involved with being out of work having to await the stay at home guidelines to be lifted.  If he has no current guidance from HR I will write him out for 3 weeks given the above diagnoses.  They were advised to follow up with FMLA paperwork ASAP, and send me BP readings in 2 weeks .   Time involving medical discussion was 25 minutes.

## 2018-05-09 NOTE — Progress Notes (Signed)
Wanted to see if he could get FMLA due to bp and sleep apnea. Also dealing with a lot of stress due to being a single parent and having his middle school daughter at home.

## 2018-05-09 NOTE — Telephone Encounter (Signed)
FMLA paperwork was faxed over put in cyndi folder

## 2018-05-09 NOTE — Patient Instructions (Signed)
Recommendations  High blood pressure-continue amlodipine.  Monitor your blood pressures and get me readings in 2 weeks.  The goal is 130/80 or less  Sleep apnea on CPAP-contact your home health company if you need adjustments with the CPAP  Talk to your human resources department about the FMLA forms and what their guidelines are right now to help their employees who are displaced out of work until the stay at home recommendations are lifted.  If they do not have a specific, right now then get me FMLA forms and I will try to help out with this.  This is a tough time for everybody.  Work with the teachers with the online resources they have given to help your child stay on top of their education, use this opportunity to bond and spend quality time with your child.  I this is tough, and I sympathize with your situation being a single parent.  Use exercise, listening music, spending time in God's word, watching some comedy on YouTube and other ways to decrease stress

## 2018-05-14 ENCOUNTER — Telehealth: Payer: Self-pay

## 2018-05-14 NOTE — Telephone Encounter (Signed)
Can you find out about other question.   I turned in the FMLA today to be sent

## 2018-05-14 NOTE — Telephone Encounter (Signed)
Patient called and left message on voicemail stating that he wanted to know if you have received FMLA paperwork for him and he also wanted you to call him to discuss other questions he has.

## 2018-05-15 NOTE — Telephone Encounter (Signed)
Patient's work has put a Tour manager in effect for people who have to be out of work.  I told him that we have sent in the [facility] paperwork in.

## 2018-06-23 ENCOUNTER — Other Ambulatory Visit: Payer: Self-pay | Admitting: Medical

## 2018-07-06 DIAGNOSIS — G4733 Obstructive sleep apnea (adult) (pediatric): Secondary | ICD-10-CM | POA: Diagnosis not present

## 2018-08-06 ENCOUNTER — Ambulatory Visit (INDEPENDENT_AMBULATORY_CARE_PROVIDER_SITE_OTHER): Payer: Commercial Managed Care - PPO | Admitting: Medical

## 2018-08-06 ENCOUNTER — Encounter: Payer: Self-pay | Admitting: Medical

## 2018-08-06 ENCOUNTER — Other Ambulatory Visit: Payer: Self-pay

## 2018-08-06 VITALS — Wt 263.0 lb

## 2018-08-06 DIAGNOSIS — Z636 Dependent relative needing care at home: Secondary | ICD-10-CM

## 2018-08-06 DIAGNOSIS — F419 Anxiety disorder, unspecified: Secondary | ICD-10-CM | POA: Diagnosis not present

## 2018-08-06 DIAGNOSIS — I10 Essential (primary) hypertension: Secondary | ICD-10-CM

## 2018-08-06 NOTE — Patient Instructions (Signed)
RESOURCES in [place], [place]  If you are experiencing a mental health crisis or an emergency, please call 911 or go to the nearest emergency department.  [facility]   [phone] [facility]  [phone] [facility]   [phone]  Suicide Hotline 1-800-Suicide (1-800-784-2433)  National Suicide Prevention Lifeline 1-800-273-TALK  (1-800-273-8255)  Domestic Violence, Rape/Crisis - [facility] 336-273-7273  The National Domestic Violence Hotline 1-800-799-SAFE (1-800-799-7233)  To report Child or Elder Abuse, please call: [place] Police Department  [phone] [place] Sherriff Department  [phone]  LGBT Youth Crisis Line 1-866-488-7386  Teen Crisis line 336-387-6161 or 1-877-332-7333     Psychiatry and Counseling services  [facility] [address], [place], [place] [address] [phone]  Holly Ingram, therapist Dr. Carey Cottle, psychiatrist Dr. Glenn Jennings, child psychiatrist   Dr. David Fuller [address], [place], [place] [address] [phone]   Dr. Rupinder Kaur, psychiatry [address], [place], [place] [address] [phone]   [facility] [address], [place], [place] [address] [phone]   [facility] [address], [place], [place] [address] [phone]    Counseling Services (NON- psychiatrist offices)  [facility] [address], [place], [place] [address] [phone]   [facility] [phone] [address], [place], [place] [address]   [facility] [phone]  [email or web address] [address], [place], [place] [address]   Merrianne M. Leff, therapist [phone] [address] [place], [place] [address]   [facility] [phone] [address], [place], [place]  [address]   Jill White-Huffman, therapist [phone] [address], , [place] [address]   [facility] [phone] [address], ,  [address]   

## 2018-08-06 NOTE — Progress Notes (Signed)
This visit type was conducted due to national recommendations for restrictions regarding the COVID-19 Pandemic (e.g. social distancing) in an effort to limit this patient's exposure and mitigate transmission in our community.  This format is felt to be most appropriate for this patient at this time.    Documentation for virtual audio and video telecommunications through Zoom encounter:  The patient was located at home. The provider was located in the office. The patient did consent to this visit and is aware of possible charges through their insurance for this visit.  The other persons participating in this telemedicine service were none. Time spent on call was 15 minutes and in review of previous records >15 minutes total.  This virtual service is not related to other E/M service within previous 7 days.  Subjective: Chief Complaint  Patient presents with  . follow up    follow up on time out of work   Here for f/u.  I last spoke to him back in March regarding stress and anxiety, blood pressure.  At that time he was anxious about work, about coronavirus, being a single parent and dealing with school closure , helping his child deal with school work.  At that time we are also dealing with uncontrolled blood pressure.  He had been reluctant to start quinapril blood pressure medication.  He is calling today to get help on extension of time at work as she is supposed to go back to work this Thursday.  He is still having a lot of anxiety.  He has to his friend just got admitted to the hospital for COVID-19, his coworker.  He still is having stress as a single parent.  His blood pressures are looking better.  He is having trouble focusing.  Still feels anxious a lot.  He is not currently seeing a Veterinary surgeoncounselor.  After last visit his employer had some funding that allowed him coverage out of work for a period of time.  No other aggravating or relieving factors. No other complaint.  Past Medical History:   Diagnosis Date  . Anxiety    mild  . Farsightedness    wears glasses  . Genital herpes    diagnosed 2008  . Microscopic hematuria 2013   [facility], Dr. Berneice HeinrichManny, negative workup   . Obesity   . OSA (obstructive sleep apnea)   . Pulmonary nodule seen on imaging study 2013   CT chest 2015, stable, benign, no further eval needed  . Seborrheic dermatitis of scalp    [facility]   Current Outpatient Medications on File Prior to Visit  Medication Sig Dispense Refill  . amLODipine (NORVASC) 10 MG tablet TAKE 1 TABLET BY MOUTH EVERY DAY 30 tablet 1  . quinapril (ACCUPRIL) 10 MG tablet Take 10 mg by mouth daily.    . valACYclovir (VALTREX) 500 MG tablet TAKE 1 TABLET BY MOUTH EVERY DAY 30 tablet 1   No current facility-administered medications on file prior to visit.    ROS as in subjective    Objective Wt 263 lb (119.3 kg)   BMI 34.70 kg/m   Gen: wd,wn,nad Due to covid19 measures, not examined in person,     Assessment: Encounter Diagnoses  Name Primary?  Marland Kitchen. Anxiety Yes  . Caregiver burden   . Essential hypertension, benign      Plan: We discussed his concerns.  Interestingly looking back in his chart it appears he establish with a different primary care office.  He says he had to go  to another office for labs and blood pressure eval.  I am not sure if this was for a second opinion and he does not want to admit this or if he was unhappy with my care.  I am not sure.  I am glad to see his blood pressure is under better control at the moment.  He was reluctant to start blood pressure medication back in March.  He needs to work on lifestyle changes with diet and exercise in regards to blood pressure and sleep apnea  Regarding anxiety and stress, I advised he establish with a counselor right away.  He was requesting time out of work.  I gave him this week at work to give him time to establish with a counselor and to get some things in order.  I advised that I cannot  write him out of work any other amount of time as I am not a mental health provider.  He will have to discuss this with mental health provider.  We did discuss some ways to cope and deal with anxiety.  I advised that he cannot have 2 different primary care providers, that he would need to make a decision on this   Ryan Herman was seen today for follow up.  Diagnoses and all orders for this visit:  Anxiety  Caregiver burden  Essential hypertension, benign

## 2018-08-07 ENCOUNTER — Telehealth: Payer: Self-pay | Admitting: Medical

## 2018-08-07 NOTE — Telephone Encounter (Signed)
My [place] appt SAME DAY.  He also reportedly has access to employee assistance program.  Regarding Ryan Herman in [place], if prefers that appt, refer if that is what is needed.     I can't write him out of work through next Thursday.  I can write him out of work through next Monday only.

## 2018-08-07 NOTE — Telephone Encounter (Signed)
Patient called back and left message on voicemail that his email address is [email or web address].   And his letter to be out of work needs to be emailed to [email or web address].

## 2018-08-07 NOTE — Telephone Encounter (Signed)
Patient called and states that he needs a work note from this Thursday to next Thursday for his issues going on. He is suppose to go back Thursday but he works saturdays too and he says its too soon.  He is trying to get in to see someone for an health evaluation due to his anxiety. He has called [facility] and they are booked out until July and then he said [facility] in [place] can see him but states he needs a referral

## 2018-08-08 ENCOUNTER — Encounter: Payer: Self-pay | Admitting: Family Medicine

## 2018-08-08 ENCOUNTER — Telehealth: Payer: Self-pay | Admitting: Family Medicine

## 2018-08-08 NOTE — Telephone Encounter (Signed)
Forwarding to you :)

## 2018-08-08 NOTE — Telephone Encounter (Signed)
Corrected email for HR [email or web address]

## 2018-08-08 NOTE — Telephone Encounter (Signed)
Pt called, he never received the AVS with list of providers.  Marguarite Arbour advised she tried to send it to him, but he would not give her the email address.    Pt work note for this week needs to cover him through Saturday, I advised that would be ok.  His normal days off are Sun and Mon.  He kept trying to say he also needed a note through next Thursday.  I explained, that would come from the behavioral health provider.    Sent AVS to him [email or web address], he will work on appt today and advise.  He had called [facility] and has appt for July 6th.  Will send oow letter to his employer.

## 2018-08-09 ENCOUNTER — Telehealth: Payer: Self-pay | Admitting: Family Medicine

## 2018-08-09 ENCOUNTER — Ambulatory Visit: Payer: Commercial Managed Care - PPO | Admitting: Psychology

## 2018-08-09 NOTE — Telephone Encounter (Signed)
Pt emailed he had reached out to people on the list.  He is waiting for call backs, some would not take his insurance, some could see him in Aug or Sept.  [facility] will no longer take walk in due to Covid.  His appt with them is July 6th at 11:30.  He states no one can see him any sooner.  Please advise patient.

## 2018-08-09 NOTE — Telephone Encounter (Signed)
Pt had been sending me emails asking for a status.  After a long conversation with the physician assistant Audelia Acton.  We can not as pcp take the patient out for mental illness.  He has been given a note through this Saturday 6/27.  Also explained to pt he cannot have to pcp doing the same thing, he will need to choose.  Also explained when he was out for the previous time in March and April that would have been perfect time for him to seek counseling for his anxiety.  I did advise if his anxiety got to the point he did not know what to do to go the [place].  He said ok.

## 2018-08-23 ENCOUNTER — Other Ambulatory Visit: Payer: Self-pay | Admitting: Medical

## 2018-10-20 ENCOUNTER — Other Ambulatory Visit: Payer: Self-pay | Admitting: Medical

## 2018-12-16 ENCOUNTER — Other Ambulatory Visit: Payer: Self-pay | Admitting: Medical

## 2018-12-20 ENCOUNTER — Other Ambulatory Visit: Payer: Self-pay | Admitting: Medical

## 2018-12-20 NOTE — Telephone Encounter (Signed)
Is this okay to refill? 

## 2019-01-17 ENCOUNTER — Other Ambulatory Visit: Payer: Self-pay | Admitting: Medical

## 2019-02-15 ENCOUNTER — Other Ambulatory Visit: Payer: Self-pay | Admitting: Medical

## 2019-02-17 ENCOUNTER — Other Ambulatory Visit: Payer: Self-pay | Admitting: Medical

## 2019-03-25 ENCOUNTER — Encounter: Payer: Commercial Managed Care - PPO | Admitting: Medical

## 2019-04-16 ENCOUNTER — Encounter: Payer: Commercial Managed Care - PPO | Admitting: Medical

## 2021-09-04 ENCOUNTER — Emergency Department (HOSPITAL_BASED_OUTPATIENT_CLINIC_OR_DEPARTMENT_OTHER): Payer: Commercial Managed Care - PPO | Admitting: Radiology

## 2021-09-04 ENCOUNTER — Encounter (HOSPITAL_BASED_OUTPATIENT_CLINIC_OR_DEPARTMENT_OTHER): Payer: Self-pay | Admitting: Emergency Medicine

## 2021-09-04 ENCOUNTER — Inpatient Hospital Stay (HOSPITAL_BASED_OUTPATIENT_CLINIC_OR_DEPARTMENT_OTHER)
Admission: EM | Admit: 2021-09-04 | Discharge: 2021-09-12 | DRG: 372 | Disposition: A | Discharge status: Home / Self Care | Payer: Commercial Managed Care - PPO | Attending: Interventional Radiology | Admitting: Interventional Radiology

## 2021-09-04 ENCOUNTER — Other Ambulatory Visit: Payer: Self-pay

## 2021-09-04 ENCOUNTER — Emergency Department (HOSPITAL_BASED_OUTPATIENT_CLINIC_OR_DEPARTMENT_OTHER): Payer: Commercial Managed Care - PPO

## 2021-09-04 DIAGNOSIS — B961 Klebsiella pneumoniae [K. pneumoniae] as the cause of diseases classified elsewhere: Secondary | ICD-10-CM | POA: Diagnosis present

## 2021-09-04 DIAGNOSIS — E871 Hypo-osmolality and hyponatremia: Secondary | ICD-10-CM | POA: Diagnosis not present

## 2021-09-04 DIAGNOSIS — G4733 Obstructive sleep apnea (adult) (pediatric): Secondary | ICD-10-CM | POA: Diagnosis present

## 2021-09-04 DIAGNOSIS — F419 Anxiety disorder, unspecified: Secondary | ICD-10-CM | POA: Diagnosis present

## 2021-09-04 DIAGNOSIS — K3532 Acute appendicitis with perforation and localized peritonitis, without abscess: Secondary | ICD-10-CM | POA: Diagnosis present

## 2021-09-04 DIAGNOSIS — Z6836 Body mass index (BMI) 36.0-36.9, adult: Secondary | ICD-10-CM | POA: Diagnosis not present

## 2021-09-04 DIAGNOSIS — I10 Essential (primary) hypertension: Secondary | ICD-10-CM | POA: Diagnosis present

## 2021-09-04 DIAGNOSIS — N179 Acute kidney failure, unspecified: Secondary | ICD-10-CM | POA: Diagnosis not present

## 2021-09-04 DIAGNOSIS — Z79899 Other long term (current) drug therapy: Secondary | ICD-10-CM | POA: Diagnosis not present

## 2021-09-04 DIAGNOSIS — Z87891 Personal history of nicotine dependence: Secondary | ICD-10-CM | POA: Diagnosis not present

## 2021-09-04 DIAGNOSIS — Z841 Family history of disorders of kidney and ureter: Secondary | ICD-10-CM | POA: Diagnosis not present

## 2021-09-04 DIAGNOSIS — R1031 Right lower quadrant pain: Secondary | ICD-10-CM | POA: Diagnosis present

## 2021-09-04 DIAGNOSIS — E669 Obesity, unspecified: Secondary | ICD-10-CM | POA: Diagnosis present

## 2021-09-04 DIAGNOSIS — Z8042 Family history of malignant neoplasm of prostate: Secondary | ICD-10-CM | POA: Diagnosis not present

## 2021-09-04 DIAGNOSIS — K567 Ileus, unspecified: Secondary | ICD-10-CM | POA: Diagnosis present

## 2021-09-04 DIAGNOSIS — Z8249 Family history of ischemic heart disease and other diseases of the circulatory system: Secondary | ICD-10-CM

## 2021-09-04 DIAGNOSIS — K3533 Acute appendicitis with perforation and localized peritonitis, with abscess: Secondary | ICD-10-CM | POA: Diagnosis present

## 2021-09-04 DIAGNOSIS — Z833 Family history of diabetes mellitus: Secondary | ICD-10-CM

## 2021-09-04 DIAGNOSIS — E876 Hypokalemia: Secondary | ICD-10-CM | POA: Diagnosis present

## 2021-09-04 LAB — COMPREHENSIVE METABOLIC PANEL
ALT: 24 U/L (ref 0–44)
AST: 22 U/L (ref 15–41)
Albumin: 4.2 g/dL (ref 3.5–5.0)
Alkaline Phosphatase: 64 U/L (ref 38–126)
Anion gap: 12 (ref 5–15)
BUN: 10 mg/dL (ref 6–20)
CO2: 29 mmol/L (ref 22–32)
Calcium: 9.6 mg/dL (ref 8.9–10.3)
Chloride: 96 mmol/L — ABNORMAL LOW (ref 98–111)
Creatinine, Ser: 1.01 mg/dL (ref 0.61–1.24)
GFR, Estimated: >60 mL/min (ref >60)
Glucose, Bld: 129 mg/dL — ABNORMAL HIGH (ref 70–99)
Potassium: 3 mmol/L — ABNORMAL LOW (ref 3.5–5.1)
Sodium: 137 mmol/L (ref 135–145)
Total Bilirubin: 0.6 mg/dL (ref 0.3–1.2)
Total Protein: 8.4 g/dL — ABNORMAL HIGH (ref 6.5–8.1)

## 2021-09-04 LAB — CBC WITH DIFFERENTIAL/PLATELET
Abs Immature Granulocytes: 0.06 10*3/uL (ref 0.00–0.07)
Basophils Absolute: 0.1 10*3/uL (ref 0.0–0.1)
Basophils Relative: 0 %
Eosinophils Absolute: 0.1 10*3/uL (ref 0.0–0.5)
Eosinophils Relative: 1 %
HCT: 42 % (ref 39.0–52.0)
Hemoglobin: 14.8 g/dL (ref 13.0–17.0)
Immature Granulocytes: 0 %
Lymphocytes Relative: 8 %
Lymphs Abs: 1.2 10*3/uL (ref 0.7–4.0)
MCH: 31.2 pg (ref 26.0–34.0)
MCHC: 35.2 g/dL (ref 30.0–36.0)
MCV: 88.4 fL (ref 80.0–100.0)
Monocytes Absolute: 1.8 10*3/uL — ABNORMAL HIGH (ref 0.1–1.0)
Monocytes Relative: 13 %
Neutro Abs: 11.4 10*3/uL — ABNORMAL HIGH (ref 1.7–7.7)
Neutrophils Relative %: 78 %
Platelets: 331 10*3/uL (ref 150–400)
RBC: 4.75 MIL/uL (ref 4.22–5.81)
RDW: 12.1 % (ref 11.5–15.5)
WBC: 14.6 10*3/uL — ABNORMAL HIGH (ref 4.0–10.5)
nRBC: 0 % (ref 0.0–0.2)

## 2021-09-04 LAB — URINALYSIS, ROUTINE W REFLEX MICROSCOPIC
Bilirubin Urine: NEGATIVE
Glucose, UA: NEGATIVE mg/dL
Ketones, ur: NEGATIVE mg/dL
Leukocytes,Ua: NEGATIVE
Nitrite: NEGATIVE
Specific Gravity, Urine: 1.028 (ref 1.005–1.030)
pH: 7 (ref 5.0–8.0)

## 2021-09-04 LAB — LACTIC ACID, PLASMA: Lactic Acid, Venous: 1 mmol/L (ref 0.5–1.9)

## 2021-09-04 MED ORDER — ONDANSETRON HCL 4 MG/2ML IJ SOLN: 4.0000 mg | Freq: Four times a day (QID) | INTRAMUSCULAR | Status: DC | PRN

## 2021-09-04 MED ORDER — METHOCARBAMOL 1000 MG/10ML IJ SOLN: 500.0000 mg | Freq: Three times a day (TID) | INTRAVENOUS | Status: DC | PRN

## 2021-09-04 MED ORDER — MELATONIN 3 MG PO TABS
3.0000 mg | ORAL_TABLET | Freq: Every evening | ORAL | Status: DC | PRN
  Administered 2021-09-05: 3 mg via ORAL
  Filled 2021-09-04 (×2): qty 1

## 2021-09-04 MED ORDER — ACETAMINOPHEN 650 MG RE SUPP: 650.0000 mg | Freq: Four times a day (QID) | RECTAL | Status: DC | PRN

## 2021-09-04 MED ORDER — PROCHLORPERAZINE MALEATE 10 MG PO TABS: 10.0000 mg | ORAL_TABLET | Freq: Four times a day (QID) | ORAL | Status: DC | PRN

## 2021-09-04 MED ORDER — PIPERACILLIN-TAZOBACTAM 3.375 G IVPB
3.3750 g | Freq: Three times a day (TID) | INTRAVENOUS | Status: AC
  Administered 2021-09-05 – 2021-09-11 (×21): 3.375 g via INTRAVENOUS
  Filled 2021-09-04 (×22): qty 50

## 2021-09-04 MED ORDER — IOHEXOL 300 MG/ML  SOLN
100.0000 mL | Freq: Once | INTRAMUSCULAR | Status: AC | PRN
  Administered 2021-09-04: 100 mL via INTRAVENOUS

## 2021-09-04 MED ORDER — PROCHLORPERAZINE EDISYLATE 10 MG/2ML IJ SOLN: 5.0000 mg | Freq: Four times a day (QID) | INTRAMUSCULAR | Status: DC | PRN

## 2021-09-04 MED ORDER — DIPHENHYDRAMINE HCL 50 MG/ML IJ SOLN: 12.5000 mg | Freq: Four times a day (QID) | INTRAMUSCULAR | Status: DC | PRN

## 2021-09-04 MED ORDER — ACETAMINOPHEN 500 MG PO TABS
1000.0000 mg | ORAL_TABLET | Freq: Four times a day (QID) | ORAL | Status: DC | PRN
  Administered 2021-09-05 – 2021-09-09 (×6): 1000 mg via ORAL
  Filled 2021-09-04 (×7): qty 2

## 2021-09-04 MED ORDER — ONDANSETRON 4 MG PO TBDP: 4.0000 mg | ORAL_TABLET | Freq: Four times a day (QID) | ORAL | Status: DC | PRN

## 2021-09-04 MED ORDER — AMLODIPINE BESYLATE 10 MG PO TABS
10.0000 mg | ORAL_TABLET | Freq: Every day | ORAL | Status: DC
  Administered 2021-09-05 – 2021-09-07 (×3): 10 mg via ORAL
  Filled 2021-09-04 (×4): qty 1

## 2021-09-04 MED ORDER — KCL IN DEXTROSE-NACL 20-5-0.45 MEQ/L-%-% IV SOLN
Freq: Once | INTRAVENOUS | Status: AC
  Filled 2021-09-04: qty 1000

## 2021-09-04 MED ORDER — VALACYCLOVIR HCL 500 MG PO TABS
500.0000 mg | ORAL_TABLET | Freq: Every day | ORAL | Status: DC
  Administered 2021-09-05 – 2021-09-12 (×7): 500 mg via ORAL
  Filled 2021-09-04 (×8): qty 1

## 2021-09-04 MED ORDER — LISINOPRIL 10 MG PO TABS
10.0000 mg | ORAL_TABLET | Freq: Every day | ORAL | Status: DC
  Administered 2021-09-05 – 2021-09-06 (×2): 10 mg via ORAL
  Filled 2021-09-04 (×2): qty 1

## 2021-09-04 MED ORDER — DIPHENHYDRAMINE HCL 12.5 MG/5ML PO ELIX: 12.5000 mg | ORAL_SOLUTION | Freq: Four times a day (QID) | ORAL | Status: DC | PRN

## 2021-09-04 MED ORDER — METHOCARBAMOL 500 MG PO TABS: 500.0000 mg | ORAL_TABLET | Freq: Three times a day (TID) | ORAL | Status: DC | PRN

## 2021-09-04 MED ORDER — ENOXAPARIN SODIUM 40 MG/0.4ML IJ SOSY
40.0000 mg | PREFILLED_SYRINGE | INTRAMUSCULAR | Status: DC
  Administered 2021-09-05 – 2021-09-07 (×3): 40 mg via SUBCUTANEOUS
  Filled 2021-09-04 (×3): qty 0.4

## 2021-09-04 MED ORDER — PIPERACILLIN-TAZOBACTAM 3.375 G IVPB 30 MIN
3.3750 g | Freq: Once | INTRAVENOUS | Status: AC
  Administered 2021-09-04: 3.375 g via INTRAVENOUS
  Filled 2021-09-04: qty 50

## 2021-09-04 MED ORDER — HYDROMORPHONE HCL 1 MG/ML IJ SOLN
0.5000 mg | INTRAMUSCULAR | Status: DC | PRN
  Administered 2021-09-05: 1 mg via INTRAVENOUS
  Administered 2021-09-06: 0.5 mg via INTRAVENOUS
  Filled 2021-09-04: qty 1
  Filled 2021-09-04: qty 0.5

## 2021-09-04 MED ORDER — ONDANSETRON HCL 4 MG/2ML IJ SOLN
4.0000 mg | Freq: Four times a day (QID) | INTRAMUSCULAR | Status: DC | PRN
Start: 2021-09-04 — End: 2021-09-06

## 2021-09-04 NOTE — ED Notes (Signed)
Report given to the Floor Rn

## 2021-09-04 NOTE — ED Provider Notes (Signed)
[facility] Provider Note   CSN: [id number] Arrival date & time: 09/04/21  1211     History  Chief Complaint  Patient presents with   Abdominal Pain    Ryan Herman is a 41 y.o. male.  Pt reports he has had pain since Tuesday.  Pt thought the medication he was taking was causing discomfort and he stopped it   The history is provided by the patient. No language interpreter was used.  Abdominal Pain Pain location:  RLQ and periumbilical Pain quality: aching and dull   Pain radiates to:  Does not radiate Pain severity:  Moderate Onset quality:  Gradual Duration:  5 days Timing:  Constant Progression:  Worsening Chronicity:  New Context: not diet changes and not eating   Relieved by:  Nothing Worsened by:  Nothing Ineffective treatments:  None tried Associated symptoms: no dysuria and no nausea   Risk factors: no alcohol abuse and no NSAID use        Home Medications Prior to Admission medications   Medication Sig Start Date End Date Taking? Authorizing Provider  amLODipine (NORVASC) 10 MG tablet TAKE 1 TABLET BY MOUTH EVERY DAY 01/17/19   Tysinger, [name], PA-C  quinapril (ACCUPRIL) 10 MG tablet Take 10 mg by mouth daily.    [provider]  valACYclovir (VALTREX) 500 MG tablet TAKE 1 TABLET BY MOUTH EVERY DAY 12/21/18   Tysinger, [name], PA-C      Allergies    Patient has no known allergies.    Review of Systems   Review of Systems  Gastrointestinal:  Positive for abdominal pain. Negative for nausea.  Genitourinary:  Negative for dysuria.  All other systems reviewed and are negative.   Physical Exam Updated Vital Signs BP 128/83 (BP Location: Right Arm)   Pulse 99   Temp 99.1 F (37.3 C) (Oral)   Resp 18   SpO2 98%  Physical Exam Vitals and nursing note reviewed.  Constitutional:      Appearance: He is well-developed.  HENT:     Head: Normocephalic.  Cardiovascular:     Rate and Rhythm: Normal rate and  regular rhythm.  Pulmonary:     Effort: Pulmonary effort is normal.  Abdominal:     General: Abdomen is flat. Bowel sounds are normal. There is no distension.     Palpations: Abdomen is soft.     Tenderness: There is abdominal tenderness in the right lower quadrant.  Musculoskeletal:        General: Normal range of motion.     Cervical back: Normal range of motion.  Skin:    General: Skin is warm.  Neurological:     General: No focal deficit present.     Mental Status: He is alert and oriented to person, place, and time.     ED Results / Procedures / Treatments   Labs (all labs ordered are listed, but only abnormal results are displayed) Labs Reviewed  COMPREHENSIVE METABOLIC PANEL - Abnormal; Notable for the following components:      Result Value   Potassium 3.0 (*)    Chloride 96 (*)    Glucose, Bld 129 (*)    Total Protein 8.4 (*)    All other components within normal limits  CBC WITH DIFFERENTIAL/PLATELET - Abnormal; Notable for the following components:   WBC 14.6 (*)    Neutro Abs 11.4 (*)    Monocytes Absolute 1.8 (*)    All other components within normal  limits  URINALYSIS, ROUTINE W REFLEX MICROSCOPIC - Abnormal; Notable for the following components:   Hgb urine dipstick MODERATE (*)    Protein, ur TRACE (*)    All other components within normal limits  CULTURE, BLOOD (ROUTINE X 2)  LACTIC ACID, PLASMA    EKG None  Radiology CT ABDOMEN PELVIS W CONTRAST  Result Date: 09/04/2021 CLINICAL DATA:  Abdominal pain for 1 week EXAM: CT ABDOMEN AND PELVIS WITH CONTRAST TECHNIQUE: Multidetector CT imaging of the abdomen and pelvis was performed using the standard protocol following bolus administration of intravenous contrast. RADIATION DOSE REDUCTION: This exam was performed according to the departmental dose-optimization program which includes automated exposure control, adjustment of the mA and/or kV according to patient size and/or use of iterative reconstruction  technique. CONTRAST:  OMNIPAQUE IOHEXOL 300 MG/ML  SOLN COMPARISON:  CT examination dated February 01, 2012 FINDINGS: Lower chest: No acute abnormality. Hepatobiliary: No focal liver abnormality is seen. No gallstones, gallbladder wall thickening, or biliary dilatation. Pancreas: Unremarkable. No pancreatic ductal dilatation or surrounding inflammatory changes. Spleen: Normal in size without focal abnormality. Adrenals/Urinary Tract: Adrenal glands are unremarkable. Left upper pole renal cortical cysts. Symmetric perfusion of bilateral kidneys. No evidence of nephrolithiasis or hydronephrosis. Bladder is unremarkable. Stomach/Bowel: Stomach is within normal limits. There are pericecal inflammatory changes with small amount of extraluminal air and adjacent fat stranding, most consistent with perforated appendix, likely a subacute process. Vascular/Lymphatic: No significant vascular findings are present. No enlarged abdominal or pelvic lymph nodes. Reproductive: Prostate is unremarkable. Other: No abdominal wall hernia or abnormality. No abdominopelvic ascites. 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 IMPRESSION: 1. Pericecal inflammatory changes with small focus of extraluminal free air most consistent with perforated appendix with surrounding inflammatory changes/developing abscess/fluid collection. Surgical consultation for further management is recommended. 2. Bowel loops are normal in caliber without evidence of obstruction. 3. No evidence of nephrolithiasis or hydronephrosis. Simple cyst in the upper pole of the left kidney, which does not require follow-up. Above findings and recommendations were reported to PA Sophia at approximately 5:05 p.m. on 09/04/2021. Electronically Signed   By: Larose Hires D.O.   On: 09/04/2021 17:08   DG Chest 2 View  Result Date: 09/04/2021 CLINICAL DATA:  Nausea, vomiting, abdominal pain EXAM: CHEST - 2 VIEW COMPARISON:  03/23/2009 FINDINGS:  Transverse diameter heart is in upper limits of normal. There are no signs of pulmonary edema or focal pulmonary consolidation. There is poor inspiration. There is no pleural effusion or pneumothorax. IMPRESSION: There are no signs of pulmonary edema or focal pulmonary consolidation. Electronically Signed   By: Ernie Avena M.D.   On: 09/04/2021 15:11    Procedures Procedures    Medications Ordered in ED Medications  dextrose 5 % and 0.45 % NaCl with KCl 20 mEq/L infusion (has no administration in time range)  ondansetron (ZOFRAN) injection 4 mg (has no administration in time range)  iohexol (OMNIPAQUE) 300 MG/ML solution 100 mL (100 mLs Intravenous Contrast Given 09/04/21 1635)  piperacillin-tazobactam (ZOSYN) IVPB 3.375 g (0 g Intravenous Stopped 09/04/21 1907)    ED Course/ Medical Decision Making/ A&P                           Medical Decision Making Patient complains of lower abdominal pain for the past 5 days  Amount and/or Complexity of Data Reviewed External Data Reviewed: notes.    Details: Primary care notes reviewed Labs: ordered. Decision-making  details documented in ED Course.    Details: Labs ordered reviewed and interpreted patient has an elevated white blood cell count of 14.6 urinalysis shows moderate hemoglobin Mistry's show a potassium of 3.0 Radiology: ordered and independent interpretation performed. Decision-making details documented in ED Course.    Details: Scan of abdomen and pelvis was ordered and reviewed.  CT scan shows pericecal inflammatory changes with small focus of extraluminal free air most consistent with perforated appendix with surrounding inflammatory changes Discussion of management or test interpretation with external provider(s): I spoke with Dr. Donell Beers general surgeon on call who will admit the patient to [facility].  Patient is given IV antibiotics he is given IV fluids.  Patient is advised that he may need to have an IR drain placed  as his appendix has perforated  Risk Prescription drug management. Decision regarding hospitalization.           Final Clinical Impression(s) / ED Diagnoses Final diagnoses:  Ruptured appendix    Rx / DC Orders ED Discharge Orders     None         Osie Cheeks 09/04/21 2040    Rolan Bucco, MD 09/04/21 2326

## 2021-09-04 NOTE — ED Triage Notes (Signed)
Pt was  taking claravis for his dermatitis for few months, stopped it to see if that was causing abominal pain.

## 2021-09-04 NOTE — ED Triage Notes (Signed)
Sharp abominal pain for over a week, soft stool. Denies nausea and vomiting.

## 2021-09-04 NOTE — H&P (Signed)
Ryan Herman is an 41 y.o. male.   Chief Complaint: Abdominal pain HPI:  Pt [facility] with 5 days of abdominal pain that was worsening.  He has still been able to eat, but says "food tastes a bit weird," and his appetite is down.  He denies n/v.  The pain came on gradually, but is constant.  It does wax a wane a little, but isn't going away.  He has never had pain like this before.  If he is very still and lies on his left side, it doesn't hurt very much.  Bending over makes it worse.  He had a few nights of chills, but no overt fevers at home.  He denies constipation.  He has had soft stools, but no diarrhea.  He denies dysuria. His main complaint right now is being hungry and thirsty.    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    Past Medical History:  Diagnosis Date   Anxiety    mild   Farsightedness    wears glasses   Genital herpes    diagnosed 2008   Microscopic hematuria 2013   [facility], Dr. [name], negative workup    Obesity    OSA (obstructive sleep apnea)    Pulmonary nodule seen on imaging study 2013   CT chest 2015, stable, benign, no further eval needed   Seborrheic dermatitis of scalp    [facility]    Past Surgical History:  Procedure Laterality Date   WISDOM TOOTH EXTRACTION      Family History  Problem Relation Age of Onset   Hypertension Mother    Diabetes Father        complications   Kidney disease Father        dialysis   Hypertension Brother    Cancer Maternal Uncle        prostate   Hypertension Brother    Stroke Neg Hx    Heart disease Neg Hx    Social History:  reports that he quit smoking about 13 years ago. His smoking use included cigarettes. He has never used smokeless tobacco. He reports current alcohol use of about 1.0 standard drink of alcohol per week. He reports that he does not use drugs.  Allergies: No Known Allergies  Medications Prior to Admission  Medication Sig Dispense  Refill   amLODipine (NORVASC) 10 MG tablet TAKE 1 TABLET BY MOUTH EVERY DAY 30 tablet 1   quinapril (ACCUPRIL) 10 MG tablet Take 10 mg by mouth daily.     valACYclovir (VALTREX) 500 MG tablet TAKE 1 TABLET BY MOUTH EVERY DAY 30 tablet 1    Results for orders placed or performed during the hospital encounter of 09/04/21 (from the past 48 hour(s))  Comprehensive metabolic panel     Status: Abnormal   Collection Time: 09/04/21 12:42 PM  Result Value Ref Range   Sodium 137 135 - 145 mmol/L   Potassium 3.0 (L) 3.5 - 5.1 mmol/L   Chloride 96 (L) 98 - 111 mmol/L   CO2 29 22 - 32 mmol/L   Glucose, Bld 129 (H) 70 - 99 mg/dL    Comment: Glucose reference range applies only to samples taken after fasting for at least 8 hours.   BUN 10 6 - 20 mg/dL   Creatinine, Ser 9.92 0.61 - 1.24 mg/dL   Calcium 9.6 8.9 - 42.6 mg/dL   Total Protein 8.4 (H) 6.5 - 8.1 g/dL   Albumin 4.2  3.5 - 5.0 g/dL   AST 22 15 - 41 U/L   ALT 24 0 - 44 U/L   Alkaline Phosphatase 64 38 - 126 U/L   Total Bilirubin 0.6 0.3 - 1.2 mg/dL   GFR, Estimated >03 >54 mL/min    Comment: (NOTE) Calculated using the CKD-EPI Creatinine Equation (2021)    Anion gap 12 5 - 15    Comment: Performed at [facility], [address], [place], [place] [address]  Lactic acid, plasma     Status: None   Collection Time: 09/04/21 12:42 PM  Result Value Ref Range   Lactic Acid, Venous 1.0 0.5 - 1.9 mmol/L    Comment: Performed at [facility], [address] [place], [place] [address]  CBC with Differential     Status: Abnormal   Collection Time: 09/04/21 12:42 PM  Result Value Ref Range   WBC 14.6 (H) 4.0 - 10.5 K/uL   RBC 4.75 4.22 - 5.81 MIL/uL   Hemoglobin 14.8 13.0 - 17.0 g/dL   HCT 00.1 74.9 - 44.9 %   MCV 88.4 80.0 - 100.0 fL   MCH 31.2 26.0 - 34.0 pg   MCHC 35.2 30.0 - 36.0 g/dL   RDW 67.5 91.6 - 38.4 %   Platelets 331 150 - 400 K/uL   nRBC 0.0 0.0 - 0.2 %   Neutrophils Relative % 78 %    Neutro Abs 11.4 (H) 1.7 - 7.7 K/uL   Lymphocytes Relative 8 %   Lymphs Abs 1.2 0.7 - 4.0 K/uL   Monocytes Relative 13 %   Monocytes Absolute 1.8 (H) 0.1 - 1.0 K/uL   Eosinophils Relative 1 %   Eosinophils Absolute 0.1 0.0 - 0.5 K/uL   Basophils Relative 0 %   Basophils Absolute 0.1 0.0 - 0.1 K/uL   Immature Granulocytes 0 %   Abs Immature Granulocytes 0.06 0.00 - 0.07 K/uL    Comment: Performed at [facility], [address], [place], [place] [address]  Urinalysis, Routine w reflex microscopic Urine, Clean Catch     Status: Abnormal   Collection Time: 09/04/21 12:42 PM  Result Value Ref Range   Color, Urine YELLOW YELLOW   APPearance CLEAR CLEAR   Specific Gravity, Urine 1.028 1.005 - 1.030   pH 7.0 5.0 - 8.0   Glucose, UA NEGATIVE NEGATIVE mg/dL   Hgb urine dipstick MODERATE (A) NEGATIVE   Bilirubin Urine NEGATIVE NEGATIVE   Ketones, ur NEGATIVE NEGATIVE mg/dL   Protein, ur TRACE (A) NEGATIVE mg/dL   Nitrite NEGATIVE NEGATIVE   Leukocytes,Ua NEGATIVE NEGATIVE   RBC / HPF 21-50 0 - 5 RBC/hpf   WBC, UA 0-5 0 - 5 WBC/hpf   Mucus PRESENT     Comment: Performed at [facility], [address], [place], [place] [address]   CT ABDOMEN PELVIS W CONTRAST  Result Date: 09/04/2021 CLINICAL DATA:  Abdominal pain for 1 week EXAM: CT ABDOMEN AND PELVIS WITH CONTRAST TECHNIQUE: Multidetector CT imaging of the abdomen and pelvis was performed using the standard protocol following bolus administration of intravenous contrast. RADIATION DOSE REDUCTION: This exam was performed according to the departmental dose-optimization program which includes automated exposure control, adjustment of the mA and/or kV according to patient size and/or use of iterative reconstruction technique. CONTRAST:  OMNIPAQUE IOHEXOL 300 MG/ML  SOLN COMPARISON:  CT examination dated February 01, 2012 FINDINGS: Lower chest: No acute abnormality. Hepatobiliary: No focal liver  abnormality is seen. No gallstones, gallbladder wall  thickening, or biliary dilatation. Pancreas: Unremarkable. No pancreatic ductal dilatation or surrounding inflammatory changes. Spleen: Normal in size without focal abnormality. Adrenals/Urinary Tract: Adrenal glands are unremarkable. Left upper pole renal cortical cysts. Symmetric perfusion of bilateral kidneys. No evidence of nephrolithiasis or hydronephrosis. Bladder is unremarkable. Stomach/Bowel: Stomach is within normal limits. There are pericecal inflammatory changes with small amount of extraluminal air and adjacent fat stranding, most consistent with perforated appendix, likely a subacute process. Vascular/Lymphatic: No significant vascular findings are present. No enlarged abdominal or pelvic lymph nodes. Reproductive: Prostate is unremarkable. Other: No abdominal wall hernia or abnormality. No abdominopelvic ascites. 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 IMPRESSION: 1. Pericecal inflammatory changes with small focus of extraluminal free air most consistent with perforated appendix with surrounding inflammatory changes/developing abscess/fluid collection. Surgical consultation for further management is recommended. 2. Bowel loops are normal in caliber without evidence of obstruction. 3. No evidence of nephrolithiasis or hydronephrosis. Simple cyst in the upper pole of the left kidney, which does not require follow-up. Above findings and recommendations were reported to PA Sophia at approximately 5:05 p.m. on 09/04/2021. Electronically Signed   By: Larose Hires D.O.   On: 09/04/2021 17:08   DG Chest 2 View  Result Date: 09/04/2021 CLINICAL DATA:  Nausea, vomiting, abdominal pain EXAM: CHEST - 2 VIEW COMPARISON:  03/23/2009 FINDINGS: Transverse diameter heart is in upper limits of normal. There are no signs of pulmonary edema or focal pulmonary consolidation. There is poor inspiration. There is no pleural effusion or  pneumothorax. IMPRESSION: There are no signs of pulmonary edema or focal pulmonary consolidation. Electronically Signed   By: Ernie Avena M.D.   On: 09/04/2021 15:11    Review of Systems  Gastrointestinal:  Positive for abdominal pain. Negative for constipation. Abdominal distention: very mild distention. All other systems reviewed and are negative.  Blood pressure 132/88, pulse (!) 105, temperature 100.1 F (37.8 C), temperature source Oral, resp. rate 19, SpO2 93 %.  Physical Exam Vitals reviewed.  Constitutional:      General: He is not in acute distress.    Appearance: He is well-developed. He is not ill-appearing, toxic-appearing or diaphoretic.  HENT:     Head: Normocephalic and atraumatic.  Eyes:     General: No scleral icterus.    Extraocular Movements: Extraocular movements intact.     Pupils: Pupils are equal, round, and reactive to light.  Cardiovascular:     Rate and Rhythm: Normal rate and regular rhythm.     Heart sounds: Normal heart sounds.  Pulmonary:     Effort: Pulmonary effort is normal. No respiratory distress.     Breath sounds: No stridor.  Chest:     Chest wall: No tenderness.  Abdominal:     General: Abdomen is protuberant. Bowel sounds are decreased.     Palpations: Abdomen is soft. There is no fluid wave, hepatomegaly or splenomegaly.     Tenderness: There is abdominal tenderness (mild) in the right lower quadrant. There is no guarding or rebound. Negative signs include Rovsing's sign.     Hernia: No hernia is present.  Skin:    General: Skin is warm and dry.     Capillary Refill: Capillary refill takes 2 to 3 seconds.     Coloration: Skin is not mottled or pale.     Findings: No erythema or rash.  Neurological:     General: No focal deficit present.     Mental Status: He is alert and oriented to  person, place, and time.  Psychiatric:        Mood and Affect: Mood normal. Mood is not anxious or depressed.        Behavior: Behavior normal.        Assessment/Plan Acute perforated appendicitis with phlegmon/abscess  IV antibiotics Ok to eat until 2 am. Npo at that point.   Anticipate ileus Will see if IR thinks fluid is amenable to abscess.  Otherwise, continue antibiotics and repeat scan in 2-3 days.  Pain control Pulmonary toilet.      Almond Lint, MD 09/04/2021, 9:06 PM

## 2021-09-04 NOTE — ED Notes (Signed)
Report given to [facility]. 

## 2021-09-05 ENCOUNTER — Encounter (HOSPITAL_COMMUNITY): Payer: Self-pay

## 2021-09-05 LAB — BASIC METABOLIC PANEL
Anion gap: 11 (ref 5–15)
Anion gap: 11 (ref 5–15)
BUN: 8 mg/dL (ref 6–20)
BUN: 7 mg/dL (ref 6–20)
CO2: 27 mmol/L (ref 22–32)
CO2: 24 mmol/L (ref 22–32)
Calcium: 8.5 mg/dL — ABNORMAL LOW (ref 8.9–10.3)
Calcium: 8 mg/dL — ABNORMAL LOW (ref 8.9–10.3)
Chloride: 96 mmol/L — ABNORMAL LOW (ref 98–111)
Chloride: 98 mmol/L (ref 98–111)
Creatinine, Ser: 1.14 mg/dL (ref 0.61–1.24)
Creatinine, Ser: 1.07 mg/dL (ref 0.61–1.24)
GFR, Estimated: >60 mL/min (ref >60)
GFR, Estimated: >60 mL/min (ref >60)
Glucose, Bld: 143 mg/dL — ABNORMAL HIGH (ref 70–99)
Glucose, Bld: 131 mg/dL — ABNORMAL HIGH (ref 70–99)
Potassium: 2.5 mmol/L — CL (ref 3.5–5.1)
Potassium: 3.7 mmol/L (ref 3.5–5.1)
Sodium: 134 mmol/L — ABNORMAL LOW (ref 135–145)
Sodium: 133 mmol/L — ABNORMAL LOW (ref 135–145)

## 2021-09-05 LAB — HIV ANTIBODY (ROUTINE TESTING W REFLEX): HIV Screen 4th Generation wRfx: NONREACTIVE

## 2021-09-05 MED ORDER — POTASSIUM CHLORIDE 10 MEQ/100ML IV SOLN
10.0000 meq | INTRAVENOUS | Status: DC
  Administered 2021-09-05 (×4): 10 meq via INTRAVENOUS
  Filled 2021-09-05 (×2): qty 100

## 2021-09-05 MED ORDER — POTASSIUM CHLORIDE CRYS ER 20 MEQ PO TBCR
40.0000 meq | EXTENDED_RELEASE_TABLET | Freq: Once | ORAL | Status: AC
Start: 2021-09-05 — End: 2021-09-05
  Administered 2021-09-05: 40 meq via ORAL
  Filled 2021-09-05: qty 2

## 2021-09-05 MED ORDER — POTASSIUM CHLORIDE 10 MEQ/100ML IV SOLN
INTRAVENOUS | Status: AC
  Administered 2021-09-05: 10 meq
  Filled 2021-09-05: qty 200

## 2021-09-05 MED ORDER — ORAL CARE MOUTH RINSE: 15.0000 mL | OROMUCOSAL | Status: DC | PRN

## 2021-09-05 NOTE — Progress Notes (Signed)
Pt has drank 1000 ml of clear liquids without pain, nausea, or vomiting.

## 2021-09-05 NOTE — Congregational Nurse Program (Signed)
Subjective/Chief Complaint: Minimal pain.   Objective: Vital signs in last 24 hours: Temp:  [98.8 F (37.1 C)-102.5 F (39.2 C)] 98.8 F (37.1 C) (07/23 0530) Pulse Rate:  [82-105] 86 (07/23 0400) Resp:  [16-20] 20 (07/23 0400) BP: (105-140)/(73-99) 105/73 (07/23 0400) SpO2:  [92 %-98 %] 94 % (07/23 0400) Weight:  [124.8 kg] 124.8 kg (07/22 2034) Last BM Date : 09/03/21  Intake/Output from previous day: 07/22 0701 - 07/23 0700 In: 650 [P.O.:600; IV Piggyback:50] Out: 325 [Urine:325] Intake/Output this shift: No intake/output data recorded.  Alert, well-appearing Unlabored respirations Abdomen soft, minimally tender in the right lower quadrant, nondistended  Lab Results:  Recent Labs    09/04/21 1242  WBC 14.6*  HGB 14.8  HCT 42.0  PLT 331   BMET Recent Labs    09/04/21 1242 09/05/21 0326  NA 137 134*  K 3.0* 2.5*  CL 96* 96*  CO2 29 27  GLUCOSE 129* 143*  BUN 10 8  CREATININE 1.01 1.14  CALCIUM 9.6 8.5*   PT/INR No results for input(s): "LABPROT", "INR" in the last 72 hours. ABG No results for input(s): "PHART", "HCO3" in the last 72 hours.  Invalid input(s): "PCO2", "PO2"  Studies/Results: CT ABDOMEN PELVIS W CONTRAST  Result Date: 09/04/2021 CLINICAL DATA:  Abdominal pain for 1 week EXAM: CT ABDOMEN AND PELVIS WITH CONTRAST TECHNIQUE: Multidetector CT imaging of the abdomen and pelvis was performed using the standard protocol following bolus administration of intravenous contrast. RADIATION DOSE REDUCTION: This exam was performed according to the departmental dose-optimization program which includes automated exposure control, adjustment of the mA and/or kV according to patient size and/or use of iterative reconstruction technique. CONTRAST:  OMNIPAQUE IOHEXOL 300 MG/ML  SOLN COMPARISON:  CT examination dated February 01, 2012 FINDINGS: Lower chest: No acute abnormality. Hepatobiliary: No focal liver abnormality is seen. No gallstones,  gallbladder wall thickening, or biliary dilatation. Pancreas: Unremarkable. No pancreatic ductal dilatation or surrounding inflammatory changes. Spleen: Normal in size without focal abnormality. Adrenals/Urinary Tract: Adrenal glands are unremarkable. Left upper pole renal cortical cysts. Symmetric perfusion of bilateral kidneys. No evidence of nephrolithiasis or hydronephrosis. Bladder is unremarkable. Stomach/Bowel: Stomach is within normal limits. There are pericecal inflammatory changes with small amount of extraluminal air and adjacent fat stranding, most consistent with perforated appendix, likely a subacute process. Vascular/Lymphatic: No significant vascular findings are present. No enlarged abdominal or pelvic lymph nodes. Reproductive: Prostate is unremarkable. Other: No abdominal wall hernia or abnormality. No abdominopelvic ascites. 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 IMPRESSION: 1. Pericecal inflammatory changes with small focus of extraluminal free air most consistent with perforated appendix with surrounding inflammatory changes/developing abscess/fluid collection. Surgical consultation for further management is recommended. 2. Bowel loops are normal in caliber without evidence of obstruction. 3. No evidence of nephrolithiasis or hydronephrosis. Simple cyst in the upper pole of the left kidney, which does not require follow-up. Above findings and recommendations were reported to PA Sophia at approximately 5:05 p.m. on 09/04/2021. Electronically Signed   By: Larose Hires D.O.   On: 09/04/2021 17:08   DG Chest 2 View  Result Date: 09/04/2021 CLINICAL DATA:  Nausea, vomiting, abdominal pain EXAM: CHEST - 2 VIEW COMPARISON:  03/23/2009 FINDINGS: Transverse diameter heart is in upper limits of normal. There are no signs of pulmonary edema or focal pulmonary consolidation. There is poor inspiration. There is no pleural effusion or pneumothorax. IMPRESSION: There are no  signs of pulmonary edema or focal pulmonary consolidation. Electronically  Signed   By: Ernie Avena M.D.   On: 09/04/2021 15:11    Anti-infectives: Anti-infectives (From admission, onward)    Start     Dose/Rate Route Frequency Ordered Stop   09/05/21 1000  valACYclovir (VALTREX) tablet 500 mg        500 mg Oral Daily 09/04/21 2321     09/05/21 0015  piperacillin-tazobactam (ZOSYN) IVPB 3.375 g        3.375 g 12.5 mL/hr over 240 Minutes Intravenous Every 8 hours 09/04/21 2321 09/11/21 2159   09/04/21 1730  piperacillin-tazobactam (ZOSYN) IVPB 3.375 g        3.375 g 100 mL/hr over 30 Minutes Intravenous  Once 09/04/21 1719 09/04/21 1907       Assessment/Plan: Acute perforated appendicitis with phlegmon/abscess   IV antibiotics Bowel rest-okay to advance diet to clear liquids if no IR procedure planned Anticipate ileus Will see if IR thinks fluid is amenable to abscess.  Otherwise, continue antibiotics and repeat scan in 2-3 days.  Pain control Pulmonary toilet.    LOS: 1 day    Berna Bue 09/05/2021

## 2021-09-05 NOTE — Progress Notes (Signed)
  Request seen for evaluation for RLQ drain for ruptured appendix.  Images reviewed by Dr. Lowella Dandy.  Collection is small, nothing to drain at this time.  Recommend antibiotics and repeat CT scan in a few days if patient clinically does not improve or condition worsens.  Janaisa Birkland S Keshona Kartes PA-C 09/05/2021 10:48 AM

## 2021-09-06 ENCOUNTER — Encounter (HOSPITAL_COMMUNITY): Payer: Self-pay

## 2021-09-06 LAB — CBC
HCT: 40.5 % (ref 39.0–52.0)
Hemoglobin: 13.8 g/dL (ref 13.0–17.0)
MCH: 30.9 pg (ref 26.0–34.0)
MCHC: 34.1 g/dL (ref 30.0–36.0)
MCV: 90.6 fL (ref 80.0–100.0)
Platelets: 337 10*3/uL (ref 150–400)
RBC: 4.47 MIL/uL (ref 4.22–5.81)
RDW: 12.2 % (ref 11.5–15.5)
WBC: 16.7 10*3/uL — ABNORMAL HIGH (ref 4.0–10.5)
nRBC: 0 % (ref 0.0–0.2)

## 2021-09-06 LAB — BASIC METABOLIC PANEL
Anion gap: 10 (ref 5–15)
BUN: 7 mg/dL (ref 6–20)
CO2: 29 mmol/L (ref 22–32)
Calcium: 8.1 mg/dL — ABNORMAL LOW (ref 8.9–10.3)
Chloride: 96 mmol/L — ABNORMAL LOW (ref 98–111)
Creatinine, Ser: 1.35 mg/dL — ABNORMAL HIGH (ref 0.61–1.24)
GFR, Estimated: >60 mL/min (ref >60)
Glucose, Bld: 126 mg/dL — ABNORMAL HIGH (ref 70–99)
Potassium: 3.4 mmol/L — ABNORMAL LOW (ref 3.5–5.1)
Sodium: 135 mmol/L (ref 135–145)

## 2021-09-06 MED ORDER — HYDRALAZINE HCL 20 MG/ML IJ SOLN: 10.0000 mg | Freq: Four times a day (QID) | INTRAMUSCULAR | Status: DC | PRN

## 2021-09-06 MED ORDER — KETOROLAC TROMETHAMINE 15 MG/ML IJ SOLN
30.0000 mg | Freq: Four times a day (QID) | INTRAMUSCULAR | Status: DC
  Administered 2021-09-06: 30 mg via INTRAVENOUS
  Filled 2021-09-06: qty 2

## 2021-09-06 MED ORDER — KCL IN DEXTROSE-NACL 20-5-0.45 MEQ/L-%-% IV SOLN
Freq: Once | INTRAVENOUS | Status: AC
  Filled 2021-09-06: qty 1000

## 2021-09-06 MED ORDER — HYDRALAZINE HCL 20 MG/ML IJ SOLN: 10.0000 mg | Freq: Four times a day (QID) | INTRAMUSCULAR | Status: DC

## 2021-09-06 MED ORDER — OXYCODONE HCL 5 MG/5ML PO SOLN
5.0000 mg | ORAL | Status: DC | PRN
  Administered 2021-09-08 – 2021-09-11 (×2): 5 mg via ORAL
  Filled 2021-09-06 (×4): qty 5

## 2021-09-06 MED ORDER — KCL IN DEXTROSE-NACL 20-5-0.45 MEQ/L-%-% IV SOLN
INTRAVENOUS | Status: DC
  Filled 2021-09-06 (×8): qty 1000

## 2021-09-06 MED ORDER — POTASSIUM CHLORIDE CRYS ER 20 MEQ PO TBCR
40.0000 meq | EXTENDED_RELEASE_TABLET | Freq: Once | ORAL | Status: AC
  Administered 2021-09-06: 40 meq via ORAL
  Filled 2021-09-06: qty 2

## 2021-09-06 NOTE — Progress Notes (Signed)
Subjective: More RLQ pain overnight.  Movement causes pain in central pelvis and radiates up abdomen  Temp of 100.6.  tolerating CLD  ROS: See above, otherwise other systems negative  Objective: Vital signs in last 24 hours: Temp:  [97.9 F (36.6 C)-100.6 F (38.1 C)] 97.9 F (36.6 C) (07/24 0831) Pulse Rate:  [93-107] 93 (07/24 0831) Resp:  [18-20] 18 (07/24 0831) BP: (92-115)/(62-75) 98/66 (07/24 0831) SpO2:  [95 %-96 %] 95 % (07/24 0831) Last BM Date : 09/05/21  Intake/Output from previous day: 07/23 0701 - 07/24 0700 In: 1111.3 [P.O.:1000; IV Piggyback:111.3] Out: -  Intake/Output this shift: No intake/output data recorded.  PE: Heart: regular Lungs: CTAB Abd: soft, mild RLQ tenderness, +BS  Lab Results:  Recent Labs    09/04/21 1242 09/06/21 0217  WBC 14.6* 16.7*  HGB 14.8 13.8  HCT 42.0 40.5  PLT 331 337   BMET Recent Labs    09/05/21 1103 09/06/21 0217  NA 133* 135  K 3.7 3.4*  CL 98 96*  CO2 24 29  GLUCOSE 131* 126*  BUN 7 7  CREATININE 1.07 1.35*  CALCIUM 8.0* 8.1*   PT/INR No results for input(s): "LABPROT", "INR" in the last 72 hours. CMP     Component Value Date/Time   NA 135 09/06/2021 0217   NA 139 03/19/2018 1440   K 3.4 (L) 09/06/2021 0217   CL 96 (L) 09/06/2021 0217   CO2 29 09/06/2021 0217   GLUCOSE 126 (H) 09/06/2021 0217   BUN 7 09/06/2021 0217   BUN 13 03/19/2018 1440   CREATININE 1.35 (H) 09/06/2021 0217   CREATININE 1.01 02/17/2016 1017   CALCIUM 8.1 (L) 09/06/2021 0217   PROT 8.4 (H) 09/04/2021 1242   PROT 7.3 03/19/2018 1440   ALBUMIN 4.2 09/04/2021 1242   ALBUMIN 4.3 03/19/2018 1440   AST 22 09/04/2021 1242   ALT 24 09/04/2021 1242   ALKPHOS 64 09/04/2021 1242   BILITOT 0.6 09/04/2021 1242   BILITOT 0.4 03/19/2018 1440   GFRNONAA >60 09/06/2021 0217   GFRAA 97 03/19/2018 1440   Lipase  No results found for: "LIPASE"     Studies/Results: CT ABDOMEN PELVIS W CONTRAST  Result Date:  09/04/2021 CLINICAL DATA:  Abdominal pain for 1 week EXAM: CT ABDOMEN AND PELVIS WITH CONTRAST TECHNIQUE: Multidetector CT imaging of the abdomen and pelvis was performed using the standard protocol following bolus administration of intravenous contrast. RADIATION DOSE REDUCTION: This exam was performed according to the departmental dose-optimization program which includes automated exposure control, adjustment of the mA and/or kV according to patient size and/or use of iterative reconstruction technique. CONTRAST:  OMNIPAQUE IOHEXOL 300 MG/ML  SOLN COMPARISON:  CT examination dated February 01, 2012 FINDINGS: Lower chest: No acute abnormality. Hepatobiliary: No focal liver abnormality is seen. No gallstones, gallbladder wall thickening, or biliary dilatation. Pancreas: Unremarkable. No pancreatic ductal dilatation or surrounding inflammatory changes. Spleen: Normal in size without focal abnormality. Adrenals/Urinary Tract: Adrenal glands are unremarkable. Left upper pole renal cortical cysts. Symmetric perfusion of bilateral kidneys. No evidence of nephrolithiasis or hydronephrosis. Bladder is unremarkable. Stomach/Bowel: Stomach is within normal limits. There are pericecal inflammatory changes with small amount of extraluminal air and adjacent fat stranding, most consistent with perforated appendix, likely a subacute process. Vascular/Lymphatic: No significant vascular findings are present. No enlarged abdominal or pelvic lymph nodes. Reproductive: Prostate is unremarkable. Other: No abdominal wall hernia or abnormality. No abdominopelvic ascites. Musculoskeletal: Mild degenerate disc disease of  the lumbar spine. IMPRESSION: 1. Pericecal inflammatory changes with small focus of extraluminal free air most consistent with perforated appendix with surrounding inflammatory changes/developing abscess/fluid collection. Surgical consultation for further management is recommended. 2. Bowel loops are normal in  caliber without evidence of obstruction. 3. No evidence of nephrolithiasis or hydronephrosis. Simple cyst in the upper pole of the left kidney, which does not require follow-up. Above findings and recommendations were reported to PA Sophia at approximately 5:05 p.m. on 09/04/2021. Electronically Signed   By: Larose Hires D.O.   On: 09/04/2021 17:08   DG Chest 2 View  Result Date: 09/04/2021 CLINICAL DATA:  Nausea, vomiting, abdominal pain EXAM: CHEST - 2 VIEW COMPARISON:  03/23/2009 FINDINGS: Transverse diameter heart is in upper limits of normal. There are no signs of pulmonary edema or focal pulmonary consolidation. There is poor inspiration. There is no pleural effusion or pneumothorax. IMPRESSION: There are no signs of pulmonary edema or focal pulmonary consolidation. Electronically Signed   By: Ernie Avena M.D.   On: 09/04/2021 15:11    Anti-infectives: Anti-infectives (From admission, onward)    Start     Dose/Rate Route Frequency Ordered Stop   09/05/21 1000  valACYclovir (VALTREX) tablet 500 mg        500 mg Oral Daily 09/04/21 2321     09/05/21 0015  piperacillin-tazobactam (ZOSYN) IVPB 3.375 g        3.375 g 12.5 mL/hr over 240 Minutes Intravenous Every 8 hours 09/04/21 2321 09/11/21 2159   09/04/21 1730  piperacillin-tazobactam (ZOSYN) IVPB 3.375 g        3.375 g 100 mL/hr over 30 Minutes Intravenous  Once 09/04/21 1719 09/04/21 1907        Assessment/Plan Acute perforate appendicitis with phlegmon -more pain overnight.  WBC now up to 16K and low grade temp of 100.6 -monitor on CLD today but if all above persist, repeat CT tomorrow to see if anything drainable -cont zosyn   FEN - CLD/IVFs VTE - lovenox ID - zosyn  AKI - Cr up to 1.35 today, DC toradol.  Restart fluids, check BMET in am Hypokalemia - replace HTN - on lisinopril, but will hold in setting of elevated Cr  I reviewed nursing notes, last 24 h vitals and pain scores, last 48 h intake and output, last  24 h labs and trends, and last 24 h imaging results.   LOS: 2 days    Letha Cape , [facility] 09/06/2021, 11:17 AM Please see Amion for pager number during day hours 7:00am-4:30pm or 7:00am -11:30am on weekends

## 2021-09-06 NOTE — Progress Notes (Signed)
Patient refused CPAP use tonight.

## 2021-09-06 NOTE — Progress Notes (Signed)
[facility] Doctor on call paged thru answering service for something for pain.

## 2021-09-06 NOTE — Progress Notes (Signed)
  Transition of Care [facility]) Screening Note   Patient Details  Name: Ryan Herman Date of Birth: February 09, 1981   Transition of Care [facility]) CM/SW Contact:    Harriet Masson, RN Phone Number: 09/06/2021, 8:19 AM    Transition of Care Department [facility]) has reviewed patient and no TOC needs have been identified at this time. We will continue to monitor patient advancement through interdisciplinary progression rounds. If new patient transition needs arise, please place a TOC consult.

## 2021-09-07 ENCOUNTER — Inpatient Hospital Stay (HOSPITAL_COMMUNITY): Payer: Commercial Managed Care - PPO

## 2021-09-07 LAB — CBC
HCT: 36.8 % — ABNORMAL LOW (ref 39.0–52.0)
Hemoglobin: 13 g/dL (ref 13.0–17.0)
MCH: 31.5 pg (ref 26.0–34.0)
MCHC: 35.3 g/dL (ref 30.0–36.0)
MCV: 89.1 fL (ref 80.0–100.0)
Platelets: 342 10*3/uL (ref 150–400)
RBC: 4.13 MIL/uL — ABNORMAL LOW (ref 4.22–5.81)
RDW: 12.3 % (ref 11.5–15.5)
WBC: 15.5 10*3/uL — ABNORMAL HIGH (ref 4.0–10.5)
nRBC: 0 % (ref 0.0–0.2)

## 2021-09-07 LAB — BASIC METABOLIC PANEL
Anion gap: 10 (ref 5–15)
BUN: 8 mg/dL (ref 6–20)
CO2: 26 mmol/L (ref 22–32)
Calcium: 8.2 mg/dL — ABNORMAL LOW (ref 8.9–10.3)
Chloride: 99 mmol/L (ref 98–111)
Creatinine, Ser: 1.2 mg/dL (ref 0.61–1.24)
GFR, Estimated: >60 mL/min (ref >60)
Glucose, Bld: 145 mg/dL — ABNORMAL HIGH (ref 70–99)
Potassium: 3.5 mmol/L (ref 3.5–5.1)
Sodium: 135 mmol/L (ref 135–145)

## 2021-09-07 MED ORDER — IOHEXOL 9 MG/ML PO SOLN
ORAL | Status: AC
  Filled 2021-09-07: qty 1000

## 2021-09-07 MED ORDER — ENOXAPARIN SODIUM 40 MG/0.4ML IJ SOSY
40.0000 mg | PREFILLED_SYRINGE | INTRAMUSCULAR | Status: DC
  Administered 2021-09-09 – 2021-09-12 (×4): 40 mg via SUBCUTANEOUS
  Filled 2021-09-07 (×4): qty 0.4

## 2021-09-07 MED ORDER — POTASSIUM CHLORIDE CRYS ER 20 MEQ PO TBCR
40.0000 meq | EXTENDED_RELEASE_TABLET | Freq: Two times a day (BID) | ORAL | Status: AC
Start: 2021-09-07 — End: 2021-09-07
  Administered 2021-09-07 (×2): 40 meq via ORAL
  Filled 2021-09-07 (×2): qty 2

## 2021-09-07 MED ORDER — IOHEXOL 300 MG/ML  SOLN
100.0000 mL | Freq: Once | INTRAMUSCULAR | Status: AC | PRN
  Administered 2021-09-07: 100 mL via INTRAVENOUS

## 2021-09-07 NOTE — Progress Notes (Signed)
Subjective: Pain stable. AF overnight, but WBC still up around 15K.  Loose stool overnight.  ROS: See above, otherwise other systems negative  Objective: Vital signs in last 24 hours: Temp:  [98 F (36.7 C)-98.8 F (37.1 C)] 98.8 F (37.1 C) (07/25 0826) Pulse Rate:  [100] 100 (07/25 0826) Resp:  [16] 16 (07/25 0826) BP: (96-103)/(60) 103/60 (07/25 0826) SpO2:  [100 %] 100 % (07/25 0826) Last BM Date : 09/05/21  Intake/Output from previous day: 07/24 0701 - 07/25 0700 In: 481.3 [I.V.:481.3] Out: -  Intake/Output this shift: Total I/O In: 2356.3 [I.V.:2356.3] Out: -   PE: Abd: soft, mild RLQ tenderness, +BS  Lab Results:  Recent Labs    09/06/21 0217 09/07/21 0300  WBC 16.7* 15.5*  HGB 13.8 13.0  HCT 40.5 36.8*  PLT 337 342   BMET Recent Labs    09/06/21 0217 09/07/21 0300  NA 135 135  K 3.4* 3.5  CL 96* 99  CO2 29 26  GLUCOSE 126* 145*  BUN 7 8  CREATININE 1.35* 1.20  CALCIUM 8.1* 8.2*   PT/INR No results for input(s): "LABPROT", "INR" in the last 72 hours. CMP     Component Value Date/Time   NA 135 09/07/2021 0300   NA 139 03/19/2018 1440   K 3.5 09/07/2021 0300   CL 99 09/07/2021 0300   CO2 26 09/07/2021 0300   GLUCOSE 145 (H) 09/07/2021 0300   BUN 8 09/07/2021 0300   BUN 13 03/19/2018 1440   CREATININE 1.20 09/07/2021 0300   CREATININE 1.01 02/17/2016 1017   CALCIUM 8.2 (L) 09/07/2021 0300   PROT 8.4 (H) 09/04/2021 1242   PROT 7.3 03/19/2018 1440   ALBUMIN 4.2 09/04/2021 1242   ALBUMIN 4.3 03/19/2018 1440   AST 22 09/04/2021 1242   ALT 24 09/04/2021 1242   ALKPHOS 64 09/04/2021 1242   BILITOT 0.6 09/04/2021 1242   BILITOT 0.4 03/19/2018 1440   GFRNONAA >60 09/07/2021 0300   GFRAA 97 03/19/2018 1440   Lipase  No results found for: "LIPASE"     Studies/Results: No results found.  Anti-infectives: Anti-infectives (From admission, onward)    Start     Dose/Rate Route Frequency Ordered Stop   09/05/21 1000   valACYclovir (VALTREX) tablet 500 mg        500 mg Oral Daily 09/04/21 2321     09/05/21 0015  piperacillin-tazobactam (ZOSYN) IVPB 3.375 g        3.375 g 12.5 mL/hr over 240 Minutes Intravenous Every 8 hours 09/04/21 2321 09/11/21 2159   09/04/21 1730  piperacillin-tazobactam (ZOSYN) IVPB 3.375 g        3.375 g 100 mL/hr over 30 Minutes Intravenous  Once 09/04/21 1719 09/04/21 1907        Assessment/Plan Acute perforated appendicitis with phlegmon -pain stable, WBC still 15K.  Will plan to get repeat imaging today to eval phlegmon to see if this is now drainable, or better. -CLD -cont zosyn   FEN - CLD/IVFs VTE - lovenox ID - zosyn  AKI - Cr down to 1.2, DC toradol.  fluids, check BMET in am and monitor Hypokalemia - given more today HTN - on lisinopril, but will hold in setting of elevated Cr  I reviewed nursing notes, last 24 h vitals and pain scores, last 48 h intake and output, last 24 h labs and trends, and last 24 h imaging results.   LOS: 3 days    Letha Cape ,  PA-C [facility] 09/07/2021, 12:12 PM Please see Amion for pager number during day hours 7:00am-4:30pm or 7:00am -11:30am on weekends

## 2021-09-07 NOTE — Progress Notes (Signed)
IR procedure request - intra abdominal abscess drain placement.   Presented to the ED at [facility] on 7.22.23 with adbdominal pain X 5 days. Transferred to [facility] for further evaluation and treatment. New CT abd from 5.25.23 shows perforated appendix with gas/fluid collection. WBC 15.5. Blood cultures no growth X 3 days. Patient is  afebrile.  Patient is on lovenox prophylatic dosage.   Case has been reviewed and procedure approved by Dr. Loreta Ave.  Patient tentatively scheduled for 7.26.23.  Team instructed to: Keep Patient to be NPO after midnight Hold prophylactic anticoagulation 24 hours prior to scheduled procedure.Message sent to pharmacy.  IR will call patient when ready.

## 2021-09-07 NOTE — Progress Notes (Signed)
Patient refused the use of CPAP again tonight. Vital signs stable through out.

## 2021-09-08 ENCOUNTER — Encounter (HOSPITAL_COMMUNITY): Payer: Self-pay

## 2021-09-08 ENCOUNTER — Inpatient Hospital Stay (HOSPITAL_COMMUNITY): Payer: Commercial Managed Care - PPO

## 2021-09-08 LAB — BASIC METABOLIC PANEL
Anion gap: 9 (ref 5–15)
BUN: 6 mg/dL (ref 6–20)
CO2: 22 mmol/L (ref 22–32)
Calcium: 7.9 mg/dL — ABNORMAL LOW (ref 8.9–10.3)
Chloride: 99 mmol/L (ref 98–111)
Creatinine, Ser: 1.13 mg/dL (ref 0.61–1.24)
GFR, Estimated: >60 mL/min (ref >60)
Glucose, Bld: 157 mg/dL — ABNORMAL HIGH (ref 70–99)
Potassium: 3.6 mmol/L (ref 3.5–5.1)
Sodium: 130 mmol/L — ABNORMAL LOW (ref 135–145)

## 2021-09-08 LAB — PROTIME-INR
INR: 1.4 — ABNORMAL HIGH (ref 0.8–1.2)
Prothrombin Time: 16.6 seconds — ABNORMAL HIGH (ref 11.4–15.2)

## 2021-09-08 LAB — CBC
HCT: 36.5 % — ABNORMAL LOW (ref 39.0–52.0)
Hemoglobin: 12.6 g/dL — ABNORMAL LOW (ref 13.0–17.0)
MCH: 31.3 pg (ref 26.0–34.0)
MCHC: 34.5 g/dL (ref 30.0–36.0)
MCV: 90.6 fL (ref 80.0–100.0)
Platelets: 352 10*3/uL (ref 150–400)
RBC: 4.03 MIL/uL — ABNORMAL LOW (ref 4.22–5.81)
RDW: 12.5 % (ref 11.5–15.5)
WBC: 21.4 10*3/uL — ABNORMAL HIGH (ref 4.0–10.5)
nRBC: 0 % (ref 0.0–0.2)

## 2021-09-08 MED ORDER — FENTANYL CITRATE (PF) 100 MCG/2ML IJ SOLN
INTRAMUSCULAR | Status: AC
  Filled 2021-09-08: qty 4

## 2021-09-08 MED ORDER — MIDAZOLAM HCL 2 MG/2ML IJ SOLN
INTRAMUSCULAR | Status: AC
  Filled 2021-09-08: qty 2

## 2021-09-08 MED ORDER — LIDOCAINE HCL 1 % IJ SOLN
INTRAMUSCULAR | Status: AC
  Filled 2021-09-08: qty 10

## 2021-09-08 MED ORDER — FENTANYL CITRATE (PF) 100 MCG/2ML IJ SOLN
INTRAMUSCULAR | Status: AC
  Filled 2021-09-08: qty 2

## 2021-09-08 MED ORDER — MIDAZOLAM HCL 2 MG/2ML IJ SOLN
INTRAMUSCULAR | Status: AC
  Filled 2021-09-08: qty 4

## 2021-09-08 MED ORDER — MIDAZOLAM HCL 2 MG/2ML IJ SOLN
INTRAMUSCULAR | Status: AC | PRN
  Administered 2021-09-08 (×3): 1 mg via INTRAVENOUS

## 2021-09-08 MED ORDER — FENTANYL CITRATE (PF) 100 MCG/2ML IJ SOLN
INTRAMUSCULAR | Status: AC | PRN
  Administered 2021-09-08 (×2): 50 ug via INTRAVENOUS

## 2021-09-08 NOTE — Progress Notes (Signed)
Pt stated he could place self on cpap when ready for bed. 

## 2021-09-08 NOTE — Procedures (Signed)
Interventional Radiology Procedure Note  Date of Procedure: 09/08/2021  Procedure: CT drain placement   Findings:  1. CT drain placement into RLQ abscess, 12 Fr, placed to bulb. Sample sent to lab.    Complications: No immediate complications noted.   Estimated Blood Loss: minimal  Follow-up and Recommendations: 1. Keep to bulb suction  2. Per surgery    Olive Bass, MD  Vascular & Interventional Radiology  09/08/2021 12:19 PM

## 2021-09-08 NOTE — Progress Notes (Signed)
   09/08/21 1720  Assess: MEWS Score  Temp 100.3 F (37.9 C)  BP 98/65  MAP (mmHg) 76  Pulse Rate (!) 108  Resp 20  Assess: MEWS Score  MEWS Temp 0  MEWS Systolic 1  MEWS Pulse 1  MEWS RR 0  MEWS LOC 0  MEWS Score 2  MEWS Score Color Yellow  Assess: if the MEWS score is Yellow or Red  Were vital signs taken at a resting state? Yes  Focused Assessment No change from prior assessment  Does the patient meet 2 or more of the SIRS criteria? Yes  Does the patient have a confirmed or suspected source of infection? Yes  Provider and Rapid Response Notified? No  MEWS guidelines implemented *See Row Information* Yes  Treat  MEWS Interventions Administered scheduled meds/treatments  Escalate  MEWS: Escalate Yellow: discuss with charge nurse/RN and consider discussing with provider and RRT  Notify: Charge Nurse/RN  Name of Charge Nurse/RN Notified Jola Baptist RN  Date Charge Nurse/RN Notified 09/08/21  Time Charge Nurse/RN Notified 1726  Document  Patient Outcome Stabilized after interventions  Assess: SIRS CRITERIA  SIRS Temperature  0  SIRS Pulse 1  SIRS Respirations  0  SIRS WBC 1  SIRS Score Sum  2

## 2021-09-08 NOTE — Progress Notes (Signed)
Subjective: CC: Stable central abdominal pain. No n/v. Liquid BM this am. No PRN pain meds.  T max 100.8. Tachycardic in the low 100's. BP okay. WBC up at 21.4 from 15.5  Objective: Vital signs in last 24 hours: Temp:  [98.7 F (37.1 C)-100.8 F (38.2 C)] 98.7 F (37.1 C) (07/26 0830) Pulse Rate:  [100-103] 103 (07/26 0830) Resp:  [16-18] 18 (07/26 0830) BP: (102-114)/(60-64) 102/60 (07/26 0830) SpO2:  [99 %] 99 % (07/25 2014) Last BM Date : 09/05/21  Intake/Output from previous day: 07/25 0701 - 07/26 0700 In: 3039.4 [I.V.:2989.4; IV Piggyback:50] Out: -  Intake/Output this shift: No intake/output data recorded.  PE: Gen:  Alert, NAD, pleasant Card:  Tachycardic   Pulm:  CTAB, no W/R/R, effort normal Abd: Protuberant with question of some distension but soft, central abdominal ttp without rigidity or guarding. +BS Ext:  No LE edema Psych: A&Ox3    Lab Results:  Recent Labs    09/07/21 0300 09/08/21 0242  WBC 15.5* 21.4*  HGB 13.0 12.6*  HCT 36.8* 36.5*  PLT 342 352   BMET Recent Labs    09/07/21 0300 09/08/21 0242  NA 135 130*  K 3.5 3.6  CL 99 99  CO2 26 22  GLUCOSE 145* 157*  BUN 8 6  CREATININE 1.20 1.13  CALCIUM 8.2* 7.9*   PT/INR Recent Labs    09/08/21 0242  LABPROT 16.6*  INR 1.4*   CMP     Component Value Date/Time   NA 130 (L) 09/08/2021 0242   NA 139 03/19/2018 1440   K 3.6 09/08/2021 0242   CL 99 09/08/2021 0242   CO2 22 09/08/2021 0242   GLUCOSE 157 (H) 09/08/2021 0242   BUN 6 09/08/2021 0242   BUN 13 03/19/2018 1440   CREATININE 1.13 09/08/2021 0242   CREATININE 1.01 02/17/2016 1017   CALCIUM 7.9 (L) 09/08/2021 0242   PROT 8.4 (H) 09/04/2021 1242   PROT 7.3 03/19/2018 1440   ALBUMIN 4.2 09/04/2021 1242   ALBUMIN 4.3 03/19/2018 1440   AST 22 09/04/2021 1242   ALT 24 09/04/2021 1242   ALKPHOS 64 09/04/2021 1242   BILITOT 0.6 09/04/2021 1242   BILITOT 0.4 03/19/2018 1440   GFRNONAA >60 09/08/2021 0242   GFRAA  97 03/19/2018 1440   Lipase  No results found for: "LIPASE"  Studies/Results: CT ABDOMEN PELVIS W CONTRAST  Result Date: 09/07/2021 CLINICAL DATA:  Acute abdominal pain EXAM: CT ABDOMEN AND PELVIS WITH CONTRAST TECHNIQUE: Multidetector CT imaging of the abdomen and pelvis was performed using the standard protocol following bolus administration of intravenous contrast. RADIATION DOSE REDUCTION: This exam was performed according to the departmental dose-optimization program which includes automated exposure control, adjustment of the mA and/or kV according to patient size and/or use of iterative reconstruction technique. CONTRAST:  OMNIPAQUE IOHEXOL 300 MG/ML  SOLN COMPARISON:  CT 09/04/2021. FINDINGS: Lower chest: No acute abnormality. Hepatobiliary: No focal liver abnormality is seen. No gallstones, gallbladder wall thickening, or biliary dilatation. Pancreas: Unremarkable. No pancreatic ductal dilatation or surrounding inflammatory changes. Spleen: Normal in size without focal abnormality. Adrenals/Urinary Tract: Adrenal glands are unremarkable. Kidneys are normal, without renal calculi, focal lesion, or hydronephrosis. Simple renal cyst left kidney. Bladder is unremarkable. Stomach/Bowel: Increased pericecal inflammatory changes including increased size of the loculated fluid and gas collection in the right lower quadrant now measuring approximally 7.5 x 7.0 cm in the axial plane, previously 6.1 x 4.8 cm. Increased gaseous component  of this collection. Increased adjacent inflammatory stranding. Increased wall thickening and mucosal hyperenhancement of the adjacent sigmoid colon and cecum. Communication with the sigmoid colon is not excluded. Stomach and small bowel are within normal limits. Vascular/Lymphatic: No significant vascular findings are present. No enlarged abdominal or pelvic lymph nodes. Reproductive: Prostate is unremarkable. Other: Small bilateral fat containing inguinal hernias.  Musculoskeletal: Subcutaneous gas in the anterior abdominal likely related to injections. IMPRESSION: 1. Increased inflammatory changes in the pericecal region compatible with perforated appendicitis including increased size of the air and gas collection/abscess. 2. The inflammatory process abuts the sigmoid colon which also demonstrates marked inflammatory changes. Communication with the sigmoid colon is not excluded. Electronically Signed   By: Minerva Fester M.D.   On: 09/07/2021 14:10    Anti-infectives: Anti-infectives (From admission, onward)    Start     Dose/Rate Route Frequency Ordered Stop   09/05/21 1000  valACYclovir (VALTREX) tablet 500 mg        500 mg Oral Daily 09/04/21 2321     09/05/21 0015  piperacillin-tazobactam (ZOSYN) IVPB 3.375 g        3.375 g 12.5 mL/hr over 240 Minutes Intravenous Every 8 hours 09/04/21 2321 09/11/21 2159   09/04/21 1730  piperacillin-tazobactam (ZOSYN) IVPB 3.375 g        3.375 g 100 mL/hr over 30 Minutes Intravenous  Once 09/04/21 1719 09/04/21 1907        Assessment/Plan Acute perforated appendicitis with phlegmon - Repeat CT 7/25 w/ perforated appendicitis with increased size of intra-abdominal fluid collection. IR planning drainage today.  - Cont IV abx - Daily labs - Mobilize/Pulm toilet  FEN - NPO, IVF at 177ml/hr VTE - SCDs, Lovenox (on hold for IR procedure) ID - Zosyn 7/22 >>  T max 100.8. Tachycardic in the low 100's. BP okay. WBC up at 21.4 from 15.5. BCx NGTD. Also on Valtrex (home med)   Hyponatremia - 130. Cont IVF. Recheck in am.  AKI - resolved Hypokalemia - resolved HTN - Home amlodipine. PRN hydralazine. Lisinopril will held in setting of elevated Cr. BP okay this am. Okay to continue to hold today.   I reviewed nursing notes, last 24 h vitals and pain scores, last 48 h intake and output, last 24 h labs and trends, and last 24 h imaging results.   LOS: 4 days    Jacinto Halim , [facility]  Surgery 09/08/2021, 9:23 AM Please see Amion for pager number during day hours 7:00am-4:30pm

## 2021-09-08 NOTE — H&P (Signed)
Chief Complaint: Patient was seen in consultation today for perforated appendix   Referring Physician(s): Barnetta Chapel, PA-C  Supervising Physician: Pernell Dupre  Patient Status: [facility] - In-pt  History of Present Illness: Ryan Herman is a 41 y.o. male with PMH of obesity, obstructive sleep apnea, and anxiety being seen today for perforated appendix. Patient initially presented to [facility] ED on 7/22 with 5 days of abdominal pain. CT on 7/22 revealed perforated appendix with a developing abscess. IR was consulted at this time, but on review it was not felt that drain placement was appropriate at that time. Repeat CT on 09/07/21 revealed an increase in inflammatory changes associated with the abscess.   Past Medical History:  Diagnosis Date   Anxiety    mild   Farsightedness    wears glasses   Genital herpes    diagnosed 2008   Microscopic hematuria 2013   [facility], Dr. [name], negative workup    Obesity    OSA (obstructive sleep apnea)    Pulmonary nodule seen on imaging study 2013   CT chest 2015, stable, benign, no further eval needed   Seborrheic dermatitis of scalp    [facility]    Past Surgical History:  Procedure Laterality Date   WISDOM TOOTH EXTRACTION      Allergies: Patient has no known allergies.  Medications: Prior to Admission medications   Medication Sig Start Date End Date Taking? Authorizing Provider  amLODipine (NORVASC) 10 MG tablet TAKE 1 TABLET BY MOUTH EVERY DAY Patient taking differently: Take 10 mg by mouth daily. 01/17/19  Yes Tysinger, [name], PA-C  hydrochlorothiazide (HYDRODIURIL) 25 MG tablet Take 25 mg by mouth daily. 08/12/21  Yes [provider]  metoprolol succinate (TOPROL-XL) 25 MG 24 hr tablet Take 25 mg by mouth daily. 06/11/21  Yes [provider]  Multiple Vitamins-Minerals (MULTIVITAMIN WITH MINERALS) tablet Take 1 tablet by mouth daily.   Yes [provider]  valACYclovir  (VALTREX) 500 MG tablet TAKE 1 TABLET BY MOUTH EVERY DAY Patient taking differently: Take 500 mg by mouth daily. 12/21/18  Yes Tysinger, [name], PA-C  CLARAVIS 40 MG capsule Take 40 mg by mouth 2 (two) times daily. Patient not taking: Reported on 09/05/2021 05/31/21   [provider]  quinapril (ACCUPRIL) 10 MG tablet Take 10 mg by mouth daily. Patient not taking: Reported on 09/05/2021    [provider]     Family History  Problem Relation Age of Onset   Hypertension Mother    Diabetes Father        complications   Kidney disease Father        dialysis   Hypertension Brother    Cancer Maternal Uncle        prostate   Hypertension Brother    Stroke Neg Hx    Heart disease Neg Hx     Social History   Socioeconomic History   Marital status: Married    Spouse name: Not on file   Number of children: Not on file   Years of education: Not on file   Highest education level: Not on file  Occupational History   Occupation: drives bus for [employer]    Employer: [employer]  Tobacco Use   Smoking status: Former    Years: 10.00    Types: Cigarettes    Quit date: 07/31/2008    Years since quitting: 13.1   Smokeless tobacco: Never   Tobacco comments:    prior  black and milds, but not daily  Vaping Use   Vaping Use: Never used  Substance and Sexual Activity   Alcohol use: Yes    Alcohol/week: 1.0 standard drink of alcohol    Types: 1 Shots of liquor per week    Comment: used to drink heavy partying in younger years, but not daily. Occasional drink now   Drug use: No   Sexual activity: Not on file  Other Topics Concern   Not on file  Social History Narrative   Separated, has 1 daughter 79 yo, has a girlfriend.   Exercise - some with walking, some calisthenics.  Drives bus for [employer].   As of 02/2018   Social Determinants of Health   Financial Resource Strain: Not on file  Food Insecurity: Not on file  Transportation Needs: Not on file   Physical Activity: Not on file  Stress: Not on file  Social Connections: Not on file    Review of Systems: A 12 point ROS discussed and pertinent positives are indicated in the HPI above.  All other systems are negative.  Review of Systems  Constitutional:  Positive for fever. Negative for chills.  Respiratory:  Negative for chest tightness and shortness of breath.   Cardiovascular:  Negative for chest pain and leg swelling.  Gastrointestinal:  Positive for abdominal pain and diarrhea. Negative for nausea and vomiting.  Neurological:  Positive for dizziness and headaches.       Patient states he has felt mildly dizzy and has had a mild headache that he believes is from not eating since midnight and being on a liquid diet prior to this  Psychiatric/Behavioral:  Negative for confusion.     Vital Signs: BP 102/60 (BP Location: Right Arm)   Pulse (!) 103   Temp 98.7 F (37.1 C) (Oral)   Resp 18   Ht 6\' 1"  (1.854 m) Comment: per pt report  Wt 275 lb 2.2 oz (124.8 kg)   SpO2 99%   BMI 36.30 kg/m     Physical Exam Vitals reviewed.  Constitutional:      General: He is not in acute distress.    Appearance: He is obese. He is ill-appearing.  HENT:     Head: Normocephalic.     Mouth/Throat:     Mouth: Mucous membranes are moist.  Cardiovascular:     Rate and Rhythm: Regular rhythm. Tachycardia present.     Pulses: Normal pulses.     Heart sounds: Normal heart sounds.  Pulmonary:     Effort: Pulmonary effort is normal.     Breath sounds: Normal breath sounds.  Abdominal:     Palpations: Abdomen is soft.     Tenderness: There is abdominal tenderness in the right lower quadrant and periumbilical area.  Musculoskeletal:     Right lower leg: No edema.     Left lower leg: No edema.  Skin:    General: Skin is warm and dry.  Neurological:     Mental Status: He is alert and oriented to person, place, and time.  Psychiatric:        Mood and Affect: Mood normal.         Behavior: Behavior normal.        Thought Content: Thought content normal.        Judgment: Judgment normal.     Imaging: CT ABDOMEN PELVIS W CONTRAST  Result Date: 09/07/2021 CLINICAL DATA:  Acute abdominal pain EXAM: CT ABDOMEN AND PELVIS WITH CONTRAST TECHNIQUE:  Multidetector CT imaging of the abdomen and pelvis was performed using the standard protocol following bolus administration of intravenous contrast. RADIATION DOSE REDUCTION: This exam was performed according to the departmental dose-optimization program which includes automated exposure control, adjustment of the mA and/or kV according to patient size and/or use of iterative reconstruction technique. CONTRAST:  OMNIPAQUE IOHEXOL 300 MG/ML  SOLN COMPARISON:  CT 09/04/2021. FINDINGS: Lower chest: No acute abnormality. Hepatobiliary: No focal liver abnormality is seen. No gallstones, gallbladder wall thickening, or biliary dilatation. Pancreas: Unremarkable. No pancreatic ductal dilatation or surrounding inflammatory changes. Spleen: Normal in size without focal abnormality. Adrenals/Urinary Tract: Adrenal glands are unremarkable. Kidneys are normal, without renal calculi, focal lesion, or hydronephrosis. Simple renal cyst left kidney. Bladder is unremarkable. Stomach/Bowel: Increased pericecal inflammatory changes including increased size of the loculated fluid and gas collection in the right lower quadrant now measuring approximally 7.5 x 7.0 cm in the axial plane, previously 6.1 x 4.8 cm. Increased gaseous component of this collection. Increased adjacent inflammatory stranding. Increased wall thickening and mucosal hyperenhancement of the adjacent sigmoid colon and cecum. Communication with the sigmoid colon is not excluded. Stomach and small bowel are within normal limits. Vascular/Lymphatic: No significant vascular findings are present. No enlarged abdominal or pelvic lymph nodes. Reproductive: Prostate is unremarkable. Other: Small  bilateral fat containing inguinal hernias. Musculoskeletal: Subcutaneous gas in the anterior abdominal likely related to injections. IMPRESSION: 1. Increased inflammatory changes in the pericecal region compatible with perforated appendicitis including increased size of the air and gas collection/abscess. 2. The inflammatory process abuts the sigmoid colon which also demonstrates marked inflammatory changes. Communication with the sigmoid colon is not excluded. Electronically Signed   By: Minerva Fester M.D.   On: 09/07/2021 14:10   CT ABDOMEN PELVIS W CONTRAST  Result Date: 09/04/2021 CLINICAL DATA:  Abdominal pain for 1 week EXAM: CT ABDOMEN AND PELVIS WITH CONTRAST TECHNIQUE: Multidetector CT imaging of the abdomen and pelvis was performed using the standard protocol following bolus administration of intravenous contrast. RADIATION DOSE REDUCTION: This exam was performed according to the departmental dose-optimization program which includes automated exposure control, adjustment of the mA and/or kV according to patient size and/or use of iterative reconstruction technique. CONTRAST:  OMNIPAQUE IOHEXOL 300 MG/ML  SOLN COMPARISON:  CT examination dated February 01, 2012 FINDINGS: Lower chest: No acute abnormality. Hepatobiliary: No focal liver abnormality is seen. No gallstones, gallbladder wall thickening, or biliary dilatation. Pancreas: Unremarkable. No pancreatic ductal dilatation or surrounding inflammatory changes. Spleen: Normal in size without focal abnormality. Adrenals/Urinary Tract: Adrenal glands are unremarkable. Left upper pole renal cortical cysts. Symmetric perfusion of bilateral kidneys. No evidence of nephrolithiasis or hydronephrosis. Bladder is unremarkable. Stomach/Bowel: Stomach is within normal limits. There are pericecal inflammatory changes with small amount of extraluminal air and adjacent fat stranding, most consistent with perforated appendix, likely a subacute process.  Vascular/Lymphatic: No significant vascular findings are present. No enlarged abdominal or pelvic lymph nodes. Reproductive: Prostate is unremarkable. Other: No abdominal wall hernia or abnormality. No abdominopelvic ascites. 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 IMPRESSION: 1. Pericecal inflammatory changes with small focus of extraluminal free air most consistent with perforated appendix with surrounding inflammatory changes/developing abscess/fluid collection. Surgical consultation for further management is recommended. 2. Bowel loops are normal in caliber without evidence of obstruction. 3. No evidence of nephrolithiasis or hydronephrosis. Simple cyst in the upper pole of the left kidney, which does not require follow-up. Above findings and recommendations were reported to PA  Sophia at approximately 5:05 p.m. on 09/04/2021. Electronically Signed   By: Keane Police D.O.   On: 09/04/2021 17:08   DG Chest 2 View  Result Date: 09/04/2021 CLINICAL DATA:  Nausea, vomiting, abdominal pain EXAM: CHEST - 2 VIEW COMPARISON:  03/23/2009 FINDINGS: Transverse diameter heart is in upper limits of normal. There are no signs of pulmonary edema or focal pulmonary consolidation. There is poor inspiration. There is no pleural effusion or pneumothorax. IMPRESSION: There are no signs of pulmonary edema or focal pulmonary consolidation. Electronically Signed   By: Elmer Picker M.D.   On: 09/04/2021 15:11    Labs:  CBC: Recent Labs    09/04/21 1242 09/06/21 0217 09/07/21 0300 09/08/21 0242  WBC 14.6* 16.7* 15.5* 21.4*  HGB 14.8 13.8 13.0 12.6*  HCT 42.0 40.5 36.8* 36.5*  PLT 331 337 342 352    COAGS: Recent Labs    09/08/21 0242  INR 1.4*    BMP: Recent Labs    09/05/21 1103 09/06/21 0217 09/07/21 0300 09/08/21 0242  NA 133* 135 135 130*  K 3.7 3.4* 3.5 3.6  CL 98 96* 99 99  CO2 24 29 26 22   GLUCOSE 131* 126* 145* 157*  BUN 7 7 8 6   CALCIUM 8.0* 8.1* 8.2*  7.9*  CREATININE 1.07 1.35* 1.20 1.13  GFRNONAA >60 >60 >60 >60    LIVER FUNCTION TESTS: Recent Labs    09/04/21 1242  BILITOT 0.6  AST 22  ALT 24  ALKPHOS 64  PROT 8.4*  ALBUMIN 4.2    TUMOR MARKERS: No results for input(s): "AFPTM", "CEA", "CA199", "CHROMGRNA" in the last 8760 hours.  Assessment and Plan:  Ryan Herman is a 41 yo male with PMH of OSA, anxiety, and obesity being seen today for a perforated appendix with accompanying abscess. IR was consulted to perform image-guided drain placement. Case has been reviewed by Dr Denna Haggard, and patient approved for procedure on 7/26.   Risks and benefits discussed with the patient including bleeding, infection, damage to adjacent structures, bowel perforation/fistula connection, and sepsis.  All of the patient's questions were answered, patient is agreeable to proceed. Consent signed and in chart.  Thank you for this interesting consult.  I greatly enjoyed meeting Shreyansh Clemenson and look forward to participating in their care.  A copy of this report was sent to the requesting provider on this date.  Electronically Signed: Lura Em, PA-C 09/08/2021, 9:18 AM   I spent a total of 20 Minutes    in face to face in clinical consultation, greater than 50% of which was counseling/coordinating care for perforated appendix.

## 2021-09-09 LAB — CULTURE, BLOOD (ROUTINE X 2): Culture: NO GROWTH

## 2021-09-09 LAB — CBC
HCT: 36.7 % — ABNORMAL LOW (ref 39.0–52.0)
Hemoglobin: 12.8 g/dL — ABNORMAL LOW (ref 13.0–17.0)
MCH: 31.4 pg (ref 26.0–34.0)
MCHC: 34.9 g/dL (ref 30.0–36.0)
MCV: 90 fL (ref 80.0–100.0)
Platelets: 377 10*3/uL (ref 150–400)
RBC: 4.08 MIL/uL — ABNORMAL LOW (ref 4.22–5.81)
RDW: 12.8 % (ref 11.5–15.5)
WBC: 14.5 10*3/uL — ABNORMAL HIGH (ref 4.0–10.5)
nRBC: 0 % (ref 0.0–0.2)

## 2021-09-09 LAB — BASIC METABOLIC PANEL
Anion gap: 12 (ref 5–15)
BUN: 6 mg/dL (ref 6–20)
CO2: 25 mmol/L (ref 22–32)
Calcium: 8.3 mg/dL — ABNORMAL LOW (ref 8.9–10.3)
Chloride: 99 mmol/L (ref 98–111)
Creatinine, Ser: 1.07 mg/dL (ref 0.61–1.24)
GFR, Estimated: >60 mL/min (ref >60)
Glucose, Bld: 156 mg/dL — ABNORMAL HIGH (ref 70–99)
Potassium: 3.8 mmol/L (ref 3.5–5.1)
Sodium: 136 mmol/L (ref 135–145)

## 2021-09-09 MED ORDER — SODIUM CHLORIDE 0.9 % IV SOLN
INTRAVENOUS | Status: DC
Start: 2021-09-09 — End: 2021-09-12

## 2021-09-09 MED ORDER — SODIUM CHLORIDE 0.9 % IV BOLUS
1000.0000 mL | Freq: Once | INTRAVENOUS | Status: AC
  Administered 2021-09-09: 1000 mL via INTRAVENOUS

## 2021-09-09 NOTE — Progress Notes (Signed)
Subjective: CC: S/p RLQ IR drainage yesterday with purulent material aspirated and 46F drain left . Febrile to 103 overnight. Tachycardic in the 100's. BP 91/57 this am.  Reports he is feeling better however. Pain after drain placement yesterday is much improved. Some RLQ pain. Denies n/v. Reports he has been having 10 liquid stools between yesterday and today. Denies cp or sob. No urinary symptoms. Mobilizing.   Objective: Vital signs in last 24 hours: Temp:  [98.5 F (36.9 C)-103 F (39.4 C)] 100.1 F (37.8 C) (07/27 0930) Pulse Rate:  [100-115] 101 (07/27 0930) Resp:  [17-31] 18 (07/27 0930) BP: (91-131)/(57-92) 91/57 (07/27 0930) SpO2:  [95 %-100 %] 98 % (07/26 1925) Last BM Date : 09/08/21  Intake/Output from previous day: 07/26 0701 - 07/27 0700 In: -  Out: 205 [Drains:205] Intake/Output this shift: No intake/output data recorded.  PE: Gen:  Alert, NAD, pleasant Card:  Tachycardic   Pulm:  CTAB, no W/R/R, effort normal Abd: Protuberant with question of some distension but very soft, mild RLQ ttp without rigidity or guarding. Otherwise NT. +BS Ext:  No LE edema or calf ttp Psych: A&Ox3   Lab Results:  Recent Labs    09/08/21 0242 09/09/21 0411  WBC 21.4* 14.5*  HGB 12.6* 12.8*  HCT 36.5* 36.7*  PLT 352 377   BMET Recent Labs    09/08/21 0242 09/09/21 0411  NA 130* 136  K 3.6 3.8  CL 99 99  CO2 22 25  GLUCOSE 157* 156*  BUN 6 6  CREATININE 1.13 1.07  CALCIUM 7.9* 8.3*   PT/INR Recent Labs    09/08/21 0242  LABPROT 16.6*  INR 1.4*   CMP     Component Value Date/Time   NA 136 09/09/2021 0411   NA 139 03/19/2018 1440   K 3.8 09/09/2021 0411   CL 99 09/09/2021 0411   CO2 25 09/09/2021 0411   GLUCOSE 156 (H) 09/09/2021 0411   BUN 6 09/09/2021 0411   BUN 13 03/19/2018 1440   CREATININE 1.07 09/09/2021 0411   CREATININE 1.01 02/17/2016 1017   CALCIUM 8.3 (L) 09/09/2021 0411   PROT 8.4 (H) 09/04/2021 1242   PROT 7.3 03/19/2018 1440    ALBUMIN 4.2 09/04/2021 1242   ALBUMIN 4.3 03/19/2018 1440   AST 22 09/04/2021 1242   ALT 24 09/04/2021 1242   ALKPHOS 64 09/04/2021 1242   BILITOT 0.6 09/04/2021 1242   BILITOT 0.4 03/19/2018 1440   GFRNONAA >60 09/09/2021 0411   GFRAA 97 03/19/2018 1440   Lipase  No results found for: "LIPASE"  Studies/Results: CT IMAGE GUIDED DRAINAGE BY PERCUTANEOUS CATHETER  Result Date: 09/08/2021 INDICATION: Right lower quadrant abscess, ruptured appendix EXAM: CT GUIDED DRAINAGE OF right lower quadrant abdominal ABSCESS TECHNIQUE: Multidetector CT imaging of the abdomen was performed following the standard protocol without IV contrast. RADIATION DOSE REDUCTION: This exam was performed according to the departmental dose-optimization program which includes automated exposure control, adjustment of the mA and/or kV according to patient size and/or use of iterative reconstruction technique. MEDICATIONS: The patient is currently admitted to the hospital and receiving intravenous antibiotics. The antibiotics were administered within an appropriate time frame prior to the initiation of the procedure. ANESTHESIA/SEDATION: Moderate (conscious) sedation was employed during this procedure. A total of Versed 3 mg and Fentanyl 100 mcg was administered intravenously by the radiology nurse. Total intra-service moderate Sedation Time: 30 minutes. The patient's level of consciousness and vital signs were monitored continuously by  radiology nursing throughout the procedure under my direct supervision. COMPLICATIONS: None immediate. TECHNIQUE: Informed written consent was obtained from the patient after a thorough discussion of the procedural risks, benefits and alternatives. All questions were addressed. Maximal Sterile Barrier Technique was utilized including caps, mask, sterile gowns, sterile gloves, sterile drape, hand hygiene and skin antiseptic. A timeout was performed prior to the initiation of the procedure.  PROCEDURE: The patient was placed supine on the exam table. Limited CT of the abdomen and pelvis was performed for planning purposes. This demonstrated abscess in the right lower quadrant. Skin entry site was marked, and the overlying skin was prepped and draped in the standard sterile fashion. Local analgesia was obtained with 1% lidocaine. Using intermittent CT fluoroscopy, a 19 gauge Yueh needle was advanced towards the right lower quadrant abscess. Location was confirmed with CT and return of purulent material. An 035 Amplatz wire was then advanced into the collection, followed by serial tract dilation and placement of a 12 French multipurpose locking drainage catheter within the right lower quadrant abscess. Location was again confirmed with CT and return of additional purulent material. A sample was sent to the lab for analysis. The drainage catheter was placed to bulb suction. It was attached to the skin using silk suture and a dressing. The patient tolerated the procedure well, and was transferred to recovery in stable condition. FINDINGS: See above. IMPRESSION: Successful CT-guided placement of a 12 French locking drainage catheter into the right lower quadrant intra-abdominal abscess. Drainage catheter placed to bulb suction. A sample was sent to the lab for microbiology analysis. Electronically Signed   By: Olive Bass M.D.   On: 09/08/2021 14:00   CT ABDOMEN PELVIS W CONTRAST  Result Date: 09/07/2021 CLINICAL DATA:  Acute abdominal pain EXAM: CT ABDOMEN AND PELVIS WITH CONTRAST TECHNIQUE: Multidetector CT imaging of the abdomen and pelvis was performed using the standard protocol following bolus administration of intravenous contrast. RADIATION DOSE REDUCTION: This exam was performed according to the departmental dose-optimization program which includes automated exposure control, adjustment of the mA and/or kV according to patient size and/or use of iterative reconstruction technique. CONTRAST:   OMNIPAQUE IOHEXOL 300 MG/ML  SOLN COMPARISON:  CT 09/04/2021. FINDINGS: Lower chest: No acute abnormality. Hepatobiliary: No focal liver abnormality is seen. No gallstones, gallbladder wall thickening, or biliary dilatation. Pancreas: Unremarkable. No pancreatic ductal dilatation or surrounding inflammatory changes. Spleen: Normal in size without focal abnormality. Adrenals/Urinary Tract: Adrenal glands are unremarkable. Kidneys are normal, without renal calculi, focal lesion, or hydronephrosis. Simple renal cyst left kidney. Bladder is unremarkable. Stomach/Bowel: Increased pericecal inflammatory changes including increased size of the loculated fluid and gas collection in the right lower quadrant now measuring approximally 7.5 x 7.0 cm in the axial plane, previously 6.1 x 4.8 cm. Increased gaseous component of this collection. Increased adjacent inflammatory stranding. Increased wall thickening and mucosal hyperenhancement of the adjacent sigmoid colon and cecum. Communication with the sigmoid colon is not excluded. Stomach and small bowel are within normal limits. Vascular/Lymphatic: No significant vascular findings are present. No enlarged abdominal or pelvic lymph nodes. Reproductive: Prostate is unremarkable. Other: Small bilateral fat containing inguinal hernias. Musculoskeletal: Subcutaneous gas in the anterior abdominal likely related to injections. IMPRESSION: 1. Increased inflammatory changes in the pericecal region compatible with perforated appendicitis including increased size of the air and gas collection/abscess. 2. The inflammatory process abuts the sigmoid colon which also demonstrates marked inflammatory changes. Communication with the sigmoid colon is not excluded. Electronically Signed  By: Minerva Fester M.D.   On: 09/07/2021 14:10    Anti-infectives: Anti-infectives (From admission, onward)    Start     Dose/Rate Route Frequency Ordered Stop   09/05/21 1000  valACYclovir  (VALTREX) tablet 500 mg        500 mg Oral Daily 09/04/21 2321     09/05/21 0015  piperacillin-tazobactam (ZOSYN) IVPB 3.375 g        3.375 g 12.5 mL/hr over 240 Minutes Intravenous Every 8 hours 09/04/21 2321 09/11/21 2159   09/04/21 1730  piperacillin-tazobactam (ZOSYN) IVPB 3.375 g        3.375 g 100 mL/hr over 30 Minutes Intravenous  Once 09/04/21 1719 09/04/21 1907        Assessment/Plan Acute perforated appendicitis with abscess - S/p IR drain 7/26. Cx's pending, current with abundant gram negative rods.  - Cont IV abx - Daily labs - Mobilize/Pulm toilet - Hopefully will be able to improve with conservative management and avoid surgery. He was febrile, tachycardic overnight with soft bp this am. Giving IVF bolus. Clinically however, he seems to be improving with less pain, minimal ttp and downtrending wbc. Continue to monitor. Also will monitor diarrhea to see if further testing is needed.    FEN - Sips/chips from the floor, IVF bolus and then maintenance at 170ml/hr VTE - SCDs, Lovenox ID - Zosyn 7/22 >>  Febrile to 103 overnight. Tachycardic in the 100's. BP 91/57 this am. NS bolus and then maintence fluids   Hyponatremia - resolved  AKI - resolved Hypokalemia - resolved HTN - Soft BP this am. Hold amlodipine. PRN hydralazine. Lisinopril was held in setting of elevated Cr - continue to hold today.    I reviewed nursing notes, last 24 h vitals and pain scores, last 48 h intake and output, last 24 h labs and trends, and last 24 h imaging results.   LOS: 5 days    Jacinto Halim , [facility] 09/09/2021, 9:49 AM Please see Amion for pager number during day hours 7:00am-4:30pm

## 2021-09-09 NOTE — Plan of Care (Signed)
  Problem: Education: Goal: Knowledge of General Education information will improve Description: Including pain rating scale, medication(s)/side effects and non-pharmacologic comfort measures Outcome: Progressing   Problem: Health Behavior/Discharge Planning: Goal: Ability to manage health-related needs will improve Outcome: Progressing   Problem: Clinical Measurements: Goal: Ability to maintain clinical measurements within normal limits will improve Outcome: Progressing Goal: Diagnostic test results will improve Outcome: Progressing Goal: Respiratory complications will improve Outcome: Not Applicable Goal: Cardiovascular complication will be avoided Outcome: Progressing   Problem: Elimination: Goal: Will not experience complications related to bowel motility Outcome: Progressing Goal: Will not experience complications related to urinary retention Outcome: Progressing   Problem: Pain Managment: Goal: General experience of comfort will improve Outcome: Progressing   Problem: Safety: Goal: Ability to remain free from injury will improve Outcome: Progressing   Problem: Skin Integrity: Goal: Risk for impaired skin integrity will decrease Outcome: Progressing

## 2021-09-09 NOTE — Progress Notes (Signed)
   09/09/21 0809  Assess: MEWS Score  Temp (!) 102 F (38.9 C)  BP 103/64  MAP (mmHg) 75  Pulse Rate (!) 104  Resp 18  Level of Consciousness Alert  Assess: MEWS Score  MEWS Temp 2  MEWS Systolic 0  MEWS Pulse 1  MEWS RR 0  MEWS LOC 0  MEWS Score 3  MEWS Score Color Yellow  Assess: if the MEWS score is Yellow or Red  Were vital signs taken at a resting state? Yes  Focused Assessment No change from prior assessment  Does the patient meet 2 or more of the SIRS criteria? Yes  Does the patient have a confirmed or suspected source of infection? Yes  Provider and Rapid Response Notified? No  MEWS guidelines implemented *See Row Information* No, previously yellow, continue vital signs every 4 hours  Assess: SIRS CRITERIA  SIRS Temperature  1  SIRS Pulse 1  SIRS Respirations  0  SIRS WBC 1  SIRS Score Sum  3

## 2021-09-09 NOTE — Progress Notes (Signed)
Referring Physician(s): Barnetta Chapel PA-C  Supervising Physician: Malachy Moan  Patient Status:  [facility] - In-pt  Chief Complaint:  S/p RLQ drain placement for perforated appendicitis with abscess by Dr. Juliette Alcide on 7/26.   Subjective:  Patient laying in bed, NAD.  States that he is feeling better than yesterday, denies fever, chills, N/V.  Has mild abdominal pain around the drain.  Briefly discussed about drain care and follow up after discharge.   Allergies: Patient has no known allergies.  Medications: Prior to Admission medications   Medication Sig Start Date End Date Taking? Authorizing Provider  amLODipine (NORVASC) 10 MG tablet TAKE 1 TABLET BY MOUTH EVERY DAY Patient taking differently: Take 10 mg by mouth daily. 01/17/19  Yes Tysinger, [name], PA-C  hydrochlorothiazide (HYDRODIURIL) 25 MG tablet Take 25 mg by mouth daily. 08/12/21  Yes [provider]  metoprolol succinate (TOPROL-XL) 25 MG 24 hr tablet Take 25 mg by mouth daily. 06/11/21  Yes [provider]  Multiple Vitamins-Minerals (MULTIVITAMIN WITH MINERALS) tablet Take 1 tablet by mouth daily.   Yes [provider]  valACYclovir (VALTREX) 500 MG tablet TAKE 1 TABLET BY MOUTH EVERY DAY Patient taking differently: Take 500 mg by mouth daily. 12/21/18  Yes Tysinger, [name], PA-C  CLARAVIS 40 MG capsule Take 40 mg by mouth 2 (two) times daily. Patient not taking: Reported on 09/05/2021 05/31/21   [provider]  quinapril (ACCUPRIL) 10 MG tablet Take 10 mg by mouth daily. Patient not taking: Reported on 09/05/2021    [provider]     Vital Signs: BP (!) 91/57 (BP Location: Right Arm)   Pulse (!) 101   Temp 100.1 F (37.8 C) (Oral)   Resp 18   Ht 6\' 1"  (1.854 m) Comment: per pt report  Wt 275 lb 2.2 oz (124.8 kg)   SpO2 98%   BMI 36.30 kg/m   Physical Exam Vitals reviewed.  Constitutional:      General: He is not in acute distress.    Appearance: He is  well-developed.  HENT:     Head: Normocephalic.  Pulmonary:     Effort: Pulmonary effort is normal.  Skin:    General: Skin is warm and dry.     Coloration: Skin is not cyanotic or jaundiced.     Comments: Positive RLQ drain drain to a suction bulb. Site is unremarkable with no erythema, edema, tenderness, bleeding or drainage. Suture and stat lock in place. Dressing is clean, dry, and intact. 10 ml of  feculent colored fluid noted in the bulb. Drain aspirates and flushes well.    Neurological:     Mental Status: He is alert and oriented to person, place, and time.  Psychiatric:        Mood and Affect: Mood normal.        Behavior: Behavior normal.     Imaging: CT IMAGE GUIDED DRAINAGE BY PERCUTANEOUS CATHETER  Result Date: 09/08/2021 INDICATION: Right lower quadrant abscess, ruptured appendix EXAM: CT GUIDED DRAINAGE OF right lower quadrant abdominal ABSCESS TECHNIQUE: Multidetector CT imaging of the abdomen was performed following the standard protocol without IV contrast. RADIATION DOSE REDUCTION: This exam was performed according to the departmental dose-optimization program which includes automated exposure control, adjustment of the mA and/or kV according to patient size and/or use of iterative reconstruction technique. MEDICATIONS: The patient is currently admitted to the hospital and receiving intravenous antibiotics. The antibiotics were administered within an appropriate time frame prior to the  initiation of the procedure. ANESTHESIA/SEDATION: Moderate (conscious) sedation was employed during this procedure. A total of Versed 3 mg and Fentanyl 100 mcg was administered intravenously by the radiology nurse. Total intra-service moderate Sedation Time: 30 minutes. The patient's level of consciousness and vital signs were monitored continuously by radiology nursing throughout the procedure under my direct supervision. COMPLICATIONS: None immediate. TECHNIQUE: Informed written consent was  obtained from the patient after a thorough discussion of the procedural risks, benefits and alternatives. All questions were addressed. Maximal Sterile Barrier Technique was utilized including caps, mask, sterile gowns, sterile gloves, sterile drape, hand hygiene and skin antiseptic. A timeout was performed prior to the initiation of the procedure. PROCEDURE: The patient was placed supine on the exam table. Limited CT of the abdomen and pelvis was performed for planning purposes. This demonstrated abscess in the right lower quadrant. Skin entry site was marked, and the overlying skin was prepped and draped in the standard sterile fashion. Local analgesia was obtained with 1% lidocaine. Using intermittent CT fluoroscopy, a 19 gauge Yueh needle was advanced towards the right lower quadrant abscess. Location was confirmed with CT and return of purulent material. An 035 Amplatz wire was then advanced into the collection, followed by serial tract dilation and placement of a 12 French multipurpose locking drainage catheter within the right lower quadrant abscess. Location was again confirmed with CT and return of additional purulent material. A sample was sent to the lab for analysis. The drainage catheter was placed to bulb suction. It was attached to the skin using silk suture and a dressing. The patient tolerated the procedure well, and was transferred to recovery in stable condition. FINDINGS: See above. IMPRESSION: Successful CT-guided placement of a 12 French locking drainage catheter into the right lower quadrant intra-abdominal abscess. Drainage catheter placed to bulb suction. A sample was sent to the lab for microbiology analysis. Electronically Signed   By: Olive Bass M.D.   On: 09/08/2021 14:00   CT ABDOMEN PELVIS W CONTRAST  Result Date: 09/07/2021 CLINICAL DATA:  Acute abdominal pain EXAM: CT ABDOMEN AND PELVIS WITH CONTRAST TECHNIQUE: Multidetector CT imaging of the abdomen and pelvis was performed  using the standard protocol following bolus administration of intravenous contrast. RADIATION DOSE REDUCTION: This exam was performed according to the departmental dose-optimization program which includes automated exposure control, adjustment of the mA and/or kV according to patient size and/or use of iterative reconstruction technique. CONTRAST:  OMNIPAQUE IOHEXOL 300 MG/ML  SOLN COMPARISON:  CT 09/04/2021. FINDINGS: Lower chest: No acute abnormality. Hepatobiliary: No focal liver abnormality is seen. No gallstones, gallbladder wall thickening, or biliary dilatation. Pancreas: Unremarkable. No pancreatic ductal dilatation or surrounding inflammatory changes. Spleen: Normal in size without focal abnormality. Adrenals/Urinary Tract: Adrenal glands are unremarkable. Kidneys are normal, without renal calculi, focal lesion, or hydronephrosis. Simple renal cyst left kidney. Bladder is unremarkable. Stomach/Bowel: Increased pericecal inflammatory changes including increased size of the loculated fluid and gas collection in the right lower quadrant now measuring approximally 7.5 x 7.0 cm in the axial plane, previously 6.1 x 4.8 cm. Increased gaseous component of this collection. Increased adjacent inflammatory stranding. Increased wall thickening and mucosal hyperenhancement of the adjacent sigmoid colon and cecum. Communication with the sigmoid colon is not excluded. Stomach and small bowel are within normal limits. Vascular/Lymphatic: No significant vascular findings are present. No enlarged abdominal or pelvic lymph nodes. Reproductive: Prostate is unremarkable. Other: Small bilateral fat containing inguinal hernias. Musculoskeletal: Subcutaneous gas in the anterior abdominal likely related  to injections. IMPRESSION: 1. Increased inflammatory changes in the pericecal region compatible with perforated appendicitis including increased size of the air and gas collection/abscess. 2. The inflammatory process abuts the  sigmoid colon which also demonstrates marked inflammatory changes. Communication with the sigmoid colon is not excluded. Electronically Signed   By: Minerva Fester M.D.   On: 09/07/2021 14:10    Labs:  CBC: Recent Labs    09/06/21 0217 09/07/21 0300 09/08/21 0242 09/09/21 0411  WBC 16.7* 15.5* 21.4* 14.5*  HGB 13.8 13.0 12.6* 12.8*  HCT 40.5 36.8* 36.5* 36.7*  PLT 337 342 352 377    COAGS: Recent Labs    09/08/21 0242  INR 1.4*    BMP: Recent Labs    09/06/21 0217 09/07/21 0300 09/08/21 0242 09/09/21 0411  NA 135 135 130* 136  K 3.4* 3.5 3.6 3.8  CL 96* 99 99 99  CO2 29 26 22 25   GLUCOSE 126* 145* 157* 156*  BUN 7 8 6 6   CALCIUM 8.1* 8.2* 7.9* 8.3*  CREATININE 1.35* 1.20 1.13 1.07  GFRNONAA >60 >60 >60 >60    LIVER FUNCTION TESTS: Recent Labs    09/04/21 1242  BILITOT 0.6  AST 22  ALT 24  ALKPHOS 64  PROT 8.4*  ALBUMIN 4.2    Assessment and Plan:  41 y.o. male with perforated appendicitis with intraabdominal abscess s/p RLW drain placement by Dr. 09/06/21 on 7/26.   OP 205 mL, feculent  Hgb stable, WBC trended down 14.5 (21.4) but patient febrile, Tmax 102 overnight, patient tachycardic as well  Cx pending, gram neg  rods seen so far   Drain Location: RLQ Size: Fr size: 12 Fr Date of placement: 7/26  Currently to: Drain collection device: suction bulb 24 hour output:  Output by Drain (mL) 09/07/21 0701 - 09/07/21 1900 09/07/21 1901 - 09/08/21 0700 09/08/21 0701 - 09/08/21 1900 09/08/21 1901 - 09/09/21 0700 09/09/21 0701 - 09/09/21 0941  Closed System Drain 1 Lateral RLQ Bulb (JP) 12 Fr.   100 105     Interval imaging/drain manipulation:  None   Current examination: Flushes/aspirates easily.  Insertion site unremarkable. Suture and stat lock in place. Dressed appropriately.   Plan: Continue TID flushes with 5 cc NS. Record output Q shift. Dressing changes QD or PRN if soiled.  Call IR APP or on call IR MD if difficulty flushing or  sudden change in drain output.  Repeat imaging/possible drain injection once output < 10 mL/QD (excluding flush material). Consideration for drain removal if output is < 10 mL/QD (excluding flush material), pending discussion with the providing surgical service.  Discharge planning: Please contact IR APP or on call IR MD prior to patient d/c to ensure appropriate follow up plans are in place. Typically patient will follow up with IR clinic 10-14 days post d/c for repeat imaging/possible drain injection. IR scheduler will contact patient with date/time of appointment. Patient will need to flush drain QD with 5 cc NS, record output QD, dressing changes every 2-3 days or earlier if soiled.   IR will continue to follow - please call with questions or concerns.    Electronically Signed: 09/11/21, PA-C 09/09/2021, 9:41 AM   I spent a total of 15 Minutes at the the patient's bedside AND on the patient's hospital floor or unit, greater than 50% of which was counseling/coordinating care for RLQ drain f/u.   This chart was dictated using voice recognition software.  Despite best efforts to proofread,  errors can occur which can change the documentation meaning.

## 2021-09-09 NOTE — Progress Notes (Signed)
Pt states he will self administer his home CPAP when he is ready for rest.

## 2021-09-10 ENCOUNTER — Other Ambulatory Visit (HOSPITAL_COMMUNITY): Payer: Self-pay

## 2021-09-10 LAB — CBC
HCT: 36.5 % — ABNORMAL LOW (ref 39.0–52.0)
Hemoglobin: 12.4 g/dL — ABNORMAL LOW (ref 13.0–17.0)
MCH: 31 pg (ref 26.0–34.0)
MCHC: 34 g/dL (ref 30.0–36.0)
MCV: 91.3 fL (ref 80.0–100.0)
Platelets: 404 10*3/uL — ABNORMAL HIGH (ref 150–400)
RBC: 4 MIL/uL — ABNORMAL LOW (ref 4.22–5.81)
RDW: 12.7 % (ref 11.5–15.5)
WBC: 12.3 10*3/uL — ABNORMAL HIGH (ref 4.0–10.5)
nRBC: 0 % (ref 0.0–0.2)

## 2021-09-10 LAB — BASIC METABOLIC PANEL
Anion gap: 7 (ref 5–15)
BUN: 7 mg/dL (ref 6–20)
CO2: 24 mmol/L (ref 22–32)
Calcium: 8.1 mg/dL — ABNORMAL LOW (ref 8.9–10.3)
Chloride: 105 mmol/L (ref 98–111)
Creatinine, Ser: 0.99 mg/dL (ref 0.61–1.24)
GFR, Estimated: >60 mL/min (ref >60)
Glucose, Bld: 118 mg/dL — ABNORMAL HIGH (ref 70–99)
Potassium: 3.7 mmol/L (ref 3.5–5.1)
Sodium: 136 mmol/L (ref 135–145)

## 2021-09-10 MED ORDER — AMLODIPINE BESYLATE 5 MG PO TABS
5.0000 mg | ORAL_TABLET | Freq: Every day | ORAL | Status: DC
  Administered 2021-09-10 – 2021-09-12 (×3): 5 mg via ORAL
  Filled 2021-09-10 (×3): qty 1

## 2021-09-10 MED ORDER — SODIUM CHLORIDE FLUSH 0.9 % IV SOLN
10.0000 mL | INTRAVENOUS | 0 refills | Status: DC | PRN
  Filled 2021-09-10: qty 300, 30d supply, fill #0

## 2021-09-10 NOTE — Progress Notes (Signed)
Chief Complaint/Subjective: Pain much improved, tolerating liquids  Objective: Vital signs in last 24 hours: Temp:  [98.4 F (36.9 C)-100.8 F (38.2 C)] 100.1 F (37.8 C) (07/28 0846) Pulse Rate:  [90-106] 97 (07/28 0846) Resp:  [14-20] 20 (07/28 0846) BP: (114-142)/(62-91) 142/78 (07/28 0846) SpO2:  [96 %] 96 % (07/28 0521) Last BM Date : 09/09/21 Intake/Output from previous day: 07/27 0701 - 07/28 0700 In: 570.4 [I.V.:232; IV Piggyback:338.4] Out: 30 [Drains:30] Intake/Output this shift: Total I/O In: 1695.9 [I.V.:1695.9] Out: -   PE: Gen: NAD Resp: nonlabored Card: RRR Abd: soft, NT, drain with yellow thick output  Lab Results:  Recent Labs    09/09/21 0411 09/10/21 0639  WBC 14.5* 12.3*  HGB 12.8* 12.4*  HCT 36.7* 36.5*  PLT 377 404*   BMET Recent Labs    09/09/21 0411 09/10/21 0639  NA 136 136  K 3.8 3.7  CL 99 105  CO2 25 24  GLUCOSE 156* 118*  BUN 6 7  CREATININE 1.07 0.99  CALCIUM 8.3* 8.1*   PT/INR Recent Labs    09/08/21 0242  LABPROT 16.6*  INR 1.4*   CMP     Component Value Date/Time   NA 136 09/10/2021 0639   NA 139 03/19/2018 1440   K 3.7 09/10/2021 0639   CL 105 09/10/2021 0639   CO2 24 09/10/2021 0639   GLUCOSE 118 (H) 09/10/2021 0639   BUN 7 09/10/2021 0639   BUN 13 03/19/2018 1440   CREATININE 0.99 09/10/2021 0639   CREATININE 1.01 02/17/2016 1017   CALCIUM 8.1 (L) 09/10/2021 0639   PROT 8.4 (H) 09/04/2021 1242   PROT 7.3 03/19/2018 1440   ALBUMIN 4.2 09/04/2021 1242   ALBUMIN 4.3 03/19/2018 1440   AST 22 09/04/2021 1242   ALT 24 09/04/2021 1242   ALKPHOS 64 09/04/2021 1242   BILITOT 0.6 09/04/2021 1242   BILITOT 0.4 03/19/2018 1440   GFRNONAA >60 09/10/2021 0639   GFRAA 97 03/19/2018 1440   Lipase  No results found for: "LIPASE"  Studies/Results: CT IMAGE GUIDED DRAINAGE BY PERCUTANEOUS CATHETER  Result Date: 09/08/2021 INDICATION: Right lower quadrant abscess, ruptured appendix EXAM: CT GUIDED  DRAINAGE OF right lower quadrant abdominal ABSCESS TECHNIQUE: Multidetector CT imaging of the abdomen was performed following the standard protocol without IV contrast. RADIATION DOSE REDUCTION: This exam was performed according to the departmental dose-optimization program which includes automated exposure control, adjustment of the mA and/or kV according to patient size and/or use of iterative reconstruction technique. MEDICATIONS: The patient is currently admitted to the hospital and receiving intravenous antibiotics. The antibiotics were administered within an appropriate time frame prior to the initiation of the procedure. ANESTHESIA/SEDATION: Moderate (conscious) sedation was employed during this procedure. A total of Versed 3 mg and Fentanyl 100 mcg was administered intravenously by the radiology nurse. Total intra-service moderate Sedation Time: 30 minutes. The patient's level of consciousness and vital signs were monitored continuously by radiology nursing throughout the procedure under my direct supervision. COMPLICATIONS: None immediate. TECHNIQUE: Informed written consent was obtained from the patient after a thorough discussion of the procedural risks, benefits and alternatives. All questions were addressed. Maximal Sterile Barrier Technique was utilized including caps, mask, sterile gowns, sterile gloves, sterile drape, hand hygiene and skin antiseptic. A timeout was performed prior to the initiation of the procedure. PROCEDURE: The patient was placed supine on the exam table. Limited CT of the abdomen and pelvis was performed for planning purposes. This demonstrated abscess in  the right lower quadrant. Skin entry site was marked, and the overlying skin was prepped and draped in the standard sterile fashion. Local analgesia was obtained with 1% lidocaine. Using intermittent CT fluoroscopy, a 19 gauge Yueh needle was advanced towards the right lower quadrant abscess. Location was confirmed with CT and  return of purulent material. An 035 Amplatz wire was then advanced into the collection, followed by serial tract dilation and placement of a 12 French multipurpose locking drainage catheter within the right lower quadrant abscess. Location was again confirmed with CT and return of additional purulent material. A sample was sent to the lab for analysis. The drainage catheter was placed to bulb suction. It was attached to the skin using silk suture and a dressing. The patient tolerated the procedure well, and was transferred to recovery in stable condition. FINDINGS: See above. IMPRESSION: Successful CT-guided placement of a 12 French locking drainage catheter into the right lower quadrant intra-abdominal abscess. Drainage catheter placed to bulb suction. A sample was sent to the lab for microbiology analysis. Electronically Signed   By: Olive Bass M.D.   On: 09/08/2021 14:00    Anti-infectives: Anti-infectives (From admission, onward)    Start     Dose/Rate Route Frequency Ordered Stop   09/05/21 1000  valACYclovir (VALTREX) tablet 500 mg        500 mg Oral Daily 09/04/21 2321     09/05/21 0015  piperacillin-tazobactam (ZOSYN) IVPB 3.375 g        3.375 g 12.5 mL/hr over 240 Minutes Intravenous Every 8 hours 09/04/21 2321 09/11/21 2159   09/04/21 1730  piperacillin-tazobactam (ZOSYN) IVPB 3.375 g        3.375 g 100 mL/hr over 30 Minutes Intravenous  Once 09/04/21 1719 09/04/21 1907       Assessment/Plan Acute perforated appendicitis with abscess - S/p IR drain 7/26. Cx's pending, current with abundant gram negative rods.  - Cont IV abx - Daily labs - Mobilize/Pulm toilet - Hopefully will be able to improve with conservative management and avoid surgery. He was febrile, tachycardic overnight with soft bp this am. Giving IVF bolus. Clinically however, he seems to be improving with less pain, minimal ttp and downtrending wbc. Continue to monitor. Also will monitor diarrhea to see if further  testing is needed.    FEN - full liquids VTE - SCDs, Lovenox ID - Zosyn 7/22 >>  Febrile to 100.8 overnight. Tachycardia resolved   Hyponatremia - resolved  AKI - resolved Hypokalemia - resolved HTN - restart amlodipine. PRN hydralazine. Lisinopril was held in setting of elevated Cr - continue to hold today.    LOS: 6 days   I reviewed last 24 h vitals and pain scores, last 48 h intake and output, last 24 h labs and trends, and last 24 h imaging results.  This care required high  level of medical decision making.   De Blanch [facility] Surgery 09/10/2021, 11:38 AM Please see Amion for pager number during day hours 7:00am-4:30pm or 7:00am -11:30am on weekends

## 2021-09-10 NOTE — Progress Notes (Signed)
Referring Physician(s):   Supervising Physician: Corrie Mckusick  Patient Status:  [facility] - In-pt  Chief Complaint: Perforated appendicitis with abscess formation s/p RLQ drain placement by Dr. Denna Haggard 09/08/21  Subjective: Patient in bed, awake and alert. His daughter is at the bedside. He denies pain/discomfort. He has been started on a full liquid diet. He is unsure when he will get to go home.   Allergies: Patient has no known allergies.  Medications: Prior to Admission medications   Medication Sig Start Date End Date Taking? Authorizing Provider  amLODipine (NORVASC) 10 MG tablet TAKE 1 TABLET BY MOUTH EVERY DAY Patient taking differently: Take 10 mg by mouth daily. 01/17/19  Yes Tysinger, Camelia Eng, PA-C  hydrochlorothiazide (HYDRODIURIL) 25 MG tablet Take 25 mg by mouth daily. 08/12/21  Yes [provider]  metoprolol succinate (TOPROL-XL) 25 MG 24 hr tablet Take 25 mg by mouth daily. 06/11/21  Yes [provider]  Multiple Vitamins-Minerals (MULTIVITAMIN WITH MINERALS) tablet Take 1 tablet by mouth daily.   Yes [provider]  valACYclovir (VALTREX) 500 MG tablet TAKE 1 TABLET BY MOUTH EVERY DAY Patient taking differently: Take 500 mg by mouth daily. 12/21/18  Yes Tysinger, Camelia Eng, PA-C  CLARAVIS 40 MG capsule Take 40 mg by mouth 2 (two) times daily. Patient not taking: Reported on 09/05/2021 05/31/21   [provider]  quinapril (ACCUPRIL) 10 MG tablet Take 10 mg by mouth daily. Patient not taking: Reported on 09/05/2021    [provider]     Vital Signs: BP (!) 142/78 (BP Location: Right Wrist)   Pulse 97   Temp 100.1 F (37.8 C) (Oral)   Resp 20   Ht 6\' 1"  (1.854 m) Comment: per pt report  Wt 275 lb 2.2 oz (124.8 kg)   SpO2 96%   BMI 36.30 kg/m   Physical Exam Constitutional:      General: He is not in acute distress.    Appearance: He is not ill-appearing.  Pulmonary:     Effort: Pulmonary effort is normal.  Abdominal:      Palpations: Abdomen is soft.     Tenderness: There is no abdominal tenderness.     Comments: RLQ drain to suction. Approximately 30 ml of tan/purulent fluid in bulb. Drain easily flushed with 10 ml NS. Dressing is clean/dry/intact.   Skin:    General: Skin is warm and dry.  Neurological:     Mental Status: He is alert and oriented to person, place, and time.     Imaging: CT IMAGE GUIDED DRAINAGE BY PERCUTANEOUS CATHETER  Result Date: 09/08/2021 INDICATION: Right lower quadrant abscess, ruptured appendix EXAM: CT GUIDED DRAINAGE OF right lower quadrant abdominal ABSCESS TECHNIQUE: Multidetector CT imaging of the abdomen was performed following the standard protocol without IV contrast. RADIATION DOSE REDUCTION: This exam was performed according to the departmental dose-optimization program which includes automated exposure control, adjustment of the mA and/or kV according to patient size and/or use of iterative reconstruction technique. MEDICATIONS: The patient is currently admitted to the hospital and receiving intravenous antibiotics. The antibiotics were administered within an appropriate time frame prior to the initiation of the procedure. ANESTHESIA/SEDATION: Moderate (conscious) sedation was employed during this procedure. A total of Versed 3 mg and Fentanyl 100 mcg was administered intravenously by the radiology nurse. Total intra-service moderate Sedation Time: 30 minutes. The patient's level of consciousness and vital signs were monitored continuously by radiology nursing throughout the procedure under my direct supervision. COMPLICATIONS: None immediate.  TECHNIQUE: Informed written consent was obtained from the patient after a thorough discussion of the procedural risks, benefits and alternatives. All questions were addressed. Maximal Sterile Barrier Technique was utilized including caps, mask, sterile gowns, sterile gloves, sterile drape, hand hygiene and skin antiseptic. A timeout was  performed prior to the initiation of the procedure. PROCEDURE: The patient was placed supine on the exam table. Limited CT of the abdomen and pelvis was performed for planning purposes. This demonstrated abscess in the right lower quadrant. Skin entry site was marked, and the overlying skin was prepped and draped in the standard sterile fashion. Local analgesia was obtained with 1% lidocaine. Using intermittent CT fluoroscopy, a 19 gauge Yueh needle was advanced towards the right lower quadrant abscess. Location was confirmed with CT and return of purulent material. An 035 Amplatz wire was then advanced into the collection, followed by serial tract dilation and placement of a 12 French multipurpose locking drainage catheter within the right lower quadrant abscess. Location was again confirmed with CT and return of additional purulent material. A sample was sent to the lab for analysis. The drainage catheter was placed to bulb suction. It was attached to the skin using silk suture and a dressing. The patient tolerated the procedure well, and was transferred to recovery in stable condition. FINDINGS: See above. IMPRESSION: Successful CT-guided placement of a 12 French locking drainage catheter into the right lower quadrant intra-abdominal abscess. Drainage catheter placed to bulb suction. A sample was sent to the lab for microbiology analysis. Electronically Signed   By: Olive Bass M.D.   On: 09/08/2021 14:00   CT ABDOMEN PELVIS W CONTRAST  Result Date: 09/07/2021 CLINICAL DATA:  Acute abdominal pain EXAM: CT ABDOMEN AND PELVIS WITH CONTRAST TECHNIQUE: Multidetector CT imaging of the abdomen and pelvis was performed using the standard protocol following bolus administration of intravenous contrast. RADIATION DOSE REDUCTION: This exam was performed according to the departmental dose-optimization program which includes automated exposure control, adjustment of the mA and/or kV according to patient size and/or  use of iterative reconstruction technique. CONTRAST:  OMNIPAQUE IOHEXOL 300 MG/ML  SOLN COMPARISON:  CT 09/04/2021. FINDINGS: Lower chest: No acute abnormality. Hepatobiliary: No focal liver abnormality is seen. No gallstones, gallbladder wall thickening, or biliary dilatation. Pancreas: Unremarkable. No pancreatic ductal dilatation or surrounding inflammatory changes. Spleen: Normal in size without focal abnormality. Adrenals/Urinary Tract: Adrenal glands are unremarkable. Kidneys are normal, without renal calculi, focal lesion, or hydronephrosis. Simple renal cyst left kidney. Bladder is unremarkable. Stomach/Bowel: Increased pericecal inflammatory changes including increased size of the loculated fluid and gas collection in the right lower quadrant now measuring approximally 7.5 x 7.0 cm in the axial plane, previously 6.1 x 4.8 cm. Increased gaseous component of this collection. Increased adjacent inflammatory stranding. Increased wall thickening and mucosal hyperenhancement of the adjacent sigmoid colon and cecum. Communication with the sigmoid colon is not excluded. Stomach and small bowel are within normal limits. Vascular/Lymphatic: No significant vascular findings are present. No enlarged abdominal or pelvic lymph nodes. Reproductive: Prostate is unremarkable. Other: Small bilateral fat containing inguinal hernias. Musculoskeletal: Subcutaneous gas in the anterior abdominal likely related to injections. IMPRESSION: 1. Increased inflammatory changes in the pericecal region compatible with perforated appendicitis including increased size of the air and gas collection/abscess. 2. The inflammatory process abuts the sigmoid colon which also demonstrates marked inflammatory changes. Communication with the sigmoid colon is not excluded. Electronically Signed   By: Minerva Fester M.D.   On: 09/07/2021 14:10  Labs:  CBC: Recent Labs    09/07/21 0300 09/08/21 0242 09/09/21 0411 09/10/21 0639  WBC  15.5* 21.4* 14.5* 12.3*  HGB 13.0 12.6* 12.8* 12.4*  HCT 36.8* 36.5* 36.7* 36.5*  PLT 342 352 377 404*    COAGS: Recent Labs    09/08/21 0242  INR 1.4*    BMP: Recent Labs    09/07/21 0300 09/08/21 0242 09/09/21 0411 09/10/21 0639  NA 135 130* 136 136  K 3.5 3.6 3.8 3.7  CL 99 99 99 105  CO2 26 22 25 24   GLUCOSE 145* 157* 156* 118*  BUN 8 6 6 7   CALCIUM 8.2* 7.9* 8.3* 8.1*  CREATININE 1.20 1.13 1.07 0.99  GFRNONAA >60 >60 >60 >60    LIVER FUNCTION TESTS: Recent Labs    09/04/21 1242  BILITOT 0.6  AST 22  ALT 24  ALKPHOS 64  PROT 8.4*  ALBUMIN 4.2    Assessment and Plan:  Perforated appendicitis with abscess formation s/p RLQ drain placement by Dr. 09/08/21  Patient is afebrile with decreasing leukocytosis. He denies pain/discomfort.   Drain Location: RLQ Size: Fr size: 12 Fr Date of placement: 09/08/21 Currently to: Drain collection device: suction bulb 24 hour output:  Output by Drain (mL) 09/08/21 0700 - 09/08/21 1459 09/08/21 1500 - 09/08/21 2259 09/08/21 2300 - 09/09/21 0659 09/09/21 0700 - 09/09/21 1459 09/09/21 1500 - 09/09/21 2259 09/09/21 2300 - 09/10/21 0659 09/10/21 0700 - 09/10/21 1240  Closed System Drain 1 Lateral RLQ Bulb (JP) 12 Fr.  175 30  30      Interval imaging/drain manipulation:  None  Current examination: Flushes/aspirates easily.  Insertion site unremarkable. Suture and stat lock in place. Dressed appropriately.   Plan: Continue TID flushes with 5 cc NS. Record output Q shift. Dressing changes QD or PRN if soiled.  Call IR APP or on call IR MD if difficulty flushing or sudden change in drain output.  Repeat imaging/possible drain injection once output < 10 mL/QD (excluding flush material). Consideration for drain removal if output is < 10 mL/QD (excluding flush material), pending discussion with the providing surgical service.  Discharge planning:  Outpatient follow up has been ordered. He will need to follow up  in 10-14 days for drain evaluation with possible repeat imaging, possible drain injection. A scheduler from our clinic will call him with a date/time of his appointment. An order has been placed for NS flushes.   The patient and his daughter know to flush the drain once daily with 5 ml NS, change the dressing daily or as needed, document the daily output and to keep the site clean and dry.   IR will continue to follow - please call with questions or concerns.  Electronically Signed: 09/12/21, AGACNP-BC [phone] 09/10/2021, 12:10 PM   I spent a total of 15 Minutes at the the patient's bedside AND on the patient's hospital floor or unit, greater than 50% of which was counseling/coordinating care for abscess drain.

## 2021-09-11 MED ORDER — SODIUM CHLORIDE 0.9% FLUSH
5.0000 mL | Freq: Three times a day (TID) | INTRAVENOUS | Status: DC
  Administered 2021-09-11 – 2021-09-12 (×3): 5 mL

## 2021-09-11 NOTE — Progress Notes (Signed)
Subjective: CC: Doing well. No pain at rest. Some pain around drain with movement. Tolerating fld without n/v. Passing flatus. Less diarrhea, ~5x/24 hours. Voiding. Mobilizing in the room.   Objective: Vital signs in last 24 hours: Temp:  [98.2 F (36.8 C)-100.1 F (37.8 C)] 98.2 F (36.8 C) (07/28 2352) Pulse Rate:  [94-97] 94 (07/28 2027) Resp:  [16-20] 16 (07/28 2352) BP: (121-144)/(73-98) 123/90 (07/28 2352) SpO2:  [94 %-96 %] 94 % (07/28 2352) Last BM Date : 09/10/21  Intake/Output from previous day: 07/28 0701 - 07/29 0700 In: 2540.9 [P.O.:840; I.V.:1695.9] Out: 45 [Drains:45] Intake/Output this shift: No intake/output data recorded.  PE: Gen:  Alert, NAD, pleasant Card:  Reg Pulm:  CTAB, no W/R/R, effort normal Abd: Soft, ND, NT, +BS, drain with brown/yellow drainage today that is more transparent.  Psych: A&Ox3   Lab Results:  Recent Labs    09/09/21 0411 09/10/21 0639  WBC 14.5* 12.3*  HGB 12.8* 12.4*  HCT 36.7* 36.5*  PLT 377 404*   BMET Recent Labs    09/09/21 0411 09/10/21 0639  NA 136 136  K 3.8 3.7  CL 99 105  CO2 25 24  GLUCOSE 156* 118*  BUN 6 7  CREATININE 1.07 0.99  CALCIUM 8.3* 8.1*   PT/INR No results for input(s): "LABPROT", "INR" in the last 72 hours. CMP     Component Value Date/Time   NA 136 09/10/2021 0639   NA 139 03/19/2018 1440   K 3.7 09/10/2021 0639   CL 105 09/10/2021 0639   CO2 24 09/10/2021 0639   GLUCOSE 118 (H) 09/10/2021 0639   BUN 7 09/10/2021 0639   BUN 13 03/19/2018 1440   CREATININE 0.99 09/10/2021 0639   CREATININE 1.01 02/17/2016 1017   CALCIUM 8.1 (L) 09/10/2021 0639   PROT 8.4 (H) 09/04/2021 1242   PROT 7.3 03/19/2018 1440   ALBUMIN 4.2 09/04/2021 1242   ALBUMIN 4.3 03/19/2018 1440   AST 22 09/04/2021 1242   ALT 24 09/04/2021 1242   ALKPHOS 64 09/04/2021 1242   BILITOT 0.6 09/04/2021 1242   BILITOT 0.4 03/19/2018 1440   GFRNONAA >60 09/10/2021 0639   GFRAA 97 03/19/2018 1440    Lipase  No results found for: "LIPASE"  Studies/Results: No results found.  Anti-infectives: Anti-infectives (From admission, onward)    Start     Dose/Rate Route Frequency Ordered Stop   09/05/21 1000  valACYclovir (VALTREX) tablet 500 mg        500 mg Oral Daily 09/04/21 2321     09/05/21 0015  piperacillin-tazobactam (ZOSYN) IVPB 3.375 g        3.375 g 12.5 mL/hr over 240 Minutes Intravenous Every 8 hours 09/04/21 2321 09/11/21 2159   09/04/21 1730  piperacillin-tazobactam (ZOSYN) IVPB 3.375 g        3.375 g 100 mL/hr over 30 Minutes Intravenous  Once 09/04/21 1719 09/04/21 1907        Assessment/Plan Acute perforated appendicitis with abscess - S/p IR drain 7/26. Cx's with Klebsiella and Pseudomonas. Reports for abx sensitives pending. When results, plan to narrow and switch to po abx. Cont IV abx for now.  - AM labs written for. WBC improving yesterday. Afebrile overnight without tachycardia or hypotension.  - Mobilize/Pulm toilet - Hopefully will be able to improve with conservative management and avoid surgery. Clinically improving with less pain, minimal ttp, downtrending wbc and he is tolerating a diet + having bowel function. Adv to soft diet today.  If does well with this, possible d/c tomorrow vs Monday.    FEN - Soft diet VTE - SCDs, Lovenox ID - Zosyn 7/22 >>  Tmax to 100.1 overnight. Fever curve improved. Tachycardia resolved.    Hyponatremia - resolved  AKI - resolved Hypokalemia - resolved HTN - Home amlodipine. PRN hydralazine. Lisinopril was held in setting of elevated Cr. BP okay this am - continue to hold today. Plan to restart tomorrow vs at d/c.    LOS: 7 days    Ryan Herman , [facility] 09/11/2021, 8:34 AM Please see Amion for pager number during day hours 7:00am-4:30pm

## 2021-09-11 NOTE — Plan of Care (Signed)

## 2021-09-12 LAB — BASIC METABOLIC PANEL
Anion gap: 10 (ref 5–15)
BUN: 8 mg/dL (ref 6–20)
CO2: 22 mmol/L (ref 22–32)
Calcium: 8.1 mg/dL — ABNORMAL LOW (ref 8.9–10.3)
Chloride: 103 mmol/L (ref 98–111)
Creatinine, Ser: 0.83 mg/dL (ref 0.61–1.24)
GFR, Estimated: >60 mL/min (ref >60)
Glucose, Bld: 96 mg/dL (ref 70–99)
Potassium: 3.4 mmol/L — ABNORMAL LOW (ref 3.5–5.1)
Sodium: 135 mmol/L (ref 135–145)

## 2021-09-12 LAB — AEROBIC/ANAEROBIC CULTURE W GRAM STAIN (SURGICAL/DEEP WOUND)

## 2021-09-12 LAB — CBC
HCT: 35.5 % — ABNORMAL LOW (ref 39.0–52.0)
Hemoglobin: 12.2 g/dL — ABNORMAL LOW (ref 13.0–17.0)
MCH: 30.8 pg (ref 26.0–34.0)
MCHC: 34.4 g/dL (ref 30.0–36.0)
MCV: 89.6 fL (ref 80.0–100.0)
Platelets: 490 10*3/uL — ABNORMAL HIGH (ref 150–400)
RBC: 3.96 MIL/uL — ABNORMAL LOW (ref 4.22–5.81)
RDW: 12.5 % (ref 11.5–15.5)
WBC: 8.4 10*3/uL (ref 4.0–10.5)
nRBC: 0 % (ref 0.0–0.2)

## 2021-09-12 MED ORDER — SODIUM CHLORIDE FLUSH 0.9 % IV SOLN
10.0000 mL | INTRAVENOUS | 0 refills | Status: DC | PRN
  Filled 2021-09-13: qty 300, 30d supply, fill #0

## 2021-09-12 MED ORDER — OXYCODONE HCL 5 MG PO TABS: 5.0000 mg | ORAL_TABLET | Freq: Four times a day (QID) | ORAL | 0 refills | Status: DC | PRN

## 2021-09-12 MED ORDER — CIPROFLOXACIN HCL 500 MG PO TABS
500.0000 mg | ORAL_TABLET | Freq: Two times a day (BID) | ORAL | 0 refills | Status: DC
  Filled 2021-09-12: qty 14, 7d supply, fill #0

## 2021-09-12 MED ORDER — CIPROFLOXACIN HCL 500 MG PO TABS
500.0000 mg | ORAL_TABLET | Freq: Two times a day (BID) | ORAL | Status: DC
  Administered 2021-09-12: 500 mg via ORAL
  Filled 2021-09-12: qty 1

## 2021-09-12 MED ORDER — OXYCODONE HCL 5 MG PO TABS
5.0000 mg | ORAL_TABLET | Freq: Four times a day (QID) | ORAL | 0 refills | Status: DC | PRN
  Filled 2021-09-12: qty 15, 4d supply, fill #0

## 2021-09-12 MED ORDER — METRONIDAZOLE 500 MG PO TABS
500.0000 mg | ORAL_TABLET | Freq: Three times a day (TID) | ORAL | Status: DC
  Administered 2021-09-12 (×2): 500 mg via ORAL
  Filled 2021-09-12 (×2): qty 1

## 2021-09-12 MED ORDER — METRONIDAZOLE 500 MG PO TABS
500.0000 mg | ORAL_TABLET | Freq: Three times a day (TID) | ORAL | 0 refills | Status: DC
  Filled 2021-09-12: qty 21, 7d supply, fill #0

## 2021-09-12 MED ORDER — ACETAMINOPHEN 500 MG PO TABS: 1000.0000 mg | ORAL_TABLET | Freq: Three times a day (TID) | ORAL | 0 refills | Status: AC | PRN

## 2021-09-12 NOTE — Discharge Instructions (Signed)
Please take antibiotics to completion.  As we discussed ciprofloxacin can increase the risk of tendon injury.  Please avoid high-impact activities (running, jumping etc).  Do not drink while taking Flagyl. Do not take your medicine if develop an itchy rash, swelling in your mouth or lips, or difficulty breathing and call to be seen.   Please call the office with any questions or concerns.  Interventional radiology is arranging follow-up for your drain.  Please continue flushes at home as shown by your nurse before discharge.  We will arrange you follow-up in our office.  If you develop any fever, vomiting, new/worsening abdominal pain please call [facility].

## 2021-09-12 NOTE — Discharge Summary (Signed)
Patient ID: Ryan Herman [id number] Apr 04, 1980 41 y.o.  Admit date: 09/04/2021 Discharge date: 09/12/2021  Admitting Diagnosis: Acute perforated appendicitis with phlegmon/abscess  Discharge Diagnosis Acute perforated appendicitis with abscess  Consultants IR  HPI Pt [facility] with 5 days of abdominal pain that was worsening.  He has still been able to eat, but says "food tastes a bit weird," and his appetite is down.  He denies n/v.  The pain came on gradually, but is constant.  It does wax a wane a little, but isn't going away.  He has never had pain like this before.  If he is very still and lies on his left side, it doesn't hurt very much.  Bending over makes it worse.  He had a few nights of chills, but no overt fevers at home.  He denies constipation.  He has had soft stools, but no diarrhea.  He denies dysuria. His main complaint right now is being hungry and thirsty.    Procedures Dr. Juliette Alcide - 09/08/21 - CT drain placement into RLQ abscess, 12 Fr, placed to bulb. Sample sent to lab.    Hospital Course:  Patient presented as above for acute perforated appendicitis with phlegmon/abscess. He was admitted to [facility] service and started on IV abx. IR consulted and did not feel there was anything drainable on admission. Noted to have AKI. Home Lisinopril held, nsaids stopped and Cr improved after IVF. Repeat CT 7/25 w/ perforated appendicitis with increased size of intra-abdominal fluid collection. IR re-consulted and performed CT drain placement into RLQ abscess on 7/26. Patient diet was advanced and tolerated. Cx's came back with Klebsiella and Pseudomonas. Discussed with pharm who recommended PO Cipro/Flagyl. Discussed with patient risk of tendon injury/rupture when taking Ciprofloxacin and to avoid high impact activities etc. On 7/30 the patient was HDS, pain resolved, non-tender to palpation, tolerating diet, having bowel function, mobilizing well in the  room, voiding and felt safe for discharge home. He is to be taught be RN how to flush IR drain. IR has already written for flushes at d/c and is arranging follow up. Patient will follow up with [place] in the office as noted below. Discussed discharge instructions, restrictions and return/call back precautions.  Allergies as of 09/12/2021   No Known Allergies      Medication List     TAKE these medications    acetaminophen 500 MG tablet Commonly known as: TYLENOL Take 2 tablets (1,000 mg total) by mouth every 8 (eight) hours as needed for mild pain.   amLODipine 10 MG tablet Commonly known as: NORVASC TAKE 1 TABLET BY MOUTH EVERY DAY   ciprofloxacin 500 MG tablet Commonly known as: CIPRO Take 1 tablet (500 mg total) by mouth 2 (two) times daily.   Claravis 40 MG capsule Generic drug: ISOtretinoin Take 40 mg by mouth 2 (two) times daily.   hydrochlorothiazide 25 MG tablet Commonly known as: HYDRODIURIL Take 25 mg by mouth daily.   metoprolol succinate 25 MG 24 hr tablet Commonly known as: TOPROL-XL Take 25 mg by mouth daily.   metroNIDAZOLE 500 MG tablet Commonly known as: FLAGYL Take 1 tablet (500 mg total) by mouth 3 (three) times daily.   multivitamin with minerals tablet Take 1 tablet by mouth daily.   oxyCODONE 5 MG immediate release tablet Commonly known as: Roxicodone Take 1 tablet (5 mg total) by mouth every 6 (six) hours as needed for breakthrough pain.   quinapril 10 MG tablet Commonly  known as: ACCUPRIL Take 10 mg by mouth daily.   sodium chloride flush 0.9 % Soln injection Use 74ml by intracatheter route as needed.  Flush abscess drain with 5 ml daily.   valACYclovir 500 MG tablet Commonly known as: VALTREX TAKE 1 TABLET BY MOUTH EVERY DAY          Follow-up Information     [facility] Follow up.   Why: Follow up in 10-14 days for drain evaluation. A scheduler from our clinic will call you with a date/time of your  appointment. Please call our office with any questions/concerns prior to your visit. Contact information: [address] [place] [address] [phone]         Kinsinger, De Blanch, MD. Call in 1 day(s).   Specialty: General Surgery Why: Please call to confirm your appointment date/time, Bring a copy of your photo ID & insurance card, Arrive 30 minutes prior to your appt for paperwork Contact information: [address] [place] [address] [phone]         Jac Canavan, PA-C. Schedule an appointment as soon as possible for a visit.   Specialty: Family Medicine Why: For follow up. Please confirm which blood pressure medications your primary care would like you on at discharge. Contact information: [address] [place] [address] [phone]                 Signed: Leary Roca, [facility] 09/12/2021, 12:03 PM Please see Amion for pager number during day hours 7:00am-4:30pm

## 2021-09-12 NOTE — Progress Notes (Signed)
Subjective: CC: Doing well. Reports abdominal pain has resolved this am. Tolerating soft diet (basked chicken for dinner) without n/v. Passing flatus. Diarrhea resolved. More formed bm yesterday. Voiding. Mobilizing in the room.   Objective: Vital signs in last 24 hours: Temp:  [98 F (36.7 C)-98.7 F (37.1 C)] 98 F (36.7 C) (07/30 0450) Pulse Rate:  [87-97] 87 (07/29 2350) Resp:  [16] 16 (07/30 0450) BP: (113-120)/(62-87) 113/76 (07/30 0450) SpO2:  [93 %-97 %] 97 % (07/30 0450) Last BM Date : 09/10/21  Intake/Output from previous day: 07/29 0701 - 07/30 0700 In: 2071.6 [I.V.:2071.6] Out: -  Intake/Output this shift: No intake/output data recorded.  PE: Gen:  Alert, NAD, pleasant Card:  Reg Pulm:  CTAB, no W/R/R, effort normal Abd: Soft, ND, NT, +BS, drain with milky brown output in bulb (no I/O on this/24 hours) - will discuss with MD change in output characteristics before d/c.  Psych: A&Ox3   Lab Results:  Recent Labs    09/10/21 0639 09/12/21 0400  WBC 12.3* 8.4  HGB 12.4* 12.2*  HCT 36.5* 35.5*  PLT 404* 490*   BMET Recent Labs    09/10/21 0639 09/12/21 0400  NA 136 135  K 3.7 3.4*  CL 105 103  CO2 24 22  GLUCOSE 118* 96  BUN 7 8  CREATININE 0.99 0.83  CALCIUM 8.1* 8.1*   PT/INR No results for input(s): "LABPROT", "INR" in the last 72 hours. CMP     Component Value Date/Time   NA 135 09/12/2021 0400   NA 139 03/19/2018 1440   K 3.4 (L) 09/12/2021 0400   CL 103 09/12/2021 0400   CO2 22 09/12/2021 0400   GLUCOSE 96 09/12/2021 0400   BUN 8 09/12/2021 0400   BUN 13 03/19/2018 1440   CREATININE 0.83 09/12/2021 0400   CREATININE 1.01 02/17/2016 1017   CALCIUM 8.1 (L) 09/12/2021 0400   PROT 8.4 (H) 09/04/2021 1242   PROT 7.3 03/19/2018 1440   ALBUMIN 4.2 09/04/2021 1242   ALBUMIN 4.3 03/19/2018 1440   AST 22 09/04/2021 1242   ALT 24 09/04/2021 1242   ALKPHOS 64 09/04/2021 1242   BILITOT 0.6 09/04/2021 1242   BILITOT 0.4 03/19/2018  1440   GFRNONAA >60 09/12/2021 0400   GFRAA 97 03/19/2018 1440   Lipase  No results found for: "LIPASE"  Studies/Results: No results found.  Anti-infectives: Anti-infectives (From admission, onward)    Start     Dose/Rate Route Frequency Ordered Stop   09/05/21 1000  valACYclovir (VALTREX) tablet 500 mg        500 mg Oral Daily 09/04/21 2321     09/05/21 0015  piperacillin-tazobactam (ZOSYN) IVPB 3.375 g        3.375 g 12.5 mL/hr over 240 Minutes Intravenous Every 8 hours 09/04/21 2321 09/11/21 1834   09/04/21 1730  piperacillin-tazobactam (ZOSYN) IVPB 3.375 g        3.375 g 100 mL/hr over 30 Minutes Intravenous  Once 09/04/21 1719 09/04/21 1907        Assessment/Plan Acute perforated appendicitis with abscess - S/p IR drain 7/26. Cx's with Klebsiella and Pseudomonas. Discuss with pharm best oral option for abx and plan to switch to this today.   - Mobilize/Pulm toilet - Appears he is improving with conservative management. Pain resolved, no ttp, wbc wnl and he is tolerating a diet + having bowel function. Will switch to po abx. I will reach out to IR to see if they  can arrange follow up. Will ask RN to teach drain flushes. Possible d/c today.    FEN - Soft diet VTE - SCDs, Lovenox ID - Zosyn 7/22 - 7/30. Discussing with pharm best oral option based on cx's. Afebrile without tachycardia or hypotension. Wbc normalized.    Hyponatremia - resolved  AKI - resolved HTN - Home amlodipine. PRN hydralazine. Lisinopril was held in setting of elevated Cr. BP okay this am - continue to hold today. Plan to restart at d/c or if stays overnight.     LOS: 8 days    Jacinto Halim , [facility] 09/12/2021, 8:22 AM Please see Amion for pager number during day hours 7:00am-4:30pm

## 2021-09-13 ENCOUNTER — Other Ambulatory Visit (HOSPITAL_COMMUNITY): Payer: Self-pay

## 2021-09-13 ENCOUNTER — Other Ambulatory Visit: Payer: Self-pay | Admitting: General Surgery

## 2021-09-13 DIAGNOSIS — K3532 Acute appendicitis with perforation and localized peritonitis, without abscess: Secondary | ICD-10-CM

## 2021-09-14 ENCOUNTER — Telehealth (HOSPITAL_COMMUNITY): Payer: Self-pay | Admitting: Physician Assistant

## 2021-09-14 HISTORY — PX: OTHER SURGICAL HISTORY: SHX169

## 2021-09-14 NOTE — Progress Notes (Signed)
Patient ID: Mont Jagoda, male   DOB: [birth year], 41 y.o.   MRN: [id number]  Received message patient had questions regarding his drain.  Attempted to reach out twice to phone number [phone].  Phone not answered and no voicemail attached.  Additionally, the spouse's phone was attempted and a message was received that the number was not receiving calls at this time.  Patient will need to provide contact information and/or schedule an appointment for drain evaluation.  If immediate concerns, patient should present to ED for evaluation.  Electronically Signed: Sheliah Plane, PA-C 09/14/2021, 5:02 PM

## 2021-09-15 ENCOUNTER — Telehealth: Payer: Self-pay | Admitting: *Deleted

## 2021-09-15 ENCOUNTER — Telehealth (HOSPITAL_COMMUNITY): Payer: Self-pay | Admitting: Student

## 2021-09-15 NOTE — Telephone Encounter (Signed)
Patient called IR today stating the RLQ abscess drain does not have any output. He also states he hasn't flushed the drain for a few days because he can't get it disconnected. He denies fevers, chills, nausea, vomiting, diarrhea or abdominal pain. He is currently scheduled to be seen 09/24/21 at the [facility] outpatient clinic. I contacted our schedulers to see if we can get his appointment date moved up. They will try to get him re-scheduled within the next day or two.   Patient was advised to go to the ED if he develops any of the previously mentioned symptoms.   Alwyn Ren, [place] [phone] 09/15/2021, 10:11 AM

## 2021-09-15 NOTE — Telephone Encounter (Signed)
Patient has called stating no output from the drain and wants sooner appt. I have tried to call patient several times and no ans/no maching. I have also tried his wife and her phone does not accept calls.Pearla Dubonnet

## 2021-09-20 ENCOUNTER — Ambulatory Visit
Admission: RE | Admit: 2021-09-20 | Discharge: 2021-09-20 | Disposition: A | Discharge status: Home / Self Care | Payer: Commercial Managed Care - PPO | Source: Ambulatory Visit | Attending: General Surgery | Admitting: General Surgery

## 2021-09-20 ENCOUNTER — Ambulatory Visit
Admission: RE | Admit: 2021-09-20 | Discharge: 2021-09-20 | Disposition: A | Discharge status: Home / Self Care | Payer: Commercial Managed Care - PPO | Source: Ambulatory Visit | Attending: Student | Admitting: Student

## 2021-09-20 DIAGNOSIS — K3532 Acute appendicitis with perforation and localized peritonitis, without abscess: Secondary | ICD-10-CM

## 2021-09-20 HISTORY — PX: IR RADIOLOGIST EVAL & MGMT: IMG5224

## 2021-09-20 MED ORDER — IOPAMIDOL (ISOVUE-300) INJECTION 61%
100.0000 mL | Freq: Once | INTRAVENOUS | Status: AC | PRN
  Administered 2021-09-20: 100 mL via INTRAVENOUS

## 2021-09-20 NOTE — Progress Notes (Signed)
Patient ID: Ryan Herman, male   DOB: 1980-11-25, 41 y.o.   MRN: [id number]       Chief Complaint:  Abscess drain, outpatient follow  Referring Physician(s): Covington,Jamie R  History of Present Illness: Ryan Herman is a 41 y.o. male who presented to the [facility] with abdominal pain and difficulty eating.  CT confirmed perforated appendicitis with abscess.  He was admitted to the hospital for IV antibiotics.  Subsequent CT-guided abscess drain placed 09/08/2021.  He was discharged on 09/12/2021 with a drain and oral antibiotics.  He returns for outpatient CT and drain evaluation.  CT today confirms resolved right lower quadrant abscess.  Stable drain catheter position.  Improvement in the right lower quadrant inflammation with some residual changes of appendicitis.  No new abscess.  Drain catheter injection under fluoroscopy today is negative for fistula.  Past Medical History:  Diagnosis Date   Anxiety    mild   Farsightedness    wears glasses   Genital herpes    diagnosed 2008   Microscopic hematuria 2013   [facility], Dr. [name], negative workup    Obesity    OSA (obstructive sleep apnea)    Pulmonary nodule seen on imaging study 2013   CT chest 2015, stable, benign, no further eval needed   Seborrheic dermatitis of scalp    [facility]    Past Surgical History:  Procedure Laterality Date   IR RADIOLOGIST EVAL & MGMT  09/20/2021   WISDOM TOOTH EXTRACTION      Allergies: Patient has no known allergies.  Medications: Prior to Admission medications   Medication Sig Start Date End Date Taking? Authorizing Provider  acetaminophen (TYLENOL) 500 MG tablet Take 2 tablets (1,000 mg total) by mouth every 8 (eight) hours as needed for mild pain. 09/12/21   Maczis, Elmer Sow, PA-C  amLODipine (NORVASC) 10 MG tablet TAKE 1 TABLET BY MOUTH EVERY DAY Patient taking differently: Take 10 mg by mouth daily. 01/17/19   Tysinger, [name], PA-C   ciprofloxacin (CIPRO) 500 MG tablet Take 1 tablet (500 mg total) by mouth 2 (two) times daily. 09/12/21   Maczis, Elmer Sow, PA-C  CLARAVIS 40 MG capsule Take 40 mg by mouth 2 (two) times daily. Patient not taking: Reported on 09/05/2021 05/31/21   [provider]  hydrochlorothiazide (HYDRODIURIL) 25 MG tablet Take 25 mg by mouth daily. 08/12/21   [provider]  metoprolol succinate (TOPROL-XL) 25 MG 24 hr tablet Take 25 mg by mouth daily. 06/11/21   [provider]  metroNIDAZOLE (FLAGYL) 500 MG tablet Take 1 tablet (500 mg total) by mouth 3 (three) times daily. 09/12/21   Maczis, Elmer Sow, PA-C  Multiple Vitamins-Minerals (MULTIVITAMIN WITH MINERALS) tablet Take 1 tablet by mouth daily.    [provider]  oxyCODONE (ROXICODONE) 5 MG immediate release tablet Take 1 tablet (5 mg total) by mouth every 6 (six) hours as needed for breakthrough pain. 09/12/21 09/12/22  Kinsinger, De Blanch, MD  sodium chloride flush 0.9 % SOLN injection Use 70ml by intracatheter route as needed.  Flush abscess drain with 5 ml daily. 09/12/21   Kinsinger, De Blanch, MD  valACYclovir (VALTREX) 500 MG tablet TAKE 1 TABLET BY MOUTH EVERY DAY Patient taking differently: Take 500 mg by mouth daily. 12/21/18   Tysinger, [name], PA-C     Family History  Problem Relation Age of Onset   Hypertension Mother    Diabetes Father        complications  Kidney disease Father        dialysis   Hypertension Brother    Cancer Maternal Uncle        prostate   Hypertension Brother    Stroke Neg Hx    Heart disease Neg Hx     Social History   Socioeconomic History   Marital status: Single    Spouse name: Not on file   Number of children: Not on file   Years of education: Not on file   Highest education level: Not on file  Occupational History   Occupation: drives bus for [employer]    Employer: [employer]  Tobacco Use   Smoking status: Former    Years: 10.00    Types:  Cigarettes    Quit date: 07/31/2008    Years since quitting: 13.1   Smokeless tobacco: Never   Tobacco comments:    prior black and milds, but not daily  Vaping Use   Vaping Use: Never used  Substance and Sexual Activity   Alcohol use: Yes    Alcohol/week: 1.0 standard drink of alcohol    Types: 1 Shots of liquor per week    Comment: used to drink heavy partying in younger years, but not daily. Occasional drink now   Drug use: No   Sexual activity: Not on file  Other Topics Concern   Not on file  Social History Narrative   Separated, has 1 daughter 50 yo, has a girlfriend.   Exercise - some with walking, some calisthenics.  Drives bus for [employer].   As of 02/2018   Social Determinants of Health   Financial Resource Strain: Not on file  Food Insecurity: Not on file  Transportation Needs: Not on file  Physical Activity: Not on file  Stress: Not on file  Social Connections: Not on file     Review of Systems: A 12 point ROS discussed and pertinent positives are indicated in the HPI above.  All other systems are negative.  Review of Systems  Vital Signs: BP 131/85   Pulse 96   SpO2 98%     Physical Exam Constitutional:      General: He is not in acute distress.    Appearance: He is not diaphoretic.  Eyes:     General: No scleral icterus.    Conjunctiva/sclera: Conjunctivae normal.  Abdominal:     General: There is no distension.     Palpations: Abdomen is soft.     Tenderness: There is no abdominal tenderness.     Comments: Right lower quadrant catheter site is clean, dry and intact.  Skin:    Coloration: Skin is not jaundiced.  Neurological:     General: No focal deficit present.     Mental Status: He is alert. Mental status is at baseline.  Psychiatric:        Mood and Affect: Mood normal.        Thought Content: Thought content normal.      Imaging: DG Sinus/Fist Tube Chk-Non GI  Result Date: 09/20/2021 INDICATION: Perforated appendicitis a  right lower quadrant abscess status post percutaneous drain EXAM: FLUOROSCOPIC INJECTION OF THE RIGHT LOWER QUADRANT ABSCESS DRAIN MEDICATIONS: PATIENT HAS COMPLETED ORAL ANTIBIOTICS AS AN OUTPATIENT ANESTHESIA/SEDATION: None. COMPLICATIONS: None immediate. PROCEDURE: Existing right lower quadrant abscess drain was injected with contrast. Fluoroscopic imaging performed. Stable drain catheter position. Small collapsed abscess cavity at the drain catheter retention loop. Negative for fistula to adjacent bowel or the appendix. Abscess cavity  decompressed by syringe aspiration. IMPRESSION: Negative for fistula to adjacent bowel. Electronically Signed   By: Judie Petit.  Kaitelyn Jamison M.D.   On: 09/20/2021 13:23   IR Radiologist Eval & Mgmt  Result Date: 09/20/2021 Please refer to notes tab for details about interventional procedure. (Op Note)  CT ABDOMEN PELVIS W CONTRAST  Result Date: 09/20/2021 CLINICAL DATA:  Perforated appendicitis with abscess, status post percutaneous drain 09/08/2021. EXAM: CT ABDOMEN AND PELVIS WITH CONTRAST TECHNIQUE: Multidetector CT imaging of the abdomen and pelvis was performed using the standard protocol following bolus administration of intravenous contrast. RADIATION DOSE REDUCTION: This exam was performed according to the departmental dose-optimization program which includes automated exposure control, adjustment of the mA and/or kV according to patient size and/or use of iterative reconstruction technique. CONTRAST:  ISOVUE-300 IOPAMIDOL (ISOVUE-300) INJECTION 61% COMPARISON:  09/07/2021 FINDINGS: Lower chest: Improving left basilar atelectasis. Normal heart size. No pericardial or pleural effusion. Hepatobiliary: No focal liver abnormality is seen. No gallstones, gallbladder wall thickening, or biliary dilatation. Pancreas: Unremarkable. No pancreatic ductal dilatation or surrounding inflammatory changes. Spleen: Normal in size without focal abnormality. Adrenals/Urinary Tract: normal  adrenal glands. Stable medial left renal hypodense cyst measures 19 mm. No imaging follow-up recommended. No acute renal abnormality, obstruction, hydronephrosis, or associated hydroureter. No ureteral calculus UVJ abnormality. Bladder collapsed. Stomach/Bowel: Negative for bowel obstruction, significant dilatation, ileus, or free air. There are a few scattered right colon diverticula noted. Right lower quadrant abscess drain in stable position. Abscess/fluid collection has resolved. Improvement in the right lower quadrant inflammatory changes. Appendix remains prominent with mucosal enhancement compatible with appendicitis. No new abdominopelvic fluid collections. Vascular/Lymphatic: No significant vascular findings are present. No enlarged abdominal or pelvic lymph nodes. Reproductive: No significant finding by CT Other: No abdominal wall hernia or abnormality. No abdominopelvic ascites. Musculoskeletal: No acute or significant osseous findings. IMPRESSION: Resolved right lower quadrant abscess. Stable drain catheter position. Improvement in the right lower quadrant inflammatory process with residual appendicitis noted. No new abdominopelvic abscess. Electronically Signed   By: Judie Petit.  Shacora Zynda M.D.   On: 09/20/2021 12:53   CT IMAGE GUIDED DRAINAGE BY PERCUTANEOUS CATHETER  Result Date: 09/08/2021 INDICATION: Right lower quadrant abscess, ruptured appendix EXAM: CT GUIDED DRAINAGE OF right lower quadrant abdominal ABSCESS TECHNIQUE: Multidetector CT imaging of the abdomen was performed following the standard protocol without IV contrast. RADIATION DOSE REDUCTION: This exam was performed according to the departmental dose-optimization program which includes automated exposure control, adjustment of the mA and/or kV according to patient size and/or use of iterative reconstruction technique. MEDICATIONS: The patient is currently admitted to the hospital and receiving intravenous antibiotics. The antibiotics were  administered within an appropriate time frame prior to the initiation of the procedure. ANESTHESIA/SEDATION: Moderate (conscious) sedation was employed during this procedure. A total of Versed 3 mg and Fentanyl 100 mcg was administered intravenously by the radiology nurse. Total intra-service moderate Sedation Time: 30 minutes. The patient's level of consciousness and vital signs were monitored continuously by radiology nursing throughout the procedure under my direct supervision. COMPLICATIONS: None immediate. TECHNIQUE: Informed written consent was obtained from the patient after a thorough discussion of the procedural risks, benefits and alternatives. All questions were addressed. Maximal Sterile Barrier Technique was utilized including caps, mask, sterile gowns, sterile gloves, sterile drape, hand hygiene and skin antiseptic. A timeout was performed prior to the initiation of the procedure. PROCEDURE: The patient was placed supine on the exam table. Limited CT of the abdomen and pelvis was performed for planning purposes.  This demonstrated abscess in the right lower quadrant. Skin entry site was marked, and the overlying skin was prepped and draped in the standard sterile fashion. Local analgesia was obtained with 1% lidocaine. Using intermittent CT fluoroscopy, a 19 gauge Yueh needle was advanced towards the right lower quadrant abscess. Location was confirmed with CT and return of purulent material. An 035 Amplatz wire was then advanced into the collection, followed by serial tract dilation and placement of a 12 French multipurpose locking drainage catheter within the right lower quadrant abscess. Location was again confirmed with CT and return of additional purulent material. A sample was sent to the lab for analysis. The drainage catheter was placed to bulb suction. It was attached to the skin using silk suture and a dressing. The patient tolerated the procedure well, and was transferred to recovery in  stable condition. FINDINGS: See above. IMPRESSION: Successful CT-guided placement of a 12 French locking drainage catheter into the right lower quadrant intra-abdominal abscess. Drainage catheter placed to bulb suction. A sample was sent to the lab for microbiology analysis. Electronically Signed   By: Olive Bass M.D.   On: 09/08/2021 14:00   CT ABDOMEN PELVIS W CONTRAST  Result Date: 09/07/2021 CLINICAL DATA:  Acute abdominal pain EXAM: CT ABDOMEN AND PELVIS WITH CONTRAST TECHNIQUE: Multidetector CT imaging of the abdomen and pelvis was performed using the standard protocol following bolus administration of intravenous contrast. RADIATION DOSE REDUCTION: This exam was performed according to the departmental dose-optimization program which includes automated exposure control, adjustment of the mA and/or kV according to patient size and/or use of iterative reconstruction technique. CONTRAST:  OMNIPAQUE IOHEXOL 300 MG/ML  SOLN COMPARISON:  CT 09/04/2021. FINDINGS: Lower chest: No acute abnormality. Hepatobiliary: No focal liver abnormality is seen. No gallstones, gallbladder wall thickening, or biliary dilatation. Pancreas: Unremarkable. No pancreatic ductal dilatation or surrounding inflammatory changes. Spleen: Normal in size without focal abnormality. Adrenals/Urinary Tract: Adrenal glands are unremarkable. Kidneys are normal, without renal calculi, focal lesion, or hydronephrosis. Simple renal cyst left kidney. Bladder is unremarkable. Stomach/Bowel: Increased pericecal inflammatory changes including increased size of the loculated fluid and gas collection in the right lower quadrant now measuring approximally 7.5 x 7.0 cm in the axial plane, previously 6.1 x 4.8 cm. Increased gaseous component of this collection. Increased adjacent inflammatory stranding. Increased wall thickening and mucosal hyperenhancement of the adjacent sigmoid colon and cecum. Communication with the sigmoid colon is not  excluded. Stomach and small bowel are within normal limits. Vascular/Lymphatic: No significant vascular findings are present. No enlarged abdominal or pelvic lymph nodes. Reproductive: Prostate is unremarkable. Other: Small bilateral fat containing inguinal hernias. Musculoskeletal: Subcutaneous gas in the anterior abdominal likely related to injections. IMPRESSION: 1. Increased inflammatory changes in the pericecal region compatible with perforated appendicitis including increased size of the air and gas collection/abscess. 2. The inflammatory process abuts the sigmoid colon which also demonstrates marked inflammatory changes. Communication with the sigmoid colon is not excluded. Electronically Signed   By: Minerva Fester M.D.   On: 09/07/2021 14:10   CT ABDOMEN PELVIS W CONTRAST  Result Date: 09/04/2021 CLINICAL DATA:  Abdominal pain for 1 week EXAM: CT ABDOMEN AND PELVIS WITH CONTRAST TECHNIQUE: Multidetector CT imaging of the abdomen and pelvis was performed using the standard protocol following bolus administration of intravenous contrast. RADIATION DOSE REDUCTION: This exam was performed according to the departmental dose-optimization program which includes automated exposure control, adjustment of the mA and/or kV according to patient size and/or use of  iterative reconstruction technique. CONTRAST:  100mL OMNIPAQUE IOHEXOL 300 MG/ML  SOLN COMPARISON:  CT examination dated February 01, 2012 FINDINGS: Lower chest: No acute abnormality. Hepatobiliary: No focal liver abnormality is seen. No gallstones, gallbladder wall thickening, or biliary dilatation. Pancreas: Unremarkable. No pancreatic ductal dilatation or surrounding inflammatory changes. Spleen: Normal in size without focal abnormality. Adrenals/Urinary Tract: Adrenal glands are unremarkable. Left upper pole renal cortical cysts. Symmetric perfusion of bilateral kidneys. No evidence of nephrolithiasis or hydronephrosis. Bladder is unremarkable.  Stomach/Bowel: Stomach is within normal limits. There are pericecal inflammatory changes with small amount of extraluminal air and adjacent fat stranding, most consistent with perforated appendix, likely a subacute process. Vascular/Lymphatic: No significant vascular findings are present. No enlarged abdominal or pelvic lymph nodes. Reproductive: Prostate is unremarkable. Other: No abdominal wall hernia or abnormality. No abdominopelvic ascites. 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 IMPRESSION: 1. Pericecal inflammatory changes with small focus of extraluminal free air most consistent with perforated appendix with surrounding inflammatory changes/developing abscess/fluid collection. Surgical consultation for further management is recommended. 2. Bowel loops are normal in caliber without evidence of obstruction. 3. No evidence of nephrolithiasis or hydronephrosis. Simple cyst in the upper pole of the left kidney, which does not require follow-up. Above findings and recommendations were reported to PA Sophia at approximately 5:05 p.m. on 09/04/2021. Electronically Signed   By: Larose HiresImran  Ahmed D.O.   On: 09/04/2021 17:08   DG Chest 2 View  Result Date: 09/04/2021 CLINICAL DATA:  Nausea, vomiting, abdominal pain EXAM: CHEST - 2 VIEW COMPARISON:  03/23/2009 FINDINGS: Transverse diameter heart is in upper limits of normal. There are no signs of pulmonary edema or focal pulmonary consolidation. There is poor inspiration. There is no pleural effusion or pneumothorax. IMPRESSION: There are no signs of pulmonary edema or focal pulmonary consolidation. Electronically Signed   By: Ernie AvenaPalani  Rathinasamy M.D.   On: 09/04/2021 15:11    Labs:  CBC: Recent Labs    09/08/21 0242 09/09/21 0411 09/10/21 0639 09/12/21 0400  WBC 21.4* 14.5* 12.3* 8.4  HGB 12.6* 12.8* 12.4* 12.2*  HCT 36.5* 36.7* 36.5* 35.5*  PLT 352 377 404* 490*    COAGS: Recent Labs    09/08/21 0242  INR 1.4*     BMP: Recent Labs    09/08/21 0242 09/09/21 0411 09/10/21 0639 09/12/21 0400  NA 130* 136 136 135  K 3.6 3.8 3.7 3.4*  CL 99 99 105 103  CO2 22 25 24 22   GLUCOSE 157* 156* 118* 96  BUN 6 6 7 8   CALCIUM 7.9* 8.3* 8.1* 8.1*  CREATININE 1.13 1.07 0.99 0.83  GFRNONAA >60 >60 >60 >60    LIVER FUNCTION TESTS: Recent Labs    09/04/21 1242  BILITOT 0.6  AST 22  ALT 24  ALKPHOS 64  PROT 8.4*  ALBUMIN 4.2     Assessment and Plan:  Perforated appendicitis with abscess, status post recent percutaneous drain.  Resolved right lower quadrant abscess following percutaneous drain.  Fluoroscopic injection is negative for fistula.  Plan: Discontinue suction bulb and flushing.  Switch to gravity drain bag.  Outpatient follow-up in 2 weeks with possible repeat fluoroscopic injection.  Patient has surgery follow-up at the end of the month as well.   Electronically Signed: Berdine DanceMichael Jaivyn Gulla 09/20/2021, 1:45 PM   I spent a total of    40 Minutes in face to face in clinical consultation, greater than 50% of which was counseling/coordinating care for this patient with an abscess drain  related to perforated appendicitis

## 2021-09-21 ENCOUNTER — Other Ambulatory Visit: Payer: Self-pay | Admitting: General Surgery

## 2021-09-21 DIAGNOSIS — K3532 Acute appendicitis with perforation and localized peritonitis, without abscess: Secondary | ICD-10-CM

## 2021-09-22 ENCOUNTER — Other Ambulatory Visit (HOSPITAL_COMMUNITY): Payer: Self-pay

## 2021-09-24 ENCOUNTER — Other Ambulatory Visit: Payer: BLUE CROSS/BLUE SHIELD

## 2021-10-04 ENCOUNTER — Encounter: Payer: Self-pay | Admitting: Radiology

## 2021-10-04 ENCOUNTER — Ambulatory Visit
Admission: RE | Admit: 2021-10-04 | Discharge: 2021-10-04 | Disposition: A | Discharge status: Home / Self Care | Payer: Commercial Managed Care - PPO | Source: Ambulatory Visit | Attending: General Surgery | Admitting: General Surgery

## 2021-10-04 DIAGNOSIS — K3532 Acute appendicitis with perforation and localized peritonitis, without abscess: Secondary | ICD-10-CM

## 2021-10-04 HISTORY — PX: IR RADIOLOGIST EVAL & MGMT: IMG5224

## 2021-10-04 NOTE — Progress Notes (Signed)
**Note Ryan-Identified via Obfuscation** Referring Physician(s): Ryan Herman   Chief Complaint: The patient is seen in follow up today for in drain clinic for RLQ abscess drain evaluation.  History of present illness:  [name] known to VIR service for perforated appendicitis after he presented to [facility] ER with abdominal pain. Imaging revealed perforated appendicitis with abscess. He was evaluated by Surgery service and recommended for drain placement. This was performed on 09/08/21 and the patient was discharged successfully. He was seen in previous drain follow up on 09/20/21 where a CT AP and drain injection was performed. No residual collection was present but he had residual drainage, therefore was recommended to return in short interval. He has yet to have his surgery clinic follow up, scheduled for 10/13/21. He denies any significant residual drain output. ROS was negative.  Past Medical History:  Diagnosis Date   Anxiety    mild   Farsightedness    wears glasses   Genital herpes    diagnosed 2008   Microscopic hematuria 2013   [facility], Dr. Berneice Herman, negative workup    Obesity    OSA (obstructive sleep apnea)    Pulmonary nodule seen on imaging study 2013   CT chest 2015, stable, benign, no further eval needed   Seborrheic dermatitis of scalp    [facility]    Past Surgical History:  Procedure Laterality Date   IR RADIOLOGIST EVAL & MGMT  09/20/2021   IR RADIOLOGIST EVAL & MGMT  10/04/2021   WISDOM TOOTH EXTRACTION      Allergies: Patient has no known allergies.  Medications: Prior to Admission medications   Medication Sig Start Date End Date Taking? Authorizing Provider  acetaminophen (TYLENOL) 500 MG tablet Take 2 tablets (1,000 mg total) by mouth every 8 (eight) hours as needed for mild pain. 09/12/21   Ryan Herman, Ryan Herman, Herman  amLODipine (NORVASC) 10 MG tablet TAKE 1 TABLET BY MOUTH EVERY DAY Patient taking differently: Take 10 mg by mouth daily. 01/17/19   Ryan Herman,  Ryan Herman  ciprofloxacin (CIPRO) 500 MG tablet Take 1 tablet (500 mg total) by mouth 2 (two) times daily. 09/12/21   Ryan Herman, Ryan Herman, Herman  CLARAVIS 40 MG capsule Take 40 mg by mouth 2 (two) times daily. Patient not taking: Reported on 09/05/2021 05/31/21   Provider, Historical, Herman  hydrochlorothiazide (HYDRODIURIL) 25 MG tablet Take 25 mg by mouth daily. 08/12/21   Provider, Historical, Herman  metoprolol succinate (TOPROL-XL) 25 MG 24 hr tablet Take 25 mg by mouth daily. 06/11/21   Provider, Historical, Herman  metroNIDAZOLE (FLAGYL) 500 MG tablet Take 1 tablet (500 mg total) by mouth 3 (three) times daily. 09/12/21   Ryan Herman, Ryan Herman, Herman  Multiple Vitamins-Minerals (MULTIVITAMIN WITH MINERALS) tablet Take 1 tablet by mouth daily.    Provider, Historical, Herman  oxyCODONE (ROXICODONE) 5 MG immediate release tablet Take 1 tablet (5 mg total) by mouth every 6 (six) hours as needed for breakthrough pain. 09/12/21 09/12/22  Ryan Herman  sodium chloride flush 0.9 % SOLN injection Use 101ml by intracatheter route as needed.  Flush abscess drain with 5 ml daily. 09/12/21   Ryan Herman  valACYclovir (VALTREX) 500 MG tablet TAKE 1 TABLET BY MOUTH EVERY DAY Patient taking differently: Take 500 mg by mouth daily. 12/21/18   Ryan Herman, Ryan Herman     Family History  Problem Relation Age of Onset   Hypertension Mother    Diabetes Father  complications   Kidney disease Father        dialysis   Hypertension Brother    Cancer Maternal Uncle        prostate   Hypertension Brother    Stroke Neg Hx    Heart disease Neg Hx     Social History   Socioeconomic History   Marital status: Single    Spouse name: Not on file   Number of children: Not on file   Years of education: Not on file   Highest education level: Not on file  Occupational History   Occupation: drives bus for [employer]    Employer: [employer]  Tobacco Use   Smoking status: Former    Years: 10.00     Types: Cigarettes    Quit date: 07/31/2008    Years since quitting: 13.1   Smokeless tobacco: Never   Tobacco comments:    prior black and milds, but not daily  Vaping Use   Vaping Use: Never used  Substance and Sexual Activity   Alcohol use: Yes    Alcohol/week: 1.0 standard drink of alcohol    Types: 1 Shots of liquor per week    Comment: used to drink heavy partying in younger years, but not daily. Occasional drink now   Drug use: No   Sexual activity: Not on file  Other Topics Concern   Not on file  Social History Narrative   Separated, has 1 daughter 74 yo, has a girlfriend.   Exercise - some with walking, some calisthenics.  Drives bus for [employer].   As of 02/2018   Social Determinants of Health   Financial Resource Strain: Not on file  Food Insecurity: Not on file  Transportation Needs: Not on file  Physical Activity: Not on file  Stress: Not on file  Social Connections: Not on file     Vital Signs: BP (!) 145/85   Pulse (!) 113   Temp 98.6 F (37 C)   SpO2 99%   Physical Exam  Imaging: DG Sinus/Fist Tube Chk-Non GI  Result Date: 10/04/2021 CLINICAL DATA:  Perforated appendicitis/drain EXAM: ABSCESS INJECTION COMPARISON:  Abscessogram, 09/20/2021. CT AP, 09/20/2021 and 09/07/2021. CONTRAST:  80 mL Omnipaque 300-administered via the existing percutaneous drain. FLUOROSCOPY TIME:  Fluoroscopic dose; 18.5 mGy TECHNIQUE: The patient was positioned supine on the fluoroscopy table. A preprocedural spot fluoroscopic image was obtained of the pelvis and the existing percutaneous drainage catheter. Multiple spot fluoroscopic and radiographic images were obtained following the injection of a small amount of contrast via the existing percutaneous drainage catheter. IMPRESSION: No residual abscess cavity or fistulous communication with bowel. The drain was therefore removed. These results were called by telephone at the time of interpretation, and prior to drain  removal, on 10/04/2021 to provider Ryan Herman , who verbally acknowledged these results. Ryan Banning, [facility] Radiology Electronically Signed   By: Ryan Herman M.D.   On: 10/04/2021 13:52   IR Radiologist Eval & Mgmt  Result Date: 10/04/2021 Please refer to notes tab for details about interventional procedure. (Op Note)   Labs:  CBC: Recent Labs    09/08/21 0242 09/09/21 0411 09/10/21 0639 09/12/21 0400  WBC 21.4* 14.5* 12.3* 8.4  HGB 12.6* 12.8* 12.4* 12.2*  HCT 36.5* 36.7* 36.5* 35.5*  PLT 352 377 404* 490*    COAGS: Recent Labs    09/08/21 0242  INR 1.4*    BMP: Recent Labs  09/08/21 0242 09/09/21 0411 09/10/21 0639 09/12/21 0400  NA 130* 136 136 135  K 3.6 3.8 3.7 3.4*  CL 99 99 105 103  CO2 22 25 24 22   GLUCOSE 157* 156* 118* 96  BUN 6 6 7 8   CALCIUM 7.9* 8.3* 8.1* 8.1*  CREATININE 1.13 1.07 0.99 0.83  GFRNONAA >60 >60 >60 >60    LIVER FUNCTION TESTS: Recent Labs    09/04/21 1242  BILITOT 0.6  AST 22  ALT 24  ALKPHOS 64  PROT 8.4*  ALBUMIN 4.2    Assessment and Plan:  [name] w PMHx of perforated appendicitis s/p drain placement on 09/08/21. Initial drain clinic follow up on 09/20/21 with residual output. CT AP and Abscessogram without residual collection or fistula.  *No significant drainage reported. Drain injection without residual abscess cavity or colonic fistula. *The drain was therefore removed.  *Pts Surgeon, Dr 09/10/21 was made aware of findings and is in agreement with the plan. *Pt to follow up in surgery clinic on 10/13/21 as was previously scheduled.   Electronically Signed:  Sheliah Hatch, [facility] Radiology   Pager. [phone] Clinic. [phone]   I spent a total of 15 Minutes in face to face in clinical consultation, greater than 50% of which was counseling/coordinating care for Mr. Ryan Herman  drain care

## 2021-10-08 ENCOUNTER — Emergency Department (HOSPITAL_BASED_OUTPATIENT_CLINIC_OR_DEPARTMENT_OTHER): Payer: Commercial Managed Care - PPO

## 2021-10-08 ENCOUNTER — Encounter (HOSPITAL_BASED_OUTPATIENT_CLINIC_OR_DEPARTMENT_OTHER): Payer: Self-pay

## 2021-10-08 ENCOUNTER — Other Ambulatory Visit: Payer: Self-pay

## 2021-10-08 ENCOUNTER — Inpatient Hospital Stay (HOSPITAL_BASED_OUTPATIENT_CLINIC_OR_DEPARTMENT_OTHER)
Admission: EM | Admit: 2021-10-08 | Discharge: 2021-10-19 | DRG: 330 | Disposition: A | Discharge status: Home Health | Payer: Commercial Managed Care - PPO | Attending: Surgery | Admitting: Surgery

## 2021-10-08 DIAGNOSIS — K3533 Acute appendicitis with perforation and localized peritonitis, with abscess: Principal | ICD-10-CM | POA: Diagnosis present

## 2021-10-08 DIAGNOSIS — K3532 Acute appendicitis with perforation and localized peritonitis, without abscess: Secondary | ICD-10-CM

## 2021-10-08 DIAGNOSIS — K660 Peritoneal adhesions (postprocedural) (postinfection): Secondary | ICD-10-CM | POA: Diagnosis present

## 2021-10-08 DIAGNOSIS — G4733 Obstructive sleep apnea (adult) (pediatric): Secondary | ICD-10-CM | POA: Diagnosis present

## 2021-10-08 DIAGNOSIS — Z79899 Other long term (current) drug therapy: Secondary | ICD-10-CM | POA: Diagnosis not present

## 2021-10-08 DIAGNOSIS — Z8249 Family history of ischemic heart disease and other diseases of the circulatory system: Secondary | ICD-10-CM | POA: Diagnosis not present

## 2021-10-08 DIAGNOSIS — A6002 Herpesviral infection of other male genital organs: Secondary | ICD-10-CM | POA: Diagnosis present

## 2021-10-08 DIAGNOSIS — Z87891 Personal history of nicotine dependence: Secondary | ICD-10-CM

## 2021-10-08 DIAGNOSIS — I10 Essential (primary) hypertension: Secondary | ICD-10-CM | POA: Diagnosis present

## 2021-10-08 DIAGNOSIS — L210 Seborrhea capitis: Secondary | ICD-10-CM | POA: Diagnosis present

## 2021-10-08 DIAGNOSIS — Q438 Other specified congenital malformations of intestine: Secondary | ICD-10-CM | POA: Diagnosis not present

## 2021-10-08 DIAGNOSIS — E669 Obesity, unspecified: Secondary | ICD-10-CM | POA: Diagnosis present

## 2021-10-08 DIAGNOSIS — D62 Acute posthemorrhagic anemia: Secondary | ICD-10-CM | POA: Diagnosis not present

## 2021-10-08 DIAGNOSIS — R1031 Right lower quadrant pain: Principal | ICD-10-CM

## 2021-10-08 DIAGNOSIS — F419 Anxiety disorder, unspecified: Secondary | ICD-10-CM | POA: Diagnosis present

## 2021-10-08 DIAGNOSIS — K567 Ileus, unspecified: Secondary | ICD-10-CM | POA: Diagnosis not present

## 2021-10-08 DIAGNOSIS — Z6836 Body mass index (BMI) 36.0-36.9, adult: Secondary | ICD-10-CM

## 2021-10-08 LAB — CBC WITH DIFFERENTIAL/PLATELET
Abs Immature Granulocytes: 0.03 10*3/uL (ref 0.00–0.07)
Basophils Absolute: 0 10*3/uL (ref 0.0–0.1)
Basophils Relative: 0 %
Eosinophils Absolute: 0.1 10*3/uL (ref 0.0–0.5)
Eosinophils Relative: 1 %
HCT: 42.5 % (ref 39.0–52.0)
Hemoglobin: 14.6 g/dL (ref 13.0–17.0)
Immature Granulocytes: 0 %
Lymphocytes Relative: 12 %
Lymphs Abs: 1.2 10*3/uL (ref 0.7–4.0)
MCH: 30.2 pg (ref 26.0–34.0)
MCHC: 34.4 g/dL (ref 30.0–36.0)
MCV: 88 fL (ref 80.0–100.0)
Monocytes Absolute: 1.2 10*3/uL — ABNORMAL HIGH (ref 0.1–1.0)
Monocytes Relative: 12 %
Neutro Abs: 7.7 10*3/uL (ref 1.7–7.7)
Neutrophils Relative %: 75 %
Platelets: 251 10*3/uL (ref 150–400)
RBC: 4.83 MIL/uL (ref 4.22–5.81)
RDW: 13.2 % (ref 11.5–15.5)
WBC: 10.2 10*3/uL (ref 4.0–10.5)
nRBC: 0 % (ref 0.0–0.2)

## 2021-10-08 LAB — COMPREHENSIVE METABOLIC PANEL
ALT: 17 U/L (ref 0–44)
AST: 12 U/L — ABNORMAL LOW (ref 15–41)
Albumin: 4.4 g/dL (ref 3.5–5.0)
Alkaline Phosphatase: 59 U/L (ref 38–126)
Anion gap: 11 (ref 5–15)
BUN: 10 mg/dL (ref 6–20)
CO2: 27 mmol/L (ref 22–32)
Calcium: 9.9 mg/dL (ref 8.9–10.3)
Chloride: 99 mmol/L (ref 98–111)
Creatinine, Ser: 0.89 mg/dL (ref 0.61–1.24)
GFR, Estimated: >60 mL/min (ref >60)
Glucose, Bld: 127 mg/dL — ABNORMAL HIGH (ref 70–99)
Potassium: 3.4 mmol/L — ABNORMAL LOW (ref 3.5–5.1)
Sodium: 137 mmol/L (ref 135–145)
Total Bilirubin: 0.8 mg/dL (ref 0.3–1.2)
Total Protein: 8.6 g/dL — ABNORMAL HIGH (ref 6.5–8.1)

## 2021-10-08 LAB — CBC
HCT: 37.1 % — ABNORMAL LOW (ref 39.0–52.0)
Hemoglobin: 13.2 g/dL (ref 13.0–17.0)
MCH: 30.8 pg (ref 26.0–34.0)
MCHC: 35.6 g/dL (ref 30.0–36.0)
MCV: 86.5 fL (ref 80.0–100.0)
Platelets: 206 10*3/uL (ref 150–400)
RBC: 4.29 MIL/uL (ref 4.22–5.81)
RDW: 13.3 % (ref 11.5–15.5)
WBC: 12.9 10*3/uL — ABNORMAL HIGH (ref 4.0–10.5)
nRBC: 0 % (ref 0.0–0.2)

## 2021-10-08 LAB — URINALYSIS, ROUTINE W REFLEX MICROSCOPIC
Bilirubin Urine: NEGATIVE
Glucose, UA: NEGATIVE mg/dL
Ketones, ur: NEGATIVE mg/dL
Leukocytes,Ua: NEGATIVE
Nitrite: NEGATIVE
Specific Gravity, Urine: >1.046 — ABNORMAL HIGH (ref 1.005–1.030)
pH: 6 (ref 5.0–8.0)

## 2021-10-08 LAB — LACTIC ACID, PLASMA
Lactic Acid, Venous: 1.3 mmol/L (ref 0.5–1.9)
Lactic Acid, Venous: 1.3 mmol/L (ref 0.5–1.9)

## 2021-10-08 LAB — CREATININE, SERUM
Creatinine, Ser: 0.89 mg/dL (ref 0.61–1.24)
GFR, Estimated: >60 mL/min (ref >60)

## 2021-10-08 LAB — LIPASE, BLOOD: Lipase: 14 U/L (ref 11–51)

## 2021-10-08 MED ORDER — GABAPENTIN 300 MG PO CAPS
300.0000 mg | ORAL_CAPSULE | Freq: Three times a day (TID) | ORAL | Status: DC
  Administered 2021-10-08 – 2021-10-09 (×4): 300 mg via ORAL
  Filled 2021-10-08 (×4): qty 1

## 2021-10-08 MED ORDER — ONDANSETRON HCL 4 MG/2ML IJ SOLN
4.0000 mg | Freq: Four times a day (QID) | INTRAMUSCULAR | Status: DC | PRN
  Administered 2021-10-12 – 2021-10-15 (×4): 4 mg via INTRAVENOUS
  Filled 2021-10-08 (×4): qty 2

## 2021-10-08 MED ORDER — HYDROMORPHONE HCL 1 MG/ML IJ SOLN: 0.5000 mg | INTRAMUSCULAR | Status: DC | PRN

## 2021-10-08 MED ORDER — IOHEXOL 300 MG/ML  SOLN
100.0000 mL | Freq: Once | INTRAMUSCULAR | Status: AC | PRN
  Administered 2021-10-08: 100 mL via INTRAVENOUS

## 2021-10-08 MED ORDER — OXYCODONE HCL 5 MG PO TABS: 5.0000 mg | ORAL_TABLET | ORAL | Status: DC | PRN

## 2021-10-08 MED ORDER — ACETAMINOPHEN 325 MG PO TABS
650.0000 mg | ORAL_TABLET | Freq: Once | ORAL | Status: AC
  Administered 2021-10-08: 650 mg via ORAL
  Filled 2021-10-08: qty 2

## 2021-10-08 MED ORDER — DOCUSATE SODIUM 100 MG PO CAPS
100.0000 mg | ORAL_CAPSULE | Freq: Two times a day (BID) | ORAL | Status: DC
  Administered 2021-10-08 – 2021-10-09 (×3): 100 mg via ORAL
  Filled 2021-10-08 (×3): qty 1

## 2021-10-08 MED ORDER — IBUPROFEN 800 MG PO TABS
800.0000 mg | ORAL_TABLET | Freq: Once | ORAL | Status: AC
  Administered 2021-10-08: 800 mg via ORAL
  Filled 2021-10-08: qty 1

## 2021-10-08 MED ORDER — LACTATED RINGERS IV SOLN: INTRAVENOUS | Status: AC

## 2021-10-08 MED ORDER — OXYCODONE HCL 5 MG PO TABS
10.0000 mg | ORAL_TABLET | ORAL | Status: DC | PRN
  Administered 2021-10-09 (×2): 10 mg via ORAL
  Filled 2021-10-08 (×4): qty 2

## 2021-10-08 MED ORDER — PROCHLORPERAZINE EDISYLATE 10 MG/2ML IJ SOLN
10.0000 mg | INTRAMUSCULAR | Status: DC | PRN
  Administered 2021-10-11 – 2021-10-17 (×2): 10 mg via INTRAVENOUS
  Filled 2021-10-08 (×2): qty 2

## 2021-10-08 MED ORDER — IOHEXOL 350 MG/ML SOLN: 100.0000 mL | Freq: Once | INTRAVENOUS | Status: DC | PRN

## 2021-10-08 MED ORDER — LACTATED RINGERS IV BOLUS
1000.0000 mL | Freq: Once | INTRAVENOUS | Status: AC
  Administered 2021-10-08: 1000 mL via INTRAVENOUS

## 2021-10-08 MED ORDER — METHOCARBAMOL 1000 MG/10ML IJ SOLN
500.0000 mg | Freq: Four times a day (QID) | INTRAVENOUS | Status: DC | PRN
  Administered 2021-10-10: 500 mg via INTRAVENOUS
  Filled 2021-10-08: qty 5

## 2021-10-08 MED ORDER — ACETAMINOPHEN 325 MG PO TABS
650.0000 mg | ORAL_TABLET | Freq: Four times a day (QID) | ORAL | Status: DC
  Administered 2021-10-08 – 2021-10-10 (×5): 650 mg via ORAL
  Filled 2021-10-08 (×5): qty 2

## 2021-10-08 MED ORDER — PIPERACILLIN-TAZOBACTAM 3.375 G IVPB
3.3750 g | Freq: Three times a day (TID) | INTRAVENOUS | Status: AC
  Administered 2021-10-08 – 2021-10-17 (×27): 3.375 g via INTRAVENOUS
  Filled 2021-10-08 (×28): qty 50

## 2021-10-08 MED ORDER — ENOXAPARIN SODIUM 40 MG/0.4ML IJ SOSY
40.0000 mg | PREFILLED_SYRINGE | Freq: Every day | INTRAMUSCULAR | Status: DC
  Administered 2021-10-08 – 2021-10-09 (×2): 40 mg via SUBCUTANEOUS
  Filled 2021-10-08 (×2): qty 0.4

## 2021-10-08 MED ORDER — PIPERACILLIN-TAZOBACTAM 3.375 G IVPB 30 MIN: 3.3750 g | Freq: Three times a day (TID) | INTRAVENOUS | Status: DC

## 2021-10-08 MED ORDER — SIMETHICONE 80 MG PO CHEW: 80.0000 mg | CHEWABLE_TABLET | Freq: Four times a day (QID) | ORAL | Status: DC | PRN

## 2021-10-08 NOTE — H&P (Addendum)
Admitting Physician: Hyman Hopes Tyion Boylen  Service: General Surgery  CC: Abdominal pain  Subjective   HPI: Ryan Herman is an 41 y.o. male who is here for abdominal pain.  He was admitted for perforated appendicitis from 09/08/2021 to 09/12/2021.  Interventional radiology placed a CT-guided drainage catheter in the right lower quadrant abscess on 09/08/2021.  The drain was removed and drain clinic on 10/04/2021.  Since drain removal he has developed mild right lower quadrant abdominal pain and he has noticed fevers with a maximum temperature of 101 F at home.  He presented to the [facility] and was transferred to the surgery service at [facility] for further management.  He has been eating normally and having bowel movements.  The pain and fever were the concerning symptoms bringing him in to the hospital.  Past Medical History:  Diagnosis Date   Anxiety    mild   Farsightedness    wears glasses   Genital herpes    diagnosed 2008   Microscopic hematuria 2013   [facility], Dr. [name], negative workup    Obesity    OSA (obstructive sleep apnea)    Pulmonary nodule seen on imaging study 2013   CT chest 2015, stable, benign, no further eval needed   Seborrheic dermatitis of scalp    [facility]    Past Surgical History:  Procedure Laterality Date   IR RADIOLOGIST EVAL & MGMT  09/20/2021   IR RADIOLOGIST EVAL & MGMT  10/04/2021   WISDOM TOOTH EXTRACTION      Family History  Problem Relation Age of Onset   Hypertension Mother    Diabetes Father        complications   Kidney disease Father        dialysis   Hypertension Brother    Cancer Maternal Uncle        prostate   Hypertension Brother    Stroke Neg Hx    Heart disease Neg Hx     Social:  reports that he quit smoking about 13 years ago. His smoking use included cigarettes. He has never used smokeless tobacco. He reports current alcohol use of about 1.0 standard drink of alcohol per week. He  reports that he does not use drugs.  Allergies: No Known Allergies  Medications: Current Outpatient Medications  Medication Instructions   acetaminophen (TYLENOL) 1,000 mg, Oral, Every 8 hours PRN   amLODipine (NORVASC) 10 MG tablet TAKE 1 TABLET BY MOUTH EVERY DAY   ciprofloxacin (CIPRO) 500 mg, Oral, 2 times daily   Claravis 40 mg, 2 times daily   hydrochlorothiazide (HYDRODIURIL) 25 mg, Oral, Daily   metoprolol succinate (TOPROL-XL) 25 mg, Oral, Daily   metroNIDAZOLE (FLAGYL) 500 mg, Oral, 3 times daily   Multiple Vitamins-Minerals (MULTIVITAMIN WITH MINERALS) tablet 1 tablet, Oral, Daily   oxyCODONE (ROXICODONE) 5 mg, Oral, Every 6 hours PRN   sodium chloride flush 0.9 % SOLN injection Use 60ml by intracatheter route as needed.  Flush abscess drain with 5 ml daily.   valACYclovir (VALTREX) 500 MG tablet TAKE 1 TABLET BY MOUTH EVERY DAY    ROS - all of the below systems have been reviewed with the patient and positives are indicated with bold text General: chills, fever or night sweats Eyes: blurry vision or double vision ENT: epistaxis or sore throat Allergy/Immunology: itchy/watery eyes or nasal congestion Hematologic/Lymphatic: bleeding problems, blood clots or swollen lymph nodes Endocrine: temperature intolerance or unexpected weight changes Breast: new or changing  breast lumps or nipple discharge Resp: cough, shortness of breath, or wheezing CV: chest pain or dyspnea on exertion GI: as per HPI GU: dysuria, trouble voiding, or hematuria MSK: joint pain or joint stiffness Neuro: TIA or stroke symptoms Derm: pruritus and skin lesion changes Psych: anxiety and depression  Objective   PE Blood pressure 137/89, pulse (!) 110, temperature 98.5 F (36.9 C), temperature source Oral, resp. rate 18, height 6\' 1"  (1.854 m), weight 124.8 kg, SpO2 97 %. Constitutional: NAD; conversant; no deformities Eyes: Moist conjunctiva; no lid lag; anicteric; PERRL Neck: Trachea midline;  no thyromegaly Lungs: Normal respiratory effort; no tactile fremitus CV: RRR; no palpable thrills; no pitting edema GI: Abd Right lower quadrant tenderness; no palpable hepatosplenomegaly MSK: Normal range of motion of extremities; no clubbing/cyanosis Psychiatric: Appropriate affect; alert and oriented x3 Lymphatic: No palpable cervical or axillary lymphadenopathy  Results for orders placed or performed during the hospital encounter of 10/08/21 (from the past 24 hour(s))  Lipase, blood     Status: None   Collection Time: 10/08/21 12:24 PM  Result Value Ref Range   Lipase 14 11 - 51 U/L  Comprehensive metabolic panel     Status: Abnormal   Collection Time: 10/08/21 12:24 PM  Result Value Ref Range   Sodium 137 135 - 145 mmol/L   Potassium 3.4 (L) 3.5 - 5.1 mmol/L   Chloride 99 98 - 111 mmol/L   CO2 27 22 - 32 mmol/L   Glucose, Bld 127 (H) 70 - 99 mg/dL   BUN 10 6 - 20 mg/dL   Creatinine, Ser 10/10/21 0.61 - 1.24 mg/dL   Calcium 9.9 8.9 - 7.25 mg/dL   Total Protein 8.6 (H) 6.5 - 8.1 g/dL   Albumin 4.4 3.5 - 5.0 g/dL   AST 12 (L) 15 - 41 U/L   ALT 17 0 - 44 U/L   Alkaline Phosphatase 59 38 - 126 U/L   Total Bilirubin 0.8 0.3 - 1.2 mg/dL   GFR, Estimated 36.6 >44 mL/min   Anion gap 11 5 - 15  Lactic acid, plasma     Status: None   Collection Time: 10/08/21 12:24 PM  Result Value Ref Range   Lactic Acid, Venous 1.3 0.5 - 1.9 mmol/L  CBC with Differential     Status: Abnormal   Collection Time: 10/08/21 12:24 PM  Result Value Ref Range   WBC 10.2 4.0 - 10.5 K/uL   RBC 4.83 4.22 - 5.81 MIL/uL   Hemoglobin 14.6 13.0 - 17.0 g/dL   HCT 10/10/21 47.4 - 25.9 %   MCV 88.0 80.0 - 100.0 fL   MCH 30.2 26.0 - 34.0 pg   MCHC 34.4 30.0 - 36.0 g/dL   RDW 56.3 87.5 - 64.3 %   Platelets 251 150 - 400 K/uL   nRBC 0.0 0.0 - 0.2 %   Neutrophils Relative % 75 %   Neutro Abs 7.7 1.7 - 7.7 K/uL   Lymphocytes Relative 12 %   Lymphs Abs 1.2 0.7 - 4.0 K/uL   Monocytes Relative 12 %   Monocytes  Absolute 1.2 (H) 0.1 - 1.0 K/uL   Eosinophils Relative 1 %   Eosinophils Absolute 0.1 0.0 - 0.5 K/uL   Basophils Relative 0 %   Basophils Absolute 0.0 0.0 - 0.1 K/uL   Immature Granulocytes 0 %   Abs Immature Granulocytes 0.03 0.00 - 0.07 K/uL  Urinalysis, Routine w reflex microscopic     Status: Abnormal   Collection Time: 10/08/21  1:57 PM  Result Value Ref Range   Color, Urine YELLOW YELLOW   APPearance CLEAR CLEAR   Specific Gravity, Urine >1.046 (H) 1.005 - 1.030   pH 6.0 5.0 - 8.0   Glucose, UA NEGATIVE NEGATIVE mg/dL   Hgb urine dipstick MODERATE (A) NEGATIVE   Bilirubin Urine NEGATIVE NEGATIVE   Ketones, ur NEGATIVE NEGATIVE mg/dL   Protein, ur TRACE (A) NEGATIVE mg/dL   Nitrite NEGATIVE NEGATIVE   Leukocytes,Ua NEGATIVE NEGATIVE   RBC / HPF 11-20 0 - 5 RBC/hpf   WBC, UA 0-5 0 - 5 WBC/hpf   Mucus PRESENT   Lactic acid, plasma     Status: None   Collection Time: 10/08/21  1:57 PM  Result Value Ref Range   Lactic Acid, Venous 1.3 0.5 - 1.9 mmol/L     Imaging Orders         CT ABDOMEN PELVIS W CONTRAST      Assessment and Plan   Gagan Dillion is an 41 y.o. male with perforated appendicitis.  He appears to have a residual fluid collection and infection in the right lower quadrant after a month with an IR drain which was recently removed.  I will start him on Zosyn and reviewed the images with the interventional radiology team.  At some point we will need to decide whether to continue to attempt conservative management, or proceed with surgery which may require a right colectomy.  The plan for tonight will be IV fluid resuscitation and IV antibiotics, and we will make further decisions in the morning with the radiology team and the surgery team coming on.     ICD-10-CM   1. Right lower quadrant abdominal pain  R10.31        Quentin Ore, MD  [facility] Use [email or web address] to contact on call provider  New Patient Billing: [id number] - Moderate  MDM

## 2021-10-08 NOTE — ED Provider Notes (Signed)
[facility] EMERGENCY DEPT Provider Note   CSN: [id number] Arrival date & time: 10/08/21  1208     History  Chief Complaint  Patient presents with   Abdominal Pain    Ryan Herman is a 41 y.o. male.  Past medical history of hypertension, obstructive sleep apnea and recent perforated appendicitis who presents to the emergency department with abdominal pain.  States that at the end of July he presented to the emergency department for abdominal pain and was diagnosed with a ruptured appendicitis with abscess.  States that he was transferred to [facility] and had a drain placed with IV antibiotics.  States that he has had the drain until this Monday, 10/04/2021 when it was removed.  He states that since then he has had fever and mild abdominal pain.  States that Tmax at home has been 101.  He has been using ibuprofen with intermittent relief of symptoms.  States that he is having some mild right lower quadrant abdominal pain and when he stands up he gets a radiating pain across his lower abdomen.  He denies any nausea, vomiting, dysuria, drainage from the previous drain site or anorexia.  On chart review appears that he was admitted on 09/04/2021 with his perforated appendicitis.  IR placed a 12 French drain and placed him on IV antibiotics.  He was discharged on 09/12/2021 to have follow-up with Dr. Sheliah Hatch 10/13/21 for possible surgery. Appears that he saw Dr. Milford Cage, IR, on Monday 10/04/21 for drain removal.    Abdominal Pain Associated symptoms: fever   Associated symptoms: no dysuria, no nausea and no vomiting        Home Medications Prior to Admission medications   Medication Sig Start Date End Date Taking? Authorizing Provider  acetaminophen (TYLENOL) 500 MG tablet Take 2 tablets (1,000 mg total) by mouth every 8 (eight) hours as needed for mild pain. 09/12/21   Maczis, Elmer Sow, PA-C  amLODipine (NORVASC) 10 MG tablet TAKE 1 TABLET BY MOUTH EVERY DAY Patient taking  differently: Take 10 mg by mouth daily. 01/17/19   Tysinger, [name], PA-C  ciprofloxacin (CIPRO) 500 MG tablet Take 1 tablet (500 mg total) by mouth 2 (two) times daily. 09/12/21   Maczis, Elmer Sow, PA-C  CLARAVIS 40 MG capsule Take 40 mg by mouth 2 (two) times daily. Patient not taking: Reported on 09/05/2021 05/31/21   [provider]  hydrochlorothiazide (HYDRODIURIL) 25 MG tablet Take 25 mg by mouth daily. 08/12/21   [provider]  metoprolol succinate (TOPROL-XL) 25 MG 24 hr tablet Take 25 mg by mouth daily. 06/11/21   [provider]  metroNIDAZOLE (FLAGYL) 500 MG tablet Take 1 tablet (500 mg total) by mouth 3 (three) times daily. 09/12/21   Maczis, Elmer Sow, PA-C  Multiple Vitamins-Minerals (MULTIVITAMIN WITH MINERALS) tablet Take 1 tablet by mouth daily.    [provider]  oxyCODONE (ROXICODONE) 5 MG immediate release tablet Take 1 tablet (5 mg total) by mouth every 6 (six) hours as needed for breakthrough pain. 09/12/21 09/12/22  Kinsinger, De Blanch, MD  sodium chloride flush 0.9 % SOLN injection Use 28ml by intracatheter route as needed.  Flush abscess drain with 5 ml daily. 09/12/21   Kinsinger, De Blanch, MD  valACYclovir (VALTREX) 500 MG tablet TAKE 1 TABLET BY MOUTH EVERY DAY Patient taking differently: Take 500 mg by mouth daily. 12/21/18   Tysinger, [name], PA-C      Allergies    Patient has no known allergies.  Review of Systems   Review of Systems  Constitutional:  Positive for fever. Negative for appetite change.  Gastrointestinal:  Positive for abdominal pain. Negative for nausea and vomiting.  Genitourinary:  Negative for dysuria and penile discharge.  All other systems reviewed and are negative.   Physical Exam Updated Vital Signs BP (!) 155/92 (BP Location: Right Arm)   Pulse (!) 126   Temp 99.4 F (37.4 C)   Resp 16   Ht 6\' 1"  (1.854 m)   Wt 124.8 kg   SpO2 100%   BMI 36.30 kg/m  Physical Exam Vitals and nursing note  reviewed.  Constitutional:      General: He is not in acute distress.    Appearance: Normal appearance. He is well-developed. He is obese. He is ill-appearing. He is not toxic-appearing.  HENT:     Head: Normocephalic and atraumatic.     Mouth/Throat:     Mouth: Mucous membranes are moist.  Eyes:     General: No scleral icterus.    Extraocular Movements: Extraocular movements intact.     Pupils: Pupils are equal, round, and reactive to light.  Cardiovascular:     Rate and Rhythm: Regular rhythm. Tachycardia present.     Heart sounds: Normal heart sounds. No murmur heard. Pulmonary:     Effort: Pulmonary effort is normal. No respiratory distress.     Breath sounds: Normal breath sounds.  Abdominal:     General: Abdomen is protuberant. A surgical scar is present. Bowel sounds are normal. There is no distension.     Palpations: Abdomen is soft.     Tenderness: There is abdominal tenderness in the right lower quadrant and suprapubic area. There is no guarding or rebound.     Comments: Surgical scar from drain is well healing without purulence, surrounding erythema, fluctuance or induration  Skin:    General: Skin is warm and dry.     Capillary Refill: Capillary refill takes less than 2 seconds.  Neurological:     General: No focal deficit present.     Mental Status: He is alert and oriented to person, place, and time.  Psychiatric:        Mood and Affect: Mood normal.        Behavior: Behavior normal.    ED Results / Procedures / Treatments   Labs (all labs ordered are listed, but only abnormal results are displayed) Labs Reviewed  COMPREHENSIVE METABOLIC PANEL - Abnormal; Notable for the following components:      Result Value   Potassium 3.4 (*)    Glucose, Bld 127 (*)    Total Protein 8.6 (*)    AST 12 (*)    All other components within normal limits  CBC WITH DIFFERENTIAL/PLATELET - Abnormal; Notable for the following components:   Monocytes Absolute 1.2 (*)    All  other components within normal limits  CULTURE, BLOOD (ROUTINE X 2)  CULTURE, BLOOD (ROUTINE X 2)  LIPASE, BLOOD  LACTIC ACID, PLASMA  LACTIC ACID, PLASMA  URINALYSIS, ROUTINE W REFLEX MICROSCOPIC   EKG None  Radiology CT ABDOMEN PELVIS W CONTRAST  Result Date: 10/08/2021 CLINICAL DATA:  Right lower quadrant abdominal pain. EXAM: CT ABDOMEN AND PELVIS WITH CONTRAST TECHNIQUE: Multidetector CT imaging of the abdomen and pelvis was performed using the standard protocol following bolus administration of intravenous contrast. RADIATION DOSE REDUCTION: This exam was performed according to the departmental dose-optimization program which includes automated exposure control, adjustment of the mA and/or kV according to  patient size and/or use of iterative reconstruction technique. CONTRAST:  OMNIPAQUE IOHEXOL 300 MG/ML  SOLN COMPARISON:  September 20, 2021. FINDINGS: Lower chest: No acute abnormality. Hepatobiliary: No focal liver abnormality is seen. No gallstones, gallbladder wall thickening, or biliary dilatation. Pancreas: Unremarkable. No pancreatic ductal dilatation or surrounding inflammatory changes. Spleen: Normal in size without focal abnormality. Adrenals/Urinary Tract: Adrenal glands are unremarkable. Kidneys are normal, without renal calculi, focal lesion, or hydronephrosis. Bladder is unremarkable. Stomach/Bowel: The stomach appears normal. Drainage catheter noted in right lower quadrant on prior exam has been removed. There remains a large amount of inflammatory stranding in the right lower quadrant, particularly involving the terminal ileum, appendix and cecum. These findings are consistent with the patient's history of appendicitis. Possible 3.2 x 2.1 cm fluid collection is seen in this area suggesting small abscess. Vascular/Lymphatic: No significant vascular findings are present. No enlarged abdominal or pelvic lymph nodes. Reproductive: Prostate is unremarkable. Other: No hernia is noted.   No ascites is noted. Musculoskeletal: No acute or significant osseous findings. IMPRESSION: There remains a large amount of inflammatory stranding in the right lower quadrant involving the terminal ileum, appendix and cecum, consistent with the patient's history of appendicitis. Drainage catheter noted in right lower quadrant on prior exam has been removed. However, there does appear to be a 3.2 x 2.1 cm fluid collection adjacent to the cecum in this area concerning for possible small residual or recurrent abscess. Electronically Signed   By: Lupita Raider M.D.   On: 10/08/2021 14:28    Procedures Procedures   Medications Ordered in ED Medications  lactated ringers bolus 1,000 mL (has no administration in time range)  lactated ringers bolus 1,000 mL (0 mLs Intravenous Stopped 10/08/21 1459)  iohexol (OMNIPAQUE) 300 MG/ML solution 100 mL (100 mLs Intravenous Contrast Given 10/08/21 1409)  acetaminophen (TYLENOL) tablet 650 mg (650 mg Oral Given 10/08/21 1458)    ED Course/ Medical Decision Making/ A&P                           Medical Decision Making Amount and/or Complexity of Data Reviewed Labs: ordered. Radiology: ordered.  Risk OTC drugs. Prescription drug management. Decision regarding hospitalization.  This patient presents to the ED with chief complaint(s) of abdominal pain with pertinent past medical history of perforated appendicitis with abscess which further complicates the presenting complaint. The complaint involves an extensive differential diagnosis and treatment options and also carries with it a high risk of complications and morbidity.    The differential diagnosis includes new abscess, sepsis, Acute hepatobiliary disease, pancreatitis, appendicitis, PUD, gastritis, SBO, diverticulitis, colitis, viral gastroenteritis, Crohn's, UC, vascular catastrophe, UTI, pyelonephritis, renal stone, obstructed stone, infected stone, testicular torsion, epididymitis, incarcerated  hernia, STD, etc.    Additional history obtained: Additional history obtained from  none available Records reviewed previous admission documents and Care Everywhere/External Records  ED Course: Lab Tests: I Ordered, and personally interpreted labs.  The pertinent results include:   CBC without leukocytosis CMP without transaminitis, AKI, severe electrolyte derangement Lipase negative Lactic negative Cultures pending Imaging Studies: I ordered and independently visualized and interpreted the following imaging CT scan abdomen pelvis with contrast   which showed large amount inflammatory stranding in the right lower quadrant involving the terminal ileum, appendix, cecum and a 3 x 2 cm fluid collection concerning for recurrent abscess The interpretation of the imaging was limited to assessing for emergent pathology, for which purpose it was ordered.  Cardiac Monitoring: The patient was maintained on a cardiac monitor.  I personally viewed and interpreted the cardiac monitor which showed an underlying rhythm of:  sinus tachycardia Medicines ordered and prescription drug management: I ordered the following medications IV fluids for tachycardia, Tylenol for fever I considered this additional medications: IV antibiotics Reevaluation of the patient after these medicines showed that the patient    stayed the same  Reassessment and review: 41 year old male who presents to the emergency department with abdominal pain and fever.  Pertinently, recently had perforated appendicitis with IR drain placement that was removed on Monday. He has focal tenderness to the right lower quadrant without peritonitic findings. He is febrile on presentation and tachycardic.  Initiated IV fluids.  Also obtaining CT abdomen pelvis with contrast. CT abdomen pelvis shows reaccumulation of fluid in the right lower quadrant along with large amount of inflammatory stranding concerning for reaccumulation of abscess.  This is  likely the source of patient's abdominal pain and fever. No evidence of pancreatitis.  No urinary symptoms concerning for UTI, pyelo-, stone.  CT without evidence of acute hepatobiliary disease, diverticulitis, colitis, viscus perforation, bowel obstruction.  Symptoms are also inconsistent with PUD, gastritis, Crohn's, ulcerative colitis  Consulted and spoke with Trixie Deis, PA-C with general surgery.  She recommends that the patient be transferred and admitted for ongoing management by the general surgery team.  She will place orders for admission.  The patient is agreement with this plan.  Consultations Obtained: I requested consultation with the admitting physician general surgery, Trixie Deis, PA-C , and discussed  findings as well as pertinent plan - they recommend: admission for ongoing management  Complexity of problems addressed: Patient's presentation is most consistent with  acute illness/injury with systemic symptoms During patient's assessment  Disposition: After consideration of the diagnostic results and the patient's response to treatment,  I feel that the patent would benefit from admission to general surgery .  Social Determinants of Health: Patient's  none identified   increases the complexity of managing their presentation  Final Clinical Impression(s) / ED Diagnoses Final diagnoses:  Right lower quadrant abdominal pain    Rx / DC Orders ED Discharge Orders     None         Cristopher Peru, PA-C 10/08/21 1506    Rolan Bucco, MD 10/11/21 [phone]

## 2021-10-08 NOTE — ED Notes (Signed)
Informed [place] - RN of pt's temp.

## 2021-10-08 NOTE — ED Triage Notes (Signed)
Patient here POV from Home.  States he had a Drain placed into Abscess caused by perforated Appendicitis in Late June. Drain was removed Monday. Then the Patient began to have ABD Discomfort Monday PM.  Pain is RLQ and radiates to Supapubic ABD. No N/V/D. 101 Fever at 0400 this AM.  NAD Noted during Triage. A&Ox4. GCS 15. Ambulatory.

## 2021-10-09 LAB — CBC
HCT: 37.4 % — ABNORMAL LOW (ref 39.0–52.0)
Hemoglobin: 13.3 g/dL (ref 13.0–17.0)
MCH: 30.9 pg (ref 26.0–34.0)
MCHC: 35.6 g/dL (ref 30.0–36.0)
MCV: 87 fL (ref 80.0–100.0)
Platelets: 207 10*3/uL (ref 150–400)
RBC: 4.3 MIL/uL (ref 4.22–5.81)
RDW: 13.5 % (ref 11.5–15.5)
WBC: 12.8 10*3/uL — ABNORMAL HIGH (ref 4.0–10.5)
nRBC: 0 % (ref 0.0–0.2)

## 2021-10-09 LAB — MAGNESIUM: Magnesium: 1.6 mg/dL — ABNORMAL LOW (ref 1.7–2.4)

## 2021-10-09 LAB — BASIC METABOLIC PANEL
Anion gap: 11 (ref 5–15)
BUN: 5 mg/dL — ABNORMAL LOW (ref 6–20)
CO2: 22 mmol/L (ref 22–32)
Calcium: 8.8 mg/dL — ABNORMAL LOW (ref 8.9–10.3)
Chloride: 102 mmol/L (ref 98–111)
Creatinine, Ser: 0.87 mg/dL (ref 0.61–1.24)
GFR, Estimated: >60 mL/min (ref >60)
Glucose, Bld: 129 mg/dL — ABNORMAL HIGH (ref 70–99)
Potassium: 2.9 mmol/L — ABNORMAL LOW (ref 3.5–5.1)
Sodium: 135 mmol/L (ref 135–145)

## 2021-10-09 NOTE — Progress Notes (Signed)
Patient is 41 yo male with perf'd appendix with intraabdominal abscess s/p drain placement by IR on 7/26.   CT on 8/7 showed resolution of the abscess.  Patient was last seen at the drain clinic on 8/21, drain injection did not show fistula therefore drain was removed.   Patient unfortunately developed abdominal pain and fever and presented to ED on 8/25, CT showed a fluid collection adjacent to the cecum in this area concerning for possible small residual or recurrent abscess.  CT reviewed by Dr. Miles Costain, might be too small for percutaneous drain placement.  Recommend cont abx and repeat scan in 2-3 days, if abscess enlarges, will re-visit patient for possible drain placement.   RN notified via secure chat, Dr. Luisa Hart paged.   Will review repeat CT when it is done.  Please call IR for questions and concerns.   Lynann Bologna Priyansh Pry PA-C 10/09/2021 12:26 PM

## 2021-10-09 NOTE — Progress Notes (Signed)
Patient alert and oriented. No distress noted, no complaints voiced. Patients vital signs rechecked; patient afebrile. Will continue to monitor as per protocol and orders.  10/09/21 0829  Assess: MEWS Score  Temp (!) 103.1 F (39.5 C)  BP 129/76  MAP (mmHg) 92  Pulse Rate (!) 124  Resp 17  SpO2 96 %  Assess: MEWS Score  MEWS Temp 2  MEWS Systolic 0  MEWS Pulse 2  MEWS RR 0  MEWS LOC 0  MEWS Score 4  MEWS Score Color Red  Assess: if the MEWS score is Yellow or Red  Were vital signs taken at a resting state? Yes  Focused Assessment Change from prior assessment (see assessment flowsheet)  Does the patient meet 2 or more of the SIRS criteria? No  MEWS guidelines implemented *See Row Information* No, vital signs rechecked  Treat  MEWS Interventions Administered scheduled meds/treatments  Pain Scale 0-10  Pain Score 4  Pain Type Acute pain  Pain Location Abdomen  Pain Orientation Right  Escalate  MEWS: Escalate Red: discuss with charge nurse/RN and provider, consider discussing with RRT  Notify: Charge Nurse/RN  Name of Charge Nurse/RN Notified Zee C.  Date Charge Nurse/RN Notified 10/09/21  Time Charge Nurse/RN Notified 0840  Assess: SIRS CRITERIA  SIRS Temperature  1  SIRS Pulse 1  SIRS Respirations  0  SIRS WBC 1  SIRS Score Sum  3

## 2021-10-09 NOTE — Progress Notes (Addendum)
Subjective/Chief Complaint: Pt with pain  About the same  Having fever and tachycardia    Objective: Vital signs in last 24 hours: Temp:  [98.5 F (36.9 C)-103.1 F (39.5 C)] 103.1 F (39.5 C) (08/26 0829) Pulse Rate:  [107-126] 124 (08/26 0829) Resp:  [15-29] 17 (08/26 0829) BP: (129-155)/(64-92) 129/76 (08/26 0829) SpO2:  [95 %-100 %] 96 % (08/26 0829) Weight:  [124.8 kg] 124.8 kg (08/25 1215) Last BM Date : 10/07/21 (per pt report)  Intake/Output from previous day: No intake/output data recorded. Intake/Output this shift: Total I/O In: 636.9 [I.V.:636.9] Out: -   General appearance: alert and cooperative Resp: clear to auscultation bilaterally Cardio: RRR GI: RLQ tenderness noted  Extremities: extremities normal, atraumatic, no cyanosis or edema  Lab Results:  Recent Labs    10/08/21 2208 10/09/21 0141  WBC 12.9* 12.8*  HGB 13.2 13.3  HCT 37.1* 37.4*  PLT 206 207   BMET Recent Labs    10/08/21 1224 10/08/21 2208 10/09/21 0141  NA 137  --  135  K 3.4*  --  2.9*  CL 99  --  102  CO2 27  --  22  GLUCOSE 127*  --  129*  BUN 10  --  5*  CREATININE 0.89 0.89 0.87  CALCIUM 9.9  --  8.8*   PT/INR No results for input(s): "LABPROT", "INR" in the last 72 hours. ABG No results for input(s): "PHART", "HCO3" in the last 72 hours.  Invalid input(s): "PCO2", "PO2"  Studies/Results: CT ABDOMEN PELVIS W CONTRAST  Result Date: 10/08/2021 CLINICAL DATA:  Right lower quadrant abdominal pain. EXAM: CT ABDOMEN AND PELVIS WITH CONTRAST TECHNIQUE: Multidetector CT imaging of the abdomen and pelvis was performed using the standard protocol following bolus administration of intravenous contrast. RADIATION DOSE REDUCTION: This exam was performed according to the departmental dose-optimization program which includes automated exposure control, adjustment of the mA and/or kV according to patient size and/or use of iterative reconstruction technique. CONTRAST:   OMNIPAQUE IOHEXOL 300 MG/ML  SOLN COMPARISON:  September 20, 2021. FINDINGS: Lower chest: No acute abnormality. Hepatobiliary: No focal liver abnormality is seen. No gallstones, gallbladder wall thickening, or biliary dilatation. Pancreas: Unremarkable. No pancreatic ductal dilatation or surrounding inflammatory changes. Spleen: Normal in size without focal abnormality. Adrenals/Urinary Tract: Adrenal glands are unremarkable. Kidneys are normal, without renal calculi, focal lesion, or hydronephrosis. Bladder is unremarkable. Stomach/Bowel: The stomach appears normal. Drainage catheter noted in right lower quadrant on prior exam has been removed. There remains a large amount of inflammatory stranding in the right lower quadrant, particularly involving the terminal ileum, appendix and cecum. These findings are consistent with the patient's history of appendicitis. Possible 3.2 x 2.1 cm fluid collection is seen in this area suggesting small abscess. Vascular/Lymphatic: No significant vascular findings are present. No enlarged abdominal or pelvic lymph nodes. Reproductive: Prostate is unremarkable. Other: No hernia is noted.  No ascites is noted. Musculoskeletal: No acute or significant osseous findings. IMPRESSION: There remains a large amount of inflammatory stranding in the right lower quadrant involving the terminal ileum, appendix and cecum, consistent with the patient's history of appendicitis. Drainage catheter noted in right lower quadrant on prior exam has been removed. However, there does appear to be a 3.2 x 2.1 cm fluid collection adjacent to the cecum in this area concerning for possible small residual or recurrent abscess. Electronically Signed   By: Lupita Raider M.D.   On: 10/08/2021 14:28    Anti-infectives: Anti-infectives (From admission,  onward)    Start     Dose/Rate Route Frequency Ordered Stop   10/08/21 2315  piperacillin-tazobactam (ZOSYN) IVPB 3.375 g        3.375 g 12.5 mL/hr over 240  Minutes Intravenous Every 8 hours 10/08/21 2230     10/08/21 2245  piperacillin-tazobactam (ZOSYN) IVPB 3.375 g  Status:  Discontinued        3.375 g 100 mL/hr over 30 Minutes Intravenous Every 8 hours 10/08/21 2151 10/08/21 2229       Assessment/Plan:  41 y.o. male with perforated appendicitis.  He appears to have a residual fluid collection and infection in the right lower quadrant after a month with an IR drain which was recently removed.   Pt desires attempt at conservative management.    On zosyn for now   Explained this may fail and he may require right colectomy.  He would like an attempted conservative treatment with antibiotics and drainage prior to this attempt.  We will ask IR to review films to see if he is an appropriate drain candidate.  If not, we will continue antibiotics for now and follow.  Diet can be advanced once determining need for drainage.  Total time 30 minutes   LOS: 1 day    Dortha Schwalbe  MD  10/09/2021

## 2021-10-09 NOTE — Progress Notes (Signed)
  Transition of Care [facility]) Screening Note   Patient Details  Name: Ryan Herman Date of Birth: 08-22-1980   Transition of Care [facility]) CM/SW Contact:    Bess Kinds, RN Phone Number: [phone] 10/09/2021, 11:22 AM    Transition of Care Department [facility]) has reviewed patient and no TOC needs have been identified at this time. We will continue to monitor patient advancement through interdisciplinary progression rounds. If new patient transition needs arise, please place a TOC consult.

## 2021-10-10 ENCOUNTER — Other Ambulatory Visit: Payer: Self-pay

## 2021-10-10 ENCOUNTER — Encounter (HOSPITAL_COMMUNITY): Payer: Self-pay

## 2021-10-10 ENCOUNTER — Inpatient Hospital Stay (HOSPITAL_COMMUNITY): Payer: Commercial Managed Care - PPO | Admitting: Anesthesiology

## 2021-10-10 ENCOUNTER — Encounter (HOSPITAL_COMMUNITY): Admission: EM | Disposition: A | Discharge status: Home Health | Payer: Self-pay | Source: Home / Self Care

## 2021-10-10 DIAGNOSIS — K3533 Acute appendicitis with perforation and localized peritonitis, with abscess: Secondary | ICD-10-CM

## 2021-10-10 HISTORY — PX: LAPAROSCOPY: SHX197

## 2021-10-10 LAB — ABO/RH: ABO/RH(D): O POS

## 2021-10-10 LAB — CBC
HCT: 35.2 % — ABNORMAL LOW (ref 39.0–52.0)
Hemoglobin: 12.5 g/dL — ABNORMAL LOW (ref 13.0–17.0)
MCH: 30.8 pg (ref 26.0–34.0)
MCHC: 35.5 g/dL (ref 30.0–36.0)
MCV: 86.7 fL (ref 80.0–100.0)
Platelets: 235 10*3/uL (ref 150–400)
RBC: 4.06 MIL/uL — ABNORMAL LOW (ref 4.22–5.81)
RDW: 13.3 % (ref 11.5–15.5)
WBC: 14.5 10*3/uL — ABNORMAL HIGH (ref 4.0–10.5)
nRBC: 0 % (ref 0.0–0.2)

## 2021-10-10 LAB — POCT I-STAT, CHEM 8
BUN: 5 mg/dL — ABNORMAL LOW (ref 6–20)
Calcium, Ion: 1.09 mmol/L — ABNORMAL LOW (ref 1.15–1.40)
Chloride: 96 mmol/L — ABNORMAL LOW (ref 98–111)
Creatinine, Ser: 0.8 mg/dL (ref 0.61–1.24)
Glucose, Bld: 123 mg/dL — ABNORMAL HIGH (ref 70–99)
HCT: 40 % (ref 39.0–52.0)
Hemoglobin: 13.6 g/dL (ref 13.0–17.0)
Potassium: 3.4 mmol/L — ABNORMAL LOW (ref 3.5–5.1)
Sodium: 134 mmol/L — ABNORMAL LOW (ref 135–145)
TCO2: 26 mmol/L (ref 22–32)

## 2021-10-10 LAB — BASIC METABOLIC PANEL
Anion gap: 12 (ref 5–15)
BUN: 5 mg/dL — ABNORMAL LOW (ref 6–20)
CO2: 23 mmol/L (ref 22–32)
Calcium: 8.6 mg/dL — ABNORMAL LOW (ref 8.9–10.3)
Chloride: 98 mmol/L (ref 98–111)
Creatinine, Ser: 1.01 mg/dL (ref 0.61–1.24)
GFR, Estimated: >60 mL/min (ref >60)
Glucose, Bld: 120 mg/dL — ABNORMAL HIGH (ref 70–99)
Potassium: 2.9 mmol/L — ABNORMAL LOW (ref 3.5–5.1)
Sodium: 133 mmol/L — ABNORMAL LOW (ref 135–145)

## 2021-10-10 LAB — TYPE AND SCREEN
ABO/RH(D): O POS
Antibody Screen: NEGATIVE

## 2021-10-10 LAB — MAGNESIUM: Magnesium: 1.6 mg/dL — ABNORMAL LOW (ref 1.7–2.4)

## 2021-10-10 SURGERY — LAPAROSCOPY, DIAGNOSTIC
Anesthesia: General | Site: Abdomen

## 2021-10-10 MED ORDER — ONDANSETRON HCL 4 MG/2ML IJ SOLN
INTRAMUSCULAR | Status: AC
  Filled 2021-10-10: qty 2

## 2021-10-10 MED ORDER — ACETAMINOPHEN 10 MG/ML IV SOLN
INTRAVENOUS | Status: DC | PRN
  Administered 2021-10-10: 1000 mg via INTRAVENOUS

## 2021-10-10 MED ORDER — FENTANYL CITRATE PF 50 MCG/ML IJ SOSY: 50.0000 ug | PREFILLED_SYRINGE | INTRAMUSCULAR | Status: DC | PRN

## 2021-10-10 MED ORDER — 0.9 % SODIUM CHLORIDE (POUR BTL) OPTIME
TOPICAL | Status: DC | PRN
  Administered 2021-10-10 (×2): 2000 mL
  Administered 2021-10-10: 1000 mL

## 2021-10-10 MED ORDER — PHENYLEPHRINE HCL-NACL 20-0.9 MG/250ML-% IV SOLN
INTRAVENOUS | Status: DC | PRN
  Administered 2021-10-10: 25 ug/min via INTRAVENOUS

## 2021-10-10 MED ORDER — LACTATED RINGERS IV SOLN: INTRAVENOUS | Status: DC

## 2021-10-10 MED ORDER — POTASSIUM CHLORIDE 10 MEQ/100ML IV SOLN
10.0000 meq | INTRAVENOUS | Status: AC
  Administered 2021-10-10 (×3): 10 meq via INTRAVENOUS
  Filled 2021-10-10 (×3): qty 100

## 2021-10-10 MED ORDER — MIDAZOLAM HCL 2 MG/2ML IJ SOLN
INTRAMUSCULAR | Status: DC | PRN
  Administered 2021-10-10: 2 mg via INTRAVENOUS

## 2021-10-10 MED ORDER — ACETAMINOPHEN 10 MG/ML IV SOLN
1000.0000 mg | Freq: Four times a day (QID) | INTRAVENOUS | Status: AC
  Administered 2021-10-10 – 2021-10-11 (×4): 1000 mg via INTRAVENOUS
  Filled 2021-10-10 (×4): qty 100

## 2021-10-10 MED ORDER — PROPOFOL 10 MG/ML IV BOLUS
INTRAVENOUS | Status: AC
  Filled 2021-10-10: qty 20

## 2021-10-10 MED ORDER — HYDROMORPHONE HCL 1 MG/ML IJ SOLN
0.5000 mg | INTRAMUSCULAR | Status: DC | PRN
  Administered 2021-10-10 – 2021-10-11 (×3): 1 mg via INTRAVENOUS
  Filled 2021-10-10 (×3): qty 1

## 2021-10-10 MED ORDER — ONDANSETRON HCL 4 MG/2ML IJ SOLN
INTRAMUSCULAR | Status: DC | PRN
  Administered 2021-10-10: 4 mg via INTRAVENOUS

## 2021-10-10 MED ORDER — DEXAMETHASONE SODIUM PHOSPHATE 10 MG/ML IJ SOLN
INTRAMUSCULAR | Status: AC
  Filled 2021-10-10: qty 1

## 2021-10-10 MED ORDER — PHENYLEPHRINE 80 MCG/ML (10ML) SYRINGE FOR IV PUSH (FOR BLOOD PRESSURE SUPPORT)
PREFILLED_SYRINGE | INTRAVENOUS | Status: DC | PRN
  Administered 2021-10-10 (×2): 80 ug via INTRAVENOUS

## 2021-10-10 MED ORDER — BUPIVACAINE LIPOSOME 1.3 % IJ SUSP
INTRAMUSCULAR | Status: DC | PRN
  Administered 2021-10-10: 20 mL

## 2021-10-10 MED ORDER — INDOCYANINE GREEN 25 MG IV SOLR
INTRAVENOUS | Status: DC | PRN
  Administered 2021-10-10: 7.5 mg via INTRAVENOUS

## 2021-10-10 MED ORDER — MIDAZOLAM HCL 2 MG/2ML IJ SOLN
INTRAMUSCULAR | Status: AC
  Filled 2021-10-10: qty 2

## 2021-10-10 MED ORDER — ORAL CARE MOUTH RINSE
15.0000 mL | Freq: Once | OROMUCOSAL | Status: AC
Start: 2021-10-10 — End: 2021-10-10

## 2021-10-10 MED ORDER — OXYCODONE HCL 5 MG PO TABS: 5.0000 mg | ORAL_TABLET | Freq: Once | ORAL | Status: DC | PRN

## 2021-10-10 MED ORDER — SUCCINYLCHOLINE CHLORIDE 200 MG/10ML IV SOSY
PREFILLED_SYRINGE | INTRAVENOUS | Status: DC | PRN
  Administered 2021-10-10: 160 mg via INTRAVENOUS

## 2021-10-10 MED ORDER — ROCURONIUM BROMIDE 10 MG/ML (PF) SYRINGE
PREFILLED_SYRINGE | INTRAVENOUS | Status: AC
  Filled 2021-10-10: qty 10

## 2021-10-10 MED ORDER — LIDOCAINE 2% (20 MG/ML) 5 ML SYRINGE
INTRAMUSCULAR | Status: AC
  Filled 2021-10-10: qty 5

## 2021-10-10 MED ORDER — PHENYLEPHRINE 80 MCG/ML (10ML) SYRINGE FOR IV PUSH (FOR BLOOD PRESSURE SUPPORT)
PREFILLED_SYRINGE | INTRAVENOUS | Status: AC
  Filled 2021-10-10: qty 10

## 2021-10-10 MED ORDER — LIDOCAINE 2% (20 MG/ML) 5 ML SYRINGE
INTRAMUSCULAR | Status: DC | PRN
  Administered 2021-10-10: 60 mg via INTRAVENOUS

## 2021-10-10 MED ORDER — PROMETHAZINE HCL 25 MG/ML IJ SOLN: 6.2500 mg | INTRAMUSCULAR | Status: DC | PRN

## 2021-10-10 MED ORDER — BUPIVACAINE LIPOSOME 1.3 % IJ SUSP
INTRAMUSCULAR | Status: AC
Start: 2021-10-10 — End: ?
  Filled 2021-10-10: qty 20

## 2021-10-10 MED ORDER — ORAL CARE MOUTH RINSE: 15.0000 mL | Freq: Once | OROMUCOSAL | Status: DC

## 2021-10-10 MED ORDER — KETAMINE HCL 10 MG/ML IJ SOLN
INTRAMUSCULAR | Status: DC | PRN
  Administered 2021-10-10: 30 mg via INTRAVENOUS
  Administered 2021-10-10: 20 mg via INTRAVENOUS

## 2021-10-10 MED ORDER — KETAMINE HCL 50 MG/5ML IJ SOSY
PREFILLED_SYRINGE | INTRAMUSCULAR | Status: AC
  Filled 2021-10-10: qty 5

## 2021-10-10 MED ORDER — SUGAMMADEX SODIUM 200 MG/2ML IV SOLN
INTRAVENOUS | Status: DC | PRN
  Administered 2021-10-10: 300 mg via INTRAVENOUS

## 2021-10-10 MED ORDER — SUCCINYLCHOLINE CHLORIDE 200 MG/10ML IV SOSY
PREFILLED_SYRINGE | INTRAVENOUS | Status: AC
  Filled 2021-10-10: qty 10

## 2021-10-10 MED ORDER — KETOROLAC TROMETHAMINE 30 MG/ML IJ SOLN
INTRAMUSCULAR | Status: DC | PRN
  Administered 2021-10-10: 30 mg via INTRAVENOUS

## 2021-10-10 MED ORDER — LACTATED RINGERS IV SOLN: INTRAVENOUS | Status: DC | PRN

## 2021-10-10 MED ORDER — ACETAMINOPHEN 10 MG/ML IV SOLN
INTRAVENOUS | Status: AC
  Filled 2021-10-10: qty 100

## 2021-10-10 MED ORDER — POTASSIUM CHLORIDE 10 MEQ/100ML IV SOLN
10.0000 meq | INTRAVENOUS | Status: AC
  Administered 2021-10-10: 10 meq via INTRAVENOUS
  Filled 2021-10-10: qty 100

## 2021-10-10 MED ORDER — ENOXAPARIN SODIUM 40 MG/0.4ML IJ SOSY
40.0000 mg | PREFILLED_SYRINGE | Freq: Every day | INTRAMUSCULAR | Status: DC
  Administered 2021-10-11 – 2021-10-19 (×9): 40 mg via SUBCUTANEOUS
  Filled 2021-10-10 (×9): qty 0.4

## 2021-10-10 MED ORDER — ROCURONIUM BROMIDE 10 MG/ML (PF) SYRINGE
PREFILLED_SYRINGE | INTRAVENOUS | Status: DC | PRN
  Administered 2021-10-10 (×2): 20 mg via INTRAVENOUS
  Administered 2021-10-10: 30 mg via INTRAVENOUS
  Administered 2021-10-10: 20 mg via INTRAVENOUS
  Administered 2021-10-10: 30 mg via INTRAVENOUS
  Administered 2021-10-10: 50 mg via INTRAVENOUS

## 2021-10-10 MED ORDER — FENTANYL CITRATE (PF) 250 MCG/5ML IJ SOLN
INTRAMUSCULAR | Status: AC
  Filled 2021-10-10: qty 5

## 2021-10-10 MED ORDER — FENTANYL CITRATE (PF) 250 MCG/5ML IJ SOLN
INTRAMUSCULAR | Status: DC | PRN
  Administered 2021-10-10: 100 ug via INTRAVENOUS
  Administered 2021-10-10 (×4): 50 ug via INTRAVENOUS

## 2021-10-10 MED ORDER — FENTANYL CITRATE (PF) 100 MCG/2ML IJ SOLN
INTRAMUSCULAR | Status: AC
  Filled 2021-10-10: qty 2

## 2021-10-10 MED ORDER — CHLORHEXIDINE GLUCONATE 0.12 % MT SOLN: 15.0000 mL | Freq: Once | OROMUCOSAL | Status: DC

## 2021-10-10 MED ORDER — CHLORHEXIDINE GLUCONATE 0.12 % MT SOLN
OROMUCOSAL | Status: AC
  Administered 2021-10-10: 15 mL via OROMUCOSAL
  Filled 2021-10-10: qty 15

## 2021-10-10 MED ORDER — DEXAMETHASONE SODIUM PHOSPHATE 10 MG/ML IJ SOLN
INTRAMUSCULAR | Status: DC | PRN
  Administered 2021-10-10: 10 mg via INTRAVENOUS

## 2021-10-10 MED ORDER — SUGAMMADEX SODIUM 500 MG/5ML IV SOLN
INTRAVENOUS | Status: AC
  Filled 2021-10-10: qty 5

## 2021-10-10 MED ORDER — FENTANYL CITRATE (PF) 100 MCG/2ML IJ SOLN
25.0000 ug | INTRAMUSCULAR | Status: DC | PRN
  Administered 2021-10-10 (×2): 50 ug via INTRAVENOUS

## 2021-10-10 MED ORDER — CHLORHEXIDINE GLUCONATE 0.12 % MT SOLN: 15.0000 mL | Freq: Once | OROMUCOSAL | Status: AC

## 2021-10-10 MED ORDER — OXYCODONE HCL 5 MG/5ML PO SOLN: 5.0000 mg | Freq: Once | ORAL | Status: DC | PRN

## 2021-10-10 MED ORDER — PROPOFOL 10 MG/ML IV BOLUS
INTRAVENOUS | Status: DC | PRN
  Administered 2021-10-10: 30 mg via INTRAVENOUS
  Administered 2021-10-10: 200 mg via INTRAVENOUS

## 2021-10-10 SURGICAL SUPPLY — 75 items
BAG COUNTER SPONGE SURGICOUNT (BAG) ×1 IMPLANT
BLADE CLIPPER SURG (BLADE) IMPLANT
BLADE SURG 11 STRL SS (BLADE) IMPLANT
CANISTER SUCT 3000ML PPV (MISCELLANEOUS) ×1 IMPLANT
CANISTER WOUNDNEG PRESSURE 500 (CANNISTER) IMPLANT
CHLORAPREP W/TINT 26 (MISCELLANEOUS) ×1 IMPLANT
COVER SURGICAL LIGHT HANDLE (MISCELLANEOUS) ×1 IMPLANT
DERMABOND ADVANCED (GAUZE/BANDAGES/DRESSINGS)
DERMABOND ADVANCED .7 DNX12 (GAUZE/BANDAGES/DRESSINGS) ×1 IMPLANT
DRAIN CHANNEL 19F RND (DRAIN) IMPLANT
DRAPE LAPAROSCOPIC ABDOMINAL (DRAPES) ×1 IMPLANT
DRAPE WARM FLUID 44X44 (DRAPES) ×1 IMPLANT
DRSG TEGADERM 2-3/8X2-3/4 SM (GAUZE/BANDAGES/DRESSINGS) IMPLANT
DRSG VAC ATS LRG SENSATRAC (GAUZE/BANDAGES/DRESSINGS) IMPLANT
ELECT BLADE 4.0 EZ CLEAN MEGAD (MISCELLANEOUS) ×1
ELECT REM PT RETURN 9FT ADLT (ELECTROSURGICAL) ×1
ELECTRODE BLDE 4.0 EZ CLN MEGD (MISCELLANEOUS) IMPLANT
ELECTRODE REM PT RTRN 9FT ADLT (ELECTROSURGICAL) ×1 IMPLANT
EVACUATOR SILICONE 100CC (DRAIN) IMPLANT
GAUZE SPONGE 2X2 8PLY STRL LF (GAUZE/BANDAGES/DRESSINGS) IMPLANT
GLOVE BIOGEL M STRL SZ7.5 (GLOVE) ×1 IMPLANT
GLOVE INDICATOR 8.0 STRL GRN (GLOVE) ×1 IMPLANT
GOWN STRL REUS W/ TWL LRG LVL3 (GOWN DISPOSABLE) ×3 IMPLANT
GOWN STRL REUS W/TWL 2XL LVL3 (GOWN DISPOSABLE) ×1 IMPLANT
GOWN STRL REUS W/TWL LRG LVL3 (GOWN DISPOSABLE) ×2
HANDLE SUCTION POOLE (INSTRUMENTS) IMPLANT
IRRIG SUCT STRYKERFLOW 2 WTIP (MISCELLANEOUS) ×1
IRRIGATION SUCT STRKRFLW 2 WTP (MISCELLANEOUS) IMPLANT
KIT BASIN OR (CUSTOM PROCEDURE TRAY) ×1 IMPLANT
KIT TURNOVER KIT B (KITS) ×1 IMPLANT
LIGASURE IMPACT 36 18CM CVD LR (INSTRUMENTS) IMPLANT
NDL HYPO 25GX1X1/2 BEV (NEEDLE) IMPLANT
NEEDLE HYPO 25GX1X1/2 BEV (NEEDLE) ×1 IMPLANT
NS IRRIG 1000ML POUR BTL (IV SOLUTION) ×1 IMPLANT
PACK SPY-PHI (KITS) IMPLANT
PAD ARMBOARD 7.5X6 YLW CONV (MISCELLANEOUS) ×2 IMPLANT
RELOAD PROXIMATE 75MM BLUE (ENDOMECHANICALS) ×2 IMPLANT
RELOAD STAPLE 60 2.6 WHT THN (STAPLE) IMPLANT
RELOAD STAPLE 60 3.6 BLU REG (STAPLE) IMPLANT
RELOAD STAPLE 75 3.8 BLU REG (ENDOMECHANICALS) IMPLANT
RELOAD STAPLER BLUE 60MM (STAPLE) ×3 IMPLANT
RELOAD STAPLER WHITE 60MM (STAPLE) ×2 IMPLANT
RETRACTOR WND ALEXIS 25 LRG (MISCELLANEOUS) IMPLANT
RTRCTR WOUND ALEXIS 25CM LRG (MISCELLANEOUS) ×1
SCISSORS LAP 5X35 DISP (ENDOMECHANICALS) IMPLANT
SET TUBE SMOKE EVAC HIGH FLOW (TUBING) ×1 IMPLANT
SHEARS HARMONIC ACE PLUS 36CM (ENDOMECHANICALS) IMPLANT
SLEEVE ENDOPATH XCEL 5M (ENDOMECHANICALS) ×1 IMPLANT
SOL ANTI FOG 6CC (MISCELLANEOUS) IMPLANT
SOLUTION ANTI FOG 6CC (MISCELLANEOUS) ×1
SPIKE FLUID TRANSFER (MISCELLANEOUS) ×2 IMPLANT
SPONGE T-LAP 18X18 ~~LOC~~+RFID (SPONGE) IMPLANT
STAPLE ECHEON FLEX 60 POW ENDO (STAPLE) IMPLANT
STAPLER GUN LINEAR PROX 60 (STAPLE) IMPLANT
STAPLER PROXIMATE 75MM BLUE (STAPLE) IMPLANT
STAPLER RELOAD BLUE 60MM (STAPLE) ×3
STAPLER RELOAD WHITE 60MM (STAPLE) ×2
STAPLER VISISTAT 35W (STAPLE) IMPLANT
SUCTION POOLE HANDLE (INSTRUMENTS) ×1
SUT ETHILON 2 0 FS 18 (SUTURE) IMPLANT
SUT MNCRL AB 4-0 PS2 18 (SUTURE) ×1 IMPLANT
SUT PDS AB 1 TP1 96 (SUTURE) IMPLANT
SUT SILK 2 0 SH CR/8 (SUTURE) IMPLANT
SUT SILK 3 0 SH CR/8 (SUTURE) IMPLANT
SWAB COLLECTION DEVICE MRSA (MISCELLANEOUS) IMPLANT
SWAB CULTURE ESWAB REG 1ML (MISCELLANEOUS) IMPLANT
SYR CONTROL 10ML LL (SYRINGE) IMPLANT
TOWEL GREEN STERILE (TOWEL DISPOSABLE) ×1 IMPLANT
TOWEL GREEN STERILE FF (TOWEL DISPOSABLE) ×1 IMPLANT
TRAY FOLEY W/BAG SLVR 16FR (SET/KITS/TRAYS/PACK) ×1
TRAY FOLEY W/BAG SLVR 16FR ST (SET/KITS/TRAYS/PACK) IMPLANT
TRAY LAPAROSCOPIC MC (CUSTOM PROCEDURE TRAY) ×1 IMPLANT
TROCAR XCEL BLUNT TIP 100MML (ENDOMECHANICALS) IMPLANT
TROCAR XCEL NON-BLD 11X100MML (ENDOMECHANICALS) IMPLANT
TROCAR Z-THREAD OPTICAL 5X100M (TROCAR) ×1 IMPLANT

## 2021-10-10 NOTE — Plan of Care (Signed)

## 2021-10-10 NOTE — Anesthesia Procedure Notes (Signed)
Procedure Name: Intubation Date/Time: 10/10/2021 9:42 AM  Performed by: Nils Pyle, CRNAPre-anesthesia Checklist: Patient identified, Emergency Drugs available, Suction available and Patient being monitored Patient Re-evaluated:Patient Re-evaluated prior to induction Oxygen Delivery Method: Circle System Utilized Preoxygenation: Pre-oxygenation with 100% oxygen Induction Type: IV induction, Rapid sequence and Cricoid Pressure applied Laryngoscope Size: Miller and 2 Grade View: Grade I Tube type: Oral Tube size: 7.5 mm Number of attempts: 1 Airway Equipment and Method: Stylet and Oral airway Placement Confirmation: ETT inserted through vocal cords under direct vision, positive ETCO2 and breath sounds checked- equal and bilateral Secured at: 23 cm Tube secured with: Tape Dental Injury: Teeth and Oropharynx as per pre-operative assessment

## 2021-10-10 NOTE — Progress Notes (Signed)
Interventional Radiology Brief Note:  Patient planned to go to OR today with Dr. Andrey Campanile for management of his intra-abodminal abscess.  Order for IR drain placement canceled per recommendation of Dr. Miles Costain 8/26 (see note).  Please reconsult if IR can be of assistance.   Loyce Dys, MS RD PA-C

## 2021-10-10 NOTE — Plan of Care (Signed)
  Problem: Education: Goal: Knowledge of General Education information will improve Description: Including pain rating scale, medication(s)/side effects and non-pharmacologic comfort measures 10/10/2021 0131 by Hortencia Pilar, RN Outcome: Progressing 10/10/2021 0128 by Hortencia Pilar, RN Outcome: Progressing   Problem: Health Behavior/Discharge Planning: Goal: Ability to manage health-related needs will improve 10/10/2021 0131 by Hortencia Pilar, RN Outcome: Progressing 10/10/2021 0128 by Hortencia Pilar, RN Outcome: Progressing   Problem: Clinical Measurements: Goal: Ability to maintain clinical measurements within normal limits will improve 10/10/2021 0131 by Hortencia Pilar, RN Outcome: Progressing 10/10/2021 0128 by Hortencia Pilar, RN Outcome: Progressing Goal: Will remain free from infection 10/10/2021 0131 by Hortencia Pilar, RN Outcome: Progressing 10/10/2021 0128 by Hortencia Pilar, RN Outcome: Progressing Goal: Diagnostic test results will improve 10/10/2021 0131 by Hortencia Pilar, RN Outcome: Progressing 10/10/2021 0128 by Hortencia Pilar, RN Outcome: Progressing Goal: Respiratory complications will improve 10/10/2021 0131 by Hortencia Pilar, RN Outcome: Progressing 10/10/2021 0128 by Hortencia Pilar, RN Outcome: Progressing Goal: Cardiovascular complication will be avoided 10/10/2021 0131 by Hortencia Pilar, RN Outcome: Progressing 10/10/2021 0128 by Hortencia Pilar, RN Outcome: Progressing   Problem: Activity: Goal: Risk for activity intolerance will decrease 10/10/2021 0131 by Hortencia Pilar, RN Outcome: Progressing 10/10/2021 0128 by Hortencia Pilar, RN Outcome: Progressing   Problem: Nutrition: Goal: Adequate nutrition will be maintained 10/10/2021 0131 by Hortencia Pilar, RN Outcome: Progressing 10/10/2021 0128 by Hortencia Pilar, RN Outcome: Progressing   Problem: Coping: Goal: Level of anxiety will decrease 10/10/2021 0131 by Hortencia Pilar, RN Outcome: Progressing 10/10/2021 0128 by Hortencia Pilar, RN Outcome: Progressing   Problem: Elimination: Goal: Will not experience complications related to bowel motility 10/10/2021 0131 by Hortencia Pilar, RN Outcome: Progressing 10/10/2021 0128 by Hortencia Pilar, RN Outcome: Progressing Goal: Will not experience complications related to urinary retention 10/10/2021 0131 by Hortencia Pilar, RN Outcome: Progressing 10/10/2021 0128 by Hortencia Pilar, RN Outcome: Progressing   Problem: Pain Managment: Goal: General experience of comfort will improve 10/10/2021 0131 by Hortencia Pilar, RN Outcome: Progressing 10/10/2021 0128 by Hortencia Pilar, RN Outcome: Progressing   Problem: Safety: Goal: Ability to remain free from injury will improve 10/10/2021 0131 by Hortencia Pilar, RN Outcome: Progressing 10/10/2021 0128 by Hortencia Pilar, RN Outcome: Progressing   Problem: Skin Integrity: Goal: Risk for impaired skin integrity will decrease 10/10/2021 0131 by Hortencia Pilar, RN Outcome: Progressing 10/10/2021 0128 by Hortencia Pilar, RN Outcome: Progressing

## 2021-10-10 NOTE — Op Note (Addendum)
10/10/2021  2:11 PM  PATIENT:  Ryan Herman  41 y.o. male  PRE-OPERATIVE DIAGNOSIS:  perforated Appendicitis  POST-OPERATIVE DIAGNOSIS:  perforated appendicitis with intra-abdominal abscess  PROCEDURE:  Procedure(s): LAPAROSCOPY DIAGNOSTIC, OPEN EXPLORATORY LAPAROTOMY, RIGHT COLECTOMY, DRAINAGE OF INTRAABDOMINAL ABSCESS, APLICATION OF WOUND VAC (>10cm2)  SURGEON:  Surgeon(s): Gaynelle Adu, MD   ASSISTANTS: Diamantina Monks, MD   ANESTHESIA:   general  DRAINS: (62fr) Jackson-Pratt drain(s) with closed bulb suction in the pelvis and right paracolic gutter, Nasogastric Tube, and Urinary Catheter (Foley)   LOCAL MEDICATIONS USED:  OTHER exparel  SPECIMEN:  Source of Specimen:  TI and right colon and Aspirate  DISPOSITION OF SPECIMEN:  PATHOLOGY  EBL: 200 mL  Findings: Portion of sigmoid colon adhered to cecum and appendix and TI mesentery.  Intra-abdominal abscess with large amount of purulence drained this was mainly lateral to the cecum and ascending colon extending into his lateral abdominal wall.  Transverse colon encased in fat and omentum  COUNTS:  YES  INDICATION FOR PROCEDURE: 41 year old male who presented in late July with ruptured appendicitis with an abscess.  He underwent percutaneous drain placement by interventional radiology on July 26.  He improved and was discharged on oral antibiotics.  He had a follow-up drain study on August 7 which revealed a resolved right lower quadrant abscess and some improvement in the right lower quadrant inflammatory process with residual appendicitis noted.  He was continued on antibiotics and the drain was left in.  He underwent a follow-up drain study on August 21 which demonstrated no residual abscess cavity or fistula and the drain was removed.  He subsequently redeveloped fever and right-sided abdominal pain and presented back to the emergency room in August 25.  He was admitted and started on IV antibiotics.  There was a  recurrence of a small abscess radiology did not feel it was amenable to percutaneous drainage.  Despite being on antibiotics the patient continued to have fevers and right-sided abdominal pain and a rising leukocytosis so I recommended procedure to the operating room for diagnostic laparoscopy, possible ex lap possible right colectomy.  Given the degree of inflammation in the right lower quadrant I did not think it would be possible to do an ileocecectomy.  Preoperatively the patient also had evidence of a portion of the sigmoid colon mesentery as part of the abscess cavity in the right lower quadrant.  PROCEDURE: Patient was maintained on preoperative Lovenox prophylaxis.  Patient was taken to the OR 1 at [facility] placed upon on the operating room table.  General endotracheal anesthesia was established.  Sequential compression devices were placed.  A Foley catheter was placed.  His arms were tucked at his side with the appropriate padding.  The patient was on scheduled therapeutic antibiotics.  A surgical timeout was performed.  I decided to Optiview the patient left upper quadrant.  A small incision was made at Palmer's point just below the subcostal margin.  Using a 0 degree 5 mm laparoscope through a 5 mm trocar I advanced it through all layers of the abdominal wall and carefully entered the abdominal cavity.  Pneumoperitoneum was smoothly established up to a patient pressure of 15 mmHg without any change in patient vitals.  The abdominal cavity was surveilled and there was no evidence of injury to surrounding structures.  There is an obvious inflammatory process in the right lower quadrant.  An additional 5 mm trocar was placed in the lower midline and 1 in the left lower  quadrant.  The patient was placed in Trendelenburg.  Patient had a redundant sigmoid and portion of the sigmoid colon mesentery was densely adhered to the right pelvic inlet around the cecal base and terminal ileum mesentery.  The  cecum was densely adhered to the right lateral abdominal wall.  There was also omentum sticking down in that area.  I was not really get a be able to mobilize it laparoscopically in that location.  I realize that we are going to be committed to a right colectomy at this point so I decided to start taking down the mobilizing the proximal transverse colon and the hepatic flexure laparoscopically.  Patient was placed flat.  Patient had large amount of omentum going down into the right lateral abdominal quadrant.  I was able to get in the plane between the transverse colon and the omentum.  The gallbladder and liver were visualized.  I took off some of the omentum off the proximal transverse colon with harmonic scalpel.  I then started taking down the hepatic flexure using combination of blunt dissection laparoscopically along with harmonic scalpel.  I was able to roll some of the hepatic flexure medially.  Ended up placing a supraumbilical 5 mm trocar to help with mobilization.  I started mobilize some of the ascending colon from the hepatic flexure.  The ascending colon was densely adherent to the right paracolic gutter.  Due to the dense adhesions I felt it was safest to convert to an open procedure at this point.  The supraumbilical and lower midline trocars were removed.  I then made a midline sharply with a 10 blade.  Subcu tissue was divided and the fascia was divided and the abdominal cavity was entered.  A large wound protector was placed.  The patient's left arm was placed out on board at this point.  A Bookwalter retractor was placed.  I first went about mobilizing the sigmoid colon mesentery off the right pelvic inlet at the base of the cecum and terminal ileum mesentery.  This was done with a combination of finger fracture along with right angle and Metzenbaums.  As mentioned before the sigmoid colon was redundant in this location.  There is some reactive induration of the sigmoid colon in this location.  I  was ultimately able to free the sigmoid colon and pack it out of the way.  Ended up mobilizing the cecum mainly bluntly due to the dense woody inflammation at the right pelvic inlet. Stayed anterior to the retroperitoneum..  I got into a large purulent intra-abdominal abscess that was lateral to the cecum extending into the patient's right lateral abdominal wall.  Cultures were obtained.  There is probably about 40 to 50 cc of pus.  I decided to go ahead and divide the terminal ileum.  A small mesenteric window was created I then divided the terminal ileum with a Echelon 60 mm white load.  We identified the appendix and freed it from a large inflammatory rind in the right pelvic inlet.  I then was able to mobilize the ascending colon medially.  I took down some of the terminal ileum mesentery and proximal colon mesentery with LigaSure device as well as with a Kelly and 2-0 silk ties.  The duodenum was visualized and protected.  The patient had just a large amount of fat encasing his transverse colon.  We finished mobilizing the hepatic flexure with LigaSure device ensuring we were away from the duodenum.  Identified a portion of the transverse colon  where I thought we could staple across.  This was just around the right branch of the middle colic vessel.  A window was made in the mesentery I then divided the transverse colon with 2 fires of an Echelon 60 blue load.  There was a small corner of the transverse colon staple line that looked a little bit ischemic.  I ended up creating a new fresh staple line along the transverse colon with another fire of the Echelon 60.  I left this alone for a few minutes while we irrigated the abdomen with several liters of saline.  The transverse colon staple line appeared viable.  There was a palpable pulse along the edge of the mesentery.  We did administer ICG dye but the hand-held camera had a technical error and therefore we could not use near infrared imaging.  Nonetheless  there was a palpable pulse along the mesentery.  The staple line appeared quite viable.  The terminal ileum and transverse colon were brought together to create a side-to-side anastomosis.  An enterotomy was made in the small bowel and a colotomy was made in the transverse colon.  1 limb of a Endo GIA 75 stapler with a blue load was placed through each of the enterotomies and the stapler was fired to create a common channel.  It was closed with a single firing of the TA 60 blue load.  A 3-0 silk suture was placed in the crotch of the anastomosis.  I then imbricated the corner of the staple lines with 3-0 silk sutures.  We then took a tongue of omentum and tacked it over the staple lines.  We irrigated the abdomen and additional 2 L.  The Terressa Koyanagi was removed along with the wound protector.  Exparel was infiltrated along the fascial edges.  We then closed the abdomen with #1 looped PDS 1 from above and 1 from below.  A wound VAC was placed.  Black sponge was cut to appropriate size and placed into the midline wound and secured with plastic draping had a good seal.  The left subcostal trocar site was closed with a skin stapler.  It should be noted that prior to closing the abdomen a 19 French round drain was placed in through the left lower quadrant trocar site and brought around down into the pelvis and up the paracolic gutter where that large inflammatory line was and that intra-abdominal abscess had been drained.  The drain was secured to the skin with a 2-0 nylon.  All needle, instrument sponge counts were correct x2.  The patient was extubated taken recovery in stable condition  CASE DATA:  Type of patient?: DOW CASE (Surgical Hospitalist [facility])  Status of Case? URGENT Add On  Infection Present At Time Of Surgery (PATOS)?  ABSCESS in the right lateral abdomen, moderate amount of purulence      PLAN OF CARE:  already inpatient  PATIENT DISPOSITION:  PACU - hemodynamically stable.   Delay  start of Pharmacological VTE agent (>24hrs) due to surgical blood loss or risk of bleeding:  no  Mary Sella. Andrey Campanile, MD, FACS General, Bariatric, & Minimally Invasive Surgery [facility], [place]

## 2021-10-10 NOTE — Anesthesia Postprocedure Evaluation (Signed)
Anesthesia Post Note  Patient: Ashaad Gaertner  Procedure(s) Performed: LAPAROSCOPY DIAGNOSTIC, OPEN EXPLORATORY LAPAROTOMY, RIGHT COLECTOMY, DRAINAGE OF INTRAABDOMINAL ABSCESS, APLICATION OF WOUND VAC (Abdomen)     Patient location during evaluation: PACU Anesthesia Type: General Level of consciousness: awake and alert Pain management: pain level controlled Vital Signs Assessment: post-procedure vital signs reviewed and stable Respiratory status: spontaneous breathing, nonlabored ventilation and respiratory function stable Cardiovascular status: stable and blood pressure returned to baseline Anesthetic complications: no   No notable events documented.  Last Vitals:  Vitals:   10/10/21 1445 10/10/21 1508  BP: 116/77 123/81  Pulse: 97 98  Resp: 19 16  Temp: 36.8 C 36.6 C  SpO2: 99% 91%                  Beryle Lathe

## 2021-10-10 NOTE — Progress Notes (Signed)
Arrived back from PACU, bedside handoff completed with JJ.  NG tube to left nare connected to LIWS.  Wound Vac dressing intact and machine on.     10/10/21 1508  Vitals  Temp 97.8 F (36.6 C)  Temp Source Oral  BP 123/81  MAP (mmHg) 95  BP Location Right Arm  BP Method Automatic  Patient Position (if appropriate) Lying  Pulse Rate 98  Pulse Rate Source Dinamap  Resp 16  Level of Consciousness  Level of Consciousness Alert  MEWS COLOR  MEWS Score Color Green  Oxygen Therapy  SpO2 91 %  O2 Device Room Air  MEWS Score  MEWS Temp 0  MEWS Systolic 0  MEWS Pulse 0  MEWS RR 0  MEWS LOC 0  MEWS Score 0

## 2021-10-10 NOTE — Progress Notes (Signed)
Patient ID: Ryan Herman, male   DOB: 12/04/1980, 41 y.o.   MRN: [id number]   Acute Care Surgery Service Progress Note:    Chief Complaint/Subjective: Still spiking fevers Still with right sided abd pain esp when moves Flatus and BM  Objective: Vital signs in last 24 hours: Temp:  [98.7 F (37.1 C)-103.1 F (39.5 C)] 98.7 F (37.1 C) (08/27 0742) Pulse Rate:  [104-124] 110 (08/27 0742) Resp:  [17-18] 17 (08/27 0742) BP: (111-137)/(73-87) 121/80 (08/27 0742) SpO2:  [94 %-97 %] 96 % (08/27 0742) Last BM Date : 10/07/21  Intake/Output from previous day: 08/26 0701 - 08/27 0700 In: 1216.8 [I.V.:1119.4; IV Piggyback:97.4] Out: -  Intake/Output this shift: Total I/O In: 1655.7 [I.V.:1655.7] Out: -   Lungs: cta, nonlabored  Cardiovascular: reg  Abd: soft, obese, TTP RLQ/rt abd, no peritonitis  Extremities: no edema, +SCDs  Neuro: alert, nonfocal  Lab Results: CBC  Recent Labs    10/09/21 0141 10/10/21 0056  WBC 12.8* 14.5*  HGB 13.3 12.5*  HCT 37.4* 35.2*  PLT 207 235   BMET Recent Labs    10/09/21 0141 10/10/21 0056  NA 135 133*  K 2.9* 2.9*  CL 102 98  CO2 22 23  GLUCOSE 129* 120*  BUN 5* 5*  CREATININE 0.87 1.01  CALCIUM 8.8* 8.6*   LFT    Latest Ref Rng & Units 10/08/2021   12:24 PM 09/04/2021   12:42 PM 03/19/2018    2:40 PM  Hepatic Function  Total Protein 6.5 - 8.1 g/dL 8.6  8.4  7.3   Albumin 3.5 - 5.0 g/dL 4.4  4.2  4.3   AST 15 - 41 U/L 12  22  17   ALT 0 - 44 U/L 17  24  23   Alk Phosphatase 38 - 126 U/L 59  64  58   Total Bilirubin 0.3 - 1.2 mg/dL 0.8  0.6  0.4    PT/INR No results for input(s): "LABPROT", "INR" in the last 72 hours. ABG No results for input(s): "PHART", "HCO3" in the last 72 hours.  Invalid input(s): "PCO2", "PO2"  Studies/Results:  Anti-infectives: Anti-infectives (From admission, onward)    Start     Dose/Rate Route Frequency Ordered Stop   10/08/21 2315  piperacillin-tazobactam (ZOSYN) IVPB 3.375 g         3.375 g 12.5 mL/hr over 240 Minutes Intravenous Every 8 hours 10/08/21 2230     10/08/21 2245  piperacillin-tazobactam (ZOSYN) IVPB 3.375 g  Status:  Discontinued        3.375 g 100 mL/hr over 30 Minutes Intravenous Every 8 hours 10/08/21 2151 10/08/21 2229       Medications: Scheduled Meds:  acetaminophen  650 mg Oral Q6H   docusate sodium  100 mg Oral BID   enoxaparin (LOVENOX) injection  40 mg Subcutaneous QHS   gabapentin  300 mg Oral TID   Continuous Infusions:  lactated ringers 100 mL/hr at 10/10/21 0752   methocarbamol (ROBAXIN) IV     piperacillin-tazobactam (ZOSYN)  IV 3.375 g (10/10/21 0504)   potassium chloride     PRN Meds:.HYDROmorphone (DILAUDID) injection, methocarbamol (ROBAXIN) IV, ondansetron (ZOFRAN) IV, oxyCODONE, oxyCODONE, prochlorperazine, simethicone  Assessment/Plan: Patient Active Problem List   Diagnosis Date Noted   Ruptured appendicitis 09/04/2021   Acute perforated appendicitis 09/04/2021   Anxiety 08/06/2018   Acute stress reaction 05/09/2018   Caregiver burden 05/09/2018   Essential hypertension, benign 04/30/2018   Influenza vaccination declined 03/19/2018   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/24/2017     Pain in both feet 02/24/2017   OSA on CPAP 02/17/2016   Routine general medical examination at a health care facility 02/17/2016   Microscopic hematuria 02/17/2016   Encounter for health maintenance examination in adult 02/10/2015   Insomnia 02/10/2015   Impaired fasting blood sugar 02/10/2015   Obesity 02/05/2014   Ruptured appendicitis H/o perc drain Hypokalemia Obesity Osa on cpap  Patient continues to spike high fevers and has a rising leukocytosis.  He is still tender.  Radiology reviewed the imaging and there is not a drainable abscess.  At this point he has failed nonoperative management.  I explained to the patient that there really was not much more we could offer because he is already on antibiotics and despite this he continues to spike  fevers, have abdominal pain and have an increasing leukocytosis.  I recommend proceeding to the operating room for diagnostic laparoscopy possible appendectomy possible ex lap with partial colectomy-right colectomy.  Explained that while his inflammatory process in the right lower quadrant is improved from a month ago there is still significant inflammation and it would be unclear whether or not we could just do a straightforward appendectomy until we get in there and see how bad the inflammation is.  If the inflammation is quite extensive then we would need to do a right colectomy.  I do not think an ileocecectomy would be a good option given the right lower quadrant inflammation.  I discussed the procedure in detail.   We discussed the risks and benefits of surgery including, but not limited to bleeding, infection (such as wound infection, abdominal abscess), injury to surrounding structures, blood clot formation, urinary retention, incisional hernia, anastomotic stricture, anastomotic leak, anesthesia risks, pulmonary & cardiac complications such as pneumonia &/or heart attack, postop abscess, need for additional procedures, ileus, & prolonged hospitalization.  We discussed the typical postoperative recovery course, including limitations & restrictions postoperatively. I explained that the likelihood of improvement in their symptoms is good.  Patient has elected to proceed to the operating room.  Replace potassium  Continue perioperative VTE prophylaxis Disposition: to OR later this am   LOS: 2 days    Eric M. Wilson, MD, FACS General, Bariatric, & Minimally Invasive Surgery [phone] [facility]  

## 2021-10-10 NOTE — Progress Notes (Signed)
Received called from OR that they were ready for patient.  Called report to [facility] in Short Stay unit.  Patient has been NPO, CHG bath not completed at this time due to OR calling for patient, they stated that it could be done in Short Stay unit, notified Lynden Ang of this when we spoke.  Consent witnessed.  Patient changed into gown and what portion of pre-op checklist I was able to complete done.

## 2021-10-10 NOTE — Progress Notes (Signed)
Pt. Declined for any one to get text message up date for surgery.

## 2021-10-10 NOTE — Transfer of Care (Signed)
Immediate Anesthesia Transfer of Care Note  Patient: Ryan Herman  Procedure(s) Performed: LAPAROSCOPY DIAGNOSTIC, OPEN EXPLORATORY LAPAROTOMY, RIGHT COLECTOMY, DRAINAGE OF INTRAABDOMINAL ABSCESS, APLICATION OF WOUND VAC (Abdomen)  Patient Location: PACU  Anesthesia Type:General  Level of Consciousness: awake and drowsy  Airway & Oxygen Therapy: Patient Spontanous Breathing and Patient connected to nasal cannula oxygen  Post-op Assessment: Report given to RN, Post -op Vital signs reviewed and stable and Patient moving all extremities X 4  Post vital signs: Reviewed and stable  Last Vitals:  Vitals Value Taken Time  BP 120/78 10/10/21 1345  Temp    Pulse 103 10/10/21 1346  Resp 20 10/10/21 1346  SpO2 90 % 10/10/21 1346  Vitals shown include unvalidated device data.  Last Pain:  Vitals:   10/10/21 0906  TempSrc:   PainSc: 9       Patients Stated Pain Goal: 0 (10/08/21 2238)  Complications: No notable events documented.

## 2021-10-10 NOTE — Anesthesia Preprocedure Evaluation (Addendum)
Anesthesia Evaluation  Patient identified by MRN, date of birth, ID band Patient awake    Reviewed: Allergy & Precautions, NPO status , Patient's Chart, lab work & pertinent test results  History of Anesthesia Complications Negative for: history of anesthetic complications  Airway Mallampati: I  TM Distance: >3 FB Neck ROM: Full    Dental  (+) Dental Advisory Given, Teeth Intact   Pulmonary sleep apnea and Continuous Positive Airway Pressure Ventilation , former smoker,    Pulmonary exam normal        Cardiovascular hypertension, Pt. on medications Normal cardiovascular exam     Neuro/Psych PSYCHIATRIC DISORDERS Anxiety negative neurological ROS     GI/Hepatic Neg liver ROS,  Perforated appendicitis    Endo/Other   K 2.9 Obesity   Renal/GU negative Renal ROS     Musculoskeletal negative musculoskeletal ROS (+)   Abdominal   Peds  Hematology  (+) Blood dyscrasia, anemia ,   Anesthesia Other Findings HSV  Reproductive/Obstetrics                            Anesthesia Physical Anesthesia Plan  ASA: 2 and emergent  Anesthesia Plan: General   Post-op Pain Management: Ketamine IV*, Tylenol PO (pre-op)* and Toradol IV (intra-op)*   Induction: Intravenous, Rapid sequence and Cricoid pressure planned  PONV Risk Score and Plan: 2 and Treatment may vary due to age or medical condition, Ondansetron, Dexamethasone and Midazolam  Airway Management Planned: Oral ETT  Additional Equipment: None  Intra-op Plan:   Post-operative Plan: Extubation in OR  Informed Consent: I have reviewed the patients History and Physical, chart, labs and discussed the procedure including the risks, benefits and alternatives for the proposed anesthesia with the patient or authorized representative who has indicated his/her understanding and acceptance.     Dental advisory given  Plan Discussed with: CRNA  and Anesthesiologist  Anesthesia Plan Comments:       Anesthesia Quick Evaluation

## 2021-10-10 NOTE — Plan of Care (Signed)

## 2021-10-11 ENCOUNTER — Inpatient Hospital Stay: Payer: Self-pay

## 2021-10-11 ENCOUNTER — Encounter (HOSPITAL_COMMUNITY): Payer: Self-pay | Admitting: General Surgery

## 2021-10-11 LAB — CBC
HCT: 35.7 % — ABNORMAL LOW (ref 39.0–52.0)
Hemoglobin: 12.6 g/dL — ABNORMAL LOW (ref 13.0–17.0)
MCH: 30.8 pg (ref 26.0–34.0)
MCHC: 35.3 g/dL (ref 30.0–36.0)
MCV: 87.3 fL (ref 80.0–100.0)
Platelets: 270 10*3/uL (ref 150–400)
RBC: 4.09 MIL/uL — ABNORMAL LOW (ref 4.22–5.81)
RDW: 13.1 % (ref 11.5–15.5)
WBC: 14.7 10*3/uL — ABNORMAL HIGH (ref 4.0–10.5)
nRBC: 0 % (ref 0.0–0.2)

## 2021-10-11 LAB — BASIC METABOLIC PANEL
Anion gap: 9 (ref 5–15)
BUN: 7 mg/dL (ref 6–20)
CO2: 26 mmol/L (ref 22–32)
Calcium: 8.3 mg/dL — ABNORMAL LOW (ref 8.9–10.3)
Chloride: 99 mmol/L (ref 98–111)
Creatinine, Ser: 0.86 mg/dL (ref 0.61–1.24)
GFR, Estimated: >60 mL/min (ref >60)
Glucose, Bld: 147 mg/dL — ABNORMAL HIGH (ref 70–99)
Potassium: 3.7 mmol/L (ref 3.5–5.1)
Sodium: 134 mmol/L — ABNORMAL LOW (ref 135–145)

## 2021-10-11 LAB — PREALBUMIN: Prealbumin: 6 mg/dL — ABNORMAL LOW (ref 18–38)

## 2021-10-11 LAB — GLUCOSE, CAPILLARY
Glucose-Capillary: 132 mg/dL — ABNORMAL HIGH (ref 70–99)
Glucose-Capillary: 129 mg/dL — ABNORMAL HIGH (ref 70–99)

## 2021-10-11 MED ORDER — SODIUM CHLORIDE 0.9% FLUSH
10.0000 mL | INTRAVENOUS | Status: DC | PRN
  Administered 2021-10-14 – 2021-10-16 (×2): 10 mL
  Administered 2021-10-19: 20 mL

## 2021-10-11 MED ORDER — CHLORHEXIDINE GLUCONATE CLOTH 2 % EX PADS
6.0000 | MEDICATED_PAD | Freq: Every day | CUTANEOUS | Status: DC
  Administered 2021-10-11 – 2021-10-19 (×9): 6 via TOPICAL

## 2021-10-11 MED ORDER — INSULIN ASPART 100 UNIT/ML IJ SOLN
0.0000 [IU] | INTRAMUSCULAR | Status: DC
  Administered 2021-10-11 – 2021-10-12 (×3): 2 [IU] via SUBCUTANEOUS
  Administered 2021-10-12: 3 [IU] via SUBCUTANEOUS
  Administered 2021-10-12 – 2021-10-15 (×15): 2 [IU] via SUBCUTANEOUS
  Administered 2021-10-15 (×3): 3 [IU] via SUBCUTANEOUS
  Administered 2021-10-16: 2 [IU] via SUBCUTANEOUS
  Administered 2021-10-16 (×2): 3 [IU] via SUBCUTANEOUS
  Administered 2021-10-16 (×2): 2 [IU] via SUBCUTANEOUS
  Administered 2021-10-16: 3 [IU] via SUBCUTANEOUS
  Administered 2021-10-17: 2 [IU] via SUBCUTANEOUS
  Administered 2021-10-17 (×2): 3 [IU] via SUBCUTANEOUS
  Administered 2021-10-17: 2 [IU] via SUBCUTANEOUS
  Administered 2021-10-17 (×2): 3 [IU] via SUBCUTANEOUS
  Administered 2021-10-18 (×2): 2 [IU] via SUBCUTANEOUS
  Administered 2021-10-18: 3 [IU] via SUBCUTANEOUS

## 2021-10-11 MED ORDER — HYDROMORPHONE HCL 1 MG/ML IJ SOLN
0.5000 mg | INTRAMUSCULAR | Status: DC | PRN
  Administered 2021-10-11 – 2021-10-12 (×4): 1 mg via INTRAVENOUS
  Filled 2021-10-11 (×4): qty 1

## 2021-10-11 MED ORDER — ACETAMINOPHEN 10 MG/ML IV SOLN
1000.0000 mg | Freq: Four times a day (QID) | INTRAVENOUS | Status: AC
  Administered 2021-10-11 – 2021-10-12 (×4): 1000 mg via INTRAVENOUS
  Filled 2021-10-11 (×4): qty 100

## 2021-10-11 MED ORDER — POTASSIUM CHLORIDE 10 MEQ/100ML IV SOLN
10.0000 meq | INTRAVENOUS | Status: AC
  Administered 2021-10-11 (×3): 10 meq via INTRAVENOUS
  Filled 2021-10-11 (×3): qty 100

## 2021-10-11 MED ORDER — DEXTROSE 5 % IV SOLN: INTRAVENOUS | Status: AC

## 2021-10-11 NOTE — Progress Notes (Addendum)
PHARMACY - TOTAL PARENTERAL NUTRITION CONSULT NOTE   Indication: Prolonged ileus, s/p ex lap w/ R colectomy, intra-abdominal abscess drainage 8/27  Patient Measurements: Height: 6\' 1"  (185.4 cm) Weight: 124.8 kg (275 lb 2.2 oz) IBW/kg (Calculated) : 79.9 TPN AdjBW (KG): 91.1 Body mass index is 36.3 kg/m.  Assessment: 41 yo male presented on 8/25 for abdominal pain. Recent history of admission for perforated appendicitis 7/26-7/30/23. IR placed CT-guided drainage catheter in RLQ abscess on 7/26 which was removed on 8/21 - noted mild RLQ abdominal pain since drain removal. CT on arrival this admit concerning for small residual or recurrent abscess not amendable to drainage and perforated appendicitis. Patient underwent ex lap w/ R colectomy and abscess drainage on 10/10/21. Pharmacy has been consulted for TPN in anticipation of post-op ileus.  Glucose / Insulin: no hx DM; CBGs <150 on BMET, no insulin ordered Electrolytes: Na slightly low at 134, K 3.7 (goal > 4 for ileus), Ca slightly low at 8.3, last Mg 8/27 low at 1.6 (goal > 2 for ileus) Renal: Scr 0.8, BUN 7 Hepatic: Labs 8/25: AST slightly low, all others WNL; pre-albumin low at 6 Intake / Output: 9/25 NG output, no UOP charted today, LBM 8/24 MIVF: LR 100/hr GI Imaging: 8/25 CTAP: large amount of inflammatory stranding in RLQ involving the terminal ileum, appendix, and cecum; 3.2 x 2.1 cm fluid collection adjacent to the cecum concerning for possible small residual or recurrent abscess GI Surgeries / Procedures: 8/27: ex lap w/ R colectomy and abscess drainage  Central access: pending TPN start date: 8/29  Nutritional Goals: Rph calculated goal TPN rate is 100 mL/hr (provides 115 g of protein and 2428 kcals per day)                RD Assessment:  Pending  Current Nutrition:  NPO TPN to start 8/29  Plan:  TPN to begin 8/29 PM as consult received near deadline to order TPN At 1800, change LR to D5 at 50 mL/hr (1/2 rate of  TPN; D5 instead of D10 to prevent further Na decline) Kcl IV x 3 runs for goal K > 4 F/u RD recommendations TPN labs ordered for 8/29 AM Initiate Moderate q4h SSI and adjust as needed   9/29, PharmD, BCPS 10/11/2021 11:54 AM

## 2021-10-11 NOTE — Progress Notes (Signed)
Peripherally Inserted Central Catheter Placement  The IV Nurse has discussed with the patient and/or persons authorized to consent for the patient, the purpose of this procedure and the potential benefits and risks involved with this procedure.  The benefits include less needle sticks, lab draws from the catheter, and the patient may be discharged home with the catheter. Risks include, but not limited to, infection, bleeding, blood clot (thrombus formation), and puncture of an artery; nerve damage and irregular heartbeat and possibility to perform a PICC exchange if needed/ordered by physician.  Alternatives to this procedure were also discussed.  Bard Power PICC patient education guide, fact sheet on infection prevention and patient information card has been provided to patient /or left at bedside.    PICC Placement Documentation  PICC Double Lumen 10/11/21 Right Basilic 44 cm 0 cm (Active)  Indication for Insertion or Continuance of Line Prolonged intravenous therapies 10/11/21 1446  Exposed Catheter (cm) 1 cm 10/11/21 1446  Site Assessment Clean, Dry, Intact 10/11/21 1446  Lumen #1 Status Blood return noted;Flushed;Saline locked 10/11/21 1446  Lumen #2 Status Blood return noted;Flushed;Saline locked 10/11/21 1446  Dressing Type Transparent;Securing device 10/11/21 1446  Dressing Status Antimicrobial disc in place 10/11/21 1446  Safety Lock Not Applicable 10/11/21 1446  Dressing Change Due 10/18/21 10/11/21 1446       Romie Jumper 10/11/2021, 2:53 PM

## 2021-10-11 NOTE — Progress Notes (Signed)
1 Day Post-Op  Subjective: Patient states his pain is well controlled today so far.  Wanting to get OOB and move around some.  No bowel function as expected.  NGT working well.  ROS: See above, otherwise other systems negative  Objective: Vital signs in last 24 hours: Temp:  [97.2 F (36.2 C)-98.5 F (36.9 C)] 98.5 F (36.9 C) (08/28 0751) Pulse Rate:  [94-100] 94 (08/28 0751) Resp:  [14-20] 16 (08/28 0751) BP: (112-132)/(69-90) 119/89 (08/28 0751) SpO2:  [91 %-100 %] 95 % (08/28 0751) Last BM Date : 10/07/21  Intake/Output from previous day: 08/27 0701 - 08/28 0700 In: 3255.7 [I.V.:3155.7; IV Piggyback:100] Out: 2525 [Urine:1775; Emesis/NG output:400; Drains:150; Blood:200] Intake/Output this shift: Total I/O In: 2027 [I.V.:2027] Out: 380 [Emesis/NG output:300; Drains:80]  PE: Gen: NAD Heart: regular Lungs: CTAB Abd: soft, appropriately tender, wound VAC in place, JP with serosang output and mostly full.  NGT in place with over 1/2 cannister of bilious output GU: foley with a lot of clear yellow urine  Lab Results:  Recent Labs    10/10/21 0056 10/10/21 0916 10/11/21 0155  WBC 14.5*  --  14.7*  HGB 12.5* 13.6 12.6*  HCT 35.2* 40.0 35.7*  PLT 235  --  270   BMET Recent Labs    10/10/21 0056 10/10/21 0916 10/11/21 0155  NA 133* 134* 134*  K 2.9* 3.4* 3.7  CL 98 96* 99  CO2 23  --  26  GLUCOSE 120* 123* 147*  BUN 5* 5* 7  CREATININE 1.01 0.80 0.86  CALCIUM 8.6*  --  8.3*   PT/INR No results for input(s): "LABPROT", "INR" in the last 72 hours. CMP     Component Value Date/Time   NA 134 (L) 10/11/2021 0155   NA 139 03/19/2018 1440   K 3.7 10/11/2021 0155   CL 99 10/11/2021 0155   CO2 26 10/11/2021 0155   GLUCOSE 147 (H) 10/11/2021 0155   BUN 7 10/11/2021 0155   BUN 13 03/19/2018 1440   CREATININE 0.86 10/11/2021 0155   CREATININE 1.01 02/17/2016 1017   CALCIUM 8.3 (L) 10/11/2021 0155   PROT 8.6 (H) 10/08/2021 1224   PROT 7.3 03/19/2018  1440   ALBUMIN 4.4 10/08/2021 1224   ALBUMIN 4.3 03/19/2018 1440   AST 12 (L) 10/08/2021 1224   ALT 17 10/08/2021 1224   ALKPHOS 59 10/08/2021 1224   BILITOT 0.8 10/08/2021 1224   BILITOT 0.4 03/19/2018 1440   GFRNONAA >60 10/11/2021 0155   GFRAA 97 03/19/2018 1440   Lipase     Component Value Date/Time   LIPASE 14 10/08/2021 1224       Studies/Results: [place] EKG SITE RITE  Result Date: 10/11/2021 If Site Rite image not attached, placement could not be confirmed due to current cardiac rhythm.   Anti-infectives: Anti-infectives (From admission, onward)    Start     Dose/Rate Route Frequency Ordered Stop   10/08/21 2315  piperacillin-tazobactam (ZOSYN) IVPB 3.375 g        3.375 g 12.5 mL/hr over 240 Minutes Intravenous Every 8 hours 10/08/21 2230     10/08/21 2245  piperacillin-tazobactam (ZOSYN) IVPB 3.375 g  Status:  Discontinued        3.375 g 100 mL/hr over 30 Minutes Intravenous Every 8 hours 10/08/21 2151 10/08/21 2229        Assessment/Plan POD 1, s/p ex lap with right colectomy and drainage of intraabdominal abscess and wound VAC placement -cont abx therapy, a lot of  intra-op contamination so would benefit likely from a week of abx -cont NGT and await bowel function.  Expected to have a post op ileus -insert picc/TNA -mobilize, pulm toilet -DC foley -repeat labs in am -cont JP drain for now  FEN - NPO/NGT/IVFs/TNA VTE - lovenox ID - zosyn    LOS: 3 days    Letha Cape , [facility] 10/11/2021, 11:53 AM Please see Amion for pager number during day hours 7:00am-4:30pm or 7:00am -11:30am on weekends

## 2021-10-11 NOTE — Progress Notes (Signed)
Mobility Specialist - Progress Note   10/11/21 1548  Mobility  Activity Ambulated with assistance in hallway  Level of Assistance Standby assist, set-up cues, supervision of patient - no hands on  Assistive Device Other (Comment) (IV Pole)  Distance Ambulated (ft) 80 ft  Activity Response Tolerated well  $Mobility charge 1 Mobility    Pt received in bed agreeable to mobility. Used BR independently prior to ambulation. Stated pain was "5/10" during session. Left in recliner w/ call bell and all needs met.   Paulla Dolly Mobility Specialist

## 2021-10-12 LAB — GLUCOSE, CAPILLARY
Glucose-Capillary: 105 mg/dL — ABNORMAL HIGH (ref 70–99)
Glucose-Capillary: 110 mg/dL — ABNORMAL HIGH (ref 70–99)
Glucose-Capillary: 115 mg/dL — ABNORMAL HIGH (ref 70–99)
Glucose-Capillary: 125 mg/dL — ABNORMAL HIGH (ref 70–99)
Glucose-Capillary: 127 mg/dL — ABNORMAL HIGH (ref 70–99)
Glucose-Capillary: 128 mg/dL — ABNORMAL HIGH (ref 70–99)
Glucose-Capillary: 133 mg/dL — ABNORMAL HIGH (ref 70–99)

## 2021-10-12 LAB — COMPREHENSIVE METABOLIC PANEL
ALT: 17 U/L (ref 0–44)
AST: 16 U/L (ref 15–41)
Albumin: 2.4 g/dL — ABNORMAL LOW (ref 3.5–5.0)
Alkaline Phosphatase: 63 U/L (ref 38–126)
Anion gap: 10 (ref 5–15)
BUN: 8 mg/dL (ref 6–20)
CO2: 26 mmol/L (ref 22–32)
Calcium: 8.2 mg/dL — ABNORMAL LOW (ref 8.9–10.3)
Chloride: 102 mmol/L (ref 98–111)
Creatinine, Ser: 0.89 mg/dL (ref 0.61–1.24)
GFR, Estimated: >60 mL/min (ref >60)
Glucose, Bld: 141 mg/dL — ABNORMAL HIGH (ref 70–99)
Potassium: 3.3 mmol/L — ABNORMAL LOW (ref 3.5–5.1)
Sodium: 138 mmol/L (ref 135–145)
Total Bilirubin: 0.5 mg/dL (ref 0.3–1.2)
Total Protein: 6.6 g/dL (ref 6.5–8.1)

## 2021-10-12 LAB — CBC
HCT: 33.9 % — ABNORMAL LOW (ref 39.0–52.0)
Hemoglobin: 11.6 g/dL — ABNORMAL LOW (ref 13.0–17.0)
MCH: 30.7 pg (ref 26.0–34.0)
MCHC: 34.2 g/dL (ref 30.0–36.0)
MCV: 89.7 fL (ref 80.0–100.0)
Platelets: 352 10*3/uL (ref 150–400)
RBC: 3.78 MIL/uL — ABNORMAL LOW (ref 4.22–5.81)
RDW: 13.6 % (ref 11.5–15.5)
WBC: 12.5 10*3/uL — ABNORMAL HIGH (ref 4.0–10.5)
nRBC: 0 % (ref 0.0–0.2)

## 2021-10-12 LAB — TRIGLYCERIDES: Triglycerides: 135 mg/dL (ref <150)

## 2021-10-12 LAB — MAGNESIUM: Magnesium: 2.2 mg/dL (ref 1.7–2.4)

## 2021-10-12 LAB — PHOSPHORUS: Phosphorus: 3.5 mg/dL (ref 2.5–4.6)

## 2021-10-12 MED ORDER — METHOCARBAMOL 1000 MG/10ML IJ SOLN
500.0000 mg | Freq: Four times a day (QID) | INTRAVENOUS | Status: DC
  Administered 2021-10-12 – 2021-10-13 (×4): 500 mg via INTRAVENOUS
  Filled 2021-10-12 (×2): qty 5
  Filled 2021-10-12: qty 500
  Filled 2021-10-12 (×3): qty 5
  Filled 2021-10-12: qty 500

## 2021-10-12 MED ORDER — HYDROMORPHONE HCL 1 MG/ML IJ SOLN
0.5000 mg | INTRAMUSCULAR | Status: DC | PRN
  Administered 2021-10-12 – 2021-10-18 (×20): 1 mg via INTRAVENOUS
  Filled 2021-10-12 (×22): qty 1

## 2021-10-12 MED ORDER — DIPHENHYDRAMINE HCL 50 MG/ML IJ SOLN
25.0000 mg | Freq: Every evening | INTRAMUSCULAR | Status: DC | PRN
  Administered 2021-10-12 – 2021-10-13 (×2): 25 mg via INTRAVENOUS
  Filled 2021-10-12 (×2): qty 1

## 2021-10-12 MED ORDER — HYDRALAZINE HCL 20 MG/ML IJ SOLN
5.0000 mg | Freq: Four times a day (QID) | INTRAMUSCULAR | Status: DC | PRN
Start: 2021-10-12 — End: 2021-10-19
  Administered 2021-10-14: 5 mg via INTRAVENOUS
  Filled 2021-10-12 (×2): qty 1

## 2021-10-12 MED ORDER — TRAVASOL 10 % IV SOLN
INTRAVENOUS | Status: AC
  Filled 2021-10-12: qty 480

## 2021-10-12 MED ORDER — POTASSIUM CHLORIDE 10 MEQ/50ML IV SOLN
10.0000 meq | INTRAVENOUS | Status: AC
  Administered 2021-10-12 (×3): 10 meq via INTRAVENOUS
  Filled 2021-10-12 (×3): qty 50

## 2021-10-12 MED ORDER — LACTATED RINGERS IV SOLN: INTRAVENOUS | Status: DC

## 2021-10-12 NOTE — TOC Initial Note (Signed)
Transition of Care [facility]) - Initial/Assessment Note    Patient Details  Name: Ryan Herman MRN: [id number] Date of Birth: 1981/01/23  Transition of Care [facility]) CM/SW Contact:    Kingsley Plan, RN Phone Number: 10/12/2021, 1:45 PM  Clinical Narrative:                  Spoke to patient at bedside. Confirmed face sheet information.   Patient from home with 41 year old daughter.   He has a girl friend who can assist if needed at discharge.   Currently patient has a VAC on. Explained if discharge with VAC , NCM will send application for home VAC to 48M they will submit to his insurance, if approved home VAC and supplies will be delivered to hospital room prior to discharge.  Hospital nurse would connect home VAC to wound dressing prior to discharge. Patient would take supplies home for [facility] to use to change dressing.   If patient discharged with wet to dry dressing, he and his girl friend would be taught wound care prior to discharge.   Patient voiced understanding.   Home VAC application placed on chart.   Once post discharge wound care determined TOC will start working to see if a home health agency can be secured.   Expected Discharge Plan: Home w Home Health Services Barriers to Discharge: Continued Medical Work up   Patient Goals and CMS Choice Patient states their goals for this hospitalization and ongoing recovery are:: to return to home CMS Medicare.gov Compare Post Acute Care list provided to:: Patient    Expected Discharge Plan and Services Expected Discharge Plan: Home w Home Health Services       Living arrangements for the past 2 months: Single Family Home                   DME Agency: NA                  Prior Living Arrangements/Services Living arrangements for the past 2 months: Single Family Home Lives with::  (34 year old daughter) Patient language and need for interpreter reviewed:: Yes Do you feel safe going back to the place where you  live?: Yes      Need for Family Participation in Patient Care: Yes (Comment) Care giver support system in place?: Yes (comment)      Activities of Daily Living Home Assistive Devices/Equipment: None ADL Screening (condition at time of admission) Patient's cognitive ability adequate to safely complete daily activities?: Yes Is the patient deaf or have difficulty hearing?: No Does the patient have difficulty seeing, even when wearing glasses/contacts?: No Does the patient have difficulty concentrating, remembering, or making decisions?: No Patient able to express need for assistance with ADLs?: Yes Does the patient have difficulty dressing or bathing?: No Independently performs ADLs?: Yes (appropriate for developmental age) Does the patient have difficulty walking or climbing stairs?: No Weakness of Legs: None Weakness of Arms/Hands: None  Permission Sought/Granted   Permission granted to share information with : No              Emotional Assessment Appearance:: Appears stated age Attitude/Demeanor/Rapport: Engaged Affect (typically observed): Accepting Orientation: : Oriented to Self, Oriented to Place, Oriented to  Time, Oriented to Situation Alcohol / Substance Use: Not Applicable Psych Involvement: No (comment)  Admission diagnosis:  Acute perforated appendicitis [K35.32] Right lower quadrant abdominal pain [R10.31] Ruptured appendicitis [K35.32] Patient Active Problem List   Diagnosis Date Noted  Ruptured appendicitis 09/04/2021   Acute perforated appendicitis 09/04/2021   Anxiety 08/06/2018   Acute stress reaction 05/09/2018   Caregiver burden 05/09/2018   Essential hypertension, benign 04/30/2018   Influenza vaccination declined 03/19/2018   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/24/2017   Pain in both feet 02/24/2017   OSA on CPAP 02/17/2016   Routine general medical examination at a health care facility 02/17/2016   Microscopic hematuria 02/17/2016   Encounter for health  maintenance examination in adult 02/10/2015   Insomnia 02/10/2015   Impaired fasting blood sugar 02/10/2015   Obesity 02/05/2014   PCP:  Filomena Jungling, NP Pharmacy:   [facility] [phone] - [place], [place] - [address] AT [facility] [address] [place] [address] Phone: [phone] Fax: [phone]  [facility] [address] [place] [address] Phone: [phone] Fax: [phone]  [facility] [address] [place] [address] Phone: [phone] Fax: [phone]     Social Determinants of Health (SDOH) Interventions    Readmission Risk Interventions     No data to display

## 2021-10-12 NOTE — Progress Notes (Signed)
[facility] Progress Note  2 Days Post-Op  Subjective: CC-  Did not sleep well last night. Somewhat from abdominal pain, somewhat from just being in the hospital. Used to using CPAP but cannot due to NG tube.  No flatus or BM. NG with 900cc bilious output. He did get OOB to ambulate yesterday.  Objective: Vital signs in last 24 hours: Temp:  [97.8 F (36.6 C)-98 F (36.7 C)] 98 F (36.7 C) (08/29 0416) Pulse Rate:  [96-110] 96 (08/29 0416) Resp:  [16-20] 16 (08/29 0416) BP: (119-138)/(88-91) 138/91 (08/29 0416) SpO2:  [95 %-97 %] 95 % (08/29 0416) Last BM Date : 10/07/21  Intake/Output from previous day: 08/28 0701 - 08/29 0700 In: 2027 [I.V.:2027] Out: 3820 [Urine:2800; Emesis/NG output:900; Drains:120] Intake/Output this shift: No intake/output data recorded.  PE: Gen: NAD Heart: RRR Lungs: CTAB Abd: soft, some distension, hypoactive BS, appropriately tender, wound VAC in place with good seal, JP with serosang output.   Lab Results:  Recent Labs    10/11/21 0155 10/12/21 0525  WBC 14.7* 12.5*  HGB 12.6* 11.6*  HCT 35.7* 33.9*  PLT 270 352   BMET Recent Labs    10/11/21 0155 10/12/21 0525  NA 134* 138  K 3.7 3.3*  CL 99 102  CO2 26 26  GLUCOSE 147* 141*  BUN 7 8  CREATININE 0.86 0.89  CALCIUM 8.3* 8.2*   PT/INR No results for input(s): "LABPROT", "INR" in the last 72 hours. CMP     Component Value Date/Time   NA 138 10/12/2021 0525   NA 139 03/19/2018 1440   K 3.3 (L) 10/12/2021 0525   CL 102 10/12/2021 0525   CO2 26 10/12/2021 0525   GLUCOSE 141 (H) 10/12/2021 0525   BUN 8 10/12/2021 0525   BUN 13 03/19/2018 1440   CREATININE 0.89 10/12/2021 0525   CREATININE 1.01 02/17/2016 1017   CALCIUM 8.2 (L) 10/12/2021 0525   PROT 6.6 10/12/2021 0525   PROT 7.3 03/19/2018 1440   ALBUMIN 2.4 (L) 10/12/2021 0525   ALBUMIN 4.3 03/19/2018 1440   AST 16 10/12/2021 0525   ALT 17 10/12/2021 0525   ALKPHOS 63 10/12/2021 0525   BILITOT 0.5  10/12/2021 0525   BILITOT 0.4 03/19/2018 1440   GFRNONAA >60 10/12/2021 0525   GFRAA 97 03/19/2018 1440   Lipase     Component Value Date/Time   LIPASE 14 10/08/2021 1224       Studies/Results: [place] EKG SITE RITE  Result Date: 10/11/2021 If Site Rite image not attached, placement could not be confirmed due to current cardiac rhythm.   Anti-infectives: Anti-infectives (From admission, onward)    Start     Dose/Rate Route Frequency Ordered Stop   10/08/21 2315  piperacillin-tazobactam (ZOSYN) IVPB 3.375 g        3.375 g 12.5 mL/hr over 240 Minutes Intravenous Every 8 hours 10/08/21 2230     10/08/21 2245  piperacillin-tazobactam (ZOSYN) IVPB 3.375 g  Status:  Discontinued        3.375 g 100 mL/hr over 30 Minutes Intravenous Every 8 hours 10/08/21 2151 10/08/21 2229        Assessment/Plan POD #2, s/p ex lap with right colectomy and drainage of intraabdominal abscess and wound VAC placement 8/27 Dr. Andrey Campanile - Surgical path pending -cont abx therapy, a lot of intra-op contamination so would benefit likely from a week of abx. WBC trending down 12.5, afebrile -cont NGT and await bowel function.  Expected to have a post op  ileus -wound vac - WOC consult for first vac change 8/30 -PICC placed yesterday, TPN to start today -mobilize, pulm toilet -cont JP drain for now and monitor output - serosanguinous - add scheduled robaxin to tylenol for improved pain control   FEN - NPO/NGT/IVFs/TNA VTE - lovenox ID - zosyn  ABL anemia - Hgb 11.6, monitor OSA HTN - IV PRNs while NPO Obesity BMI 36.3   LOS: 4 days    Franne Forts, [facility] 10/12/2021, 8:24 AM Please see Amion for pager number during day hours 7:00am-4:30pm

## 2021-10-12 NOTE — Progress Notes (Signed)
PHARMACY - TOTAL PARENTERAL NUTRITION CONSULT NOTE   Indication: Prolonged ileus, s/p ex lap w/ R colectomy, intra-abdominal abscess drainage 8/27  Patient Measurements: Height: 6\' 1"  (185.4 cm) Weight: 124.8 kg (275 lb 2.2 oz) IBW/kg (Calculated) : 79.9 TPN AdjBW (KG): 91.1 Body mass index is 36.3 kg/m.  Assessment: 41 yo male presented on 8/25 for abdominal pain. Recent history of admission for perforated appendicitis 7/26-7/30/23. IR placed CT-guided drainage catheter in RLQ abscess on 7/26 which was removed on 8/21 - noted mild RLQ abdominal pain since drain removal. CT on arrival this admit concerning for small residual or recurrent abscess not amendable to drainage and perforated appendicitis. Patient underwent ex lap w/ R colectomy and abscess drainage on 10/10/21. Pharmacy has been consulted for TPN in anticipation of post-op ileus. Patient is at risk for refeeding given prolonged NPO status.   Glucose / Insulin: no hx DM; CBGs range 105-141, 7 units of moderate SSI used/24 hours Electrolytes: K 3.3 (down from 3.7, s/p 3 K runs IV), CoCa 9.5, all others WNL - Goal K >4 and Mg > 2 for ileus Renal: Scr < 1 and BUN 8 - both stable Hepatic: LFTs WNL, Prealbumin on 8/28 low at 6; TG 135 Intake / Output:  - 120 mL drain output, 9/28 NG output, 2.8 L UOP, LBM 8/24 MIVF: D5 at 50 mL/hr GI Imaging: 8/25 CTAP: large amount of inflammatory stranding in RLQ involving the terminal ileum, appendix, and cecum; 3.2 x 2.1 cm fluid collection adjacent to the cecum concerning for possible small residual or recurrent abscess GI Surgeries / Procedures: 8/27: ex lap w/ R colectomy and abscess drainage  Central access: double-lumen PICC placed 8/28 TPN start date: 8/29  Nutritional Goals: Goal TPN rate is 100 mL/hr (provides 120 g of protein and 2424 kcals per day)               RD Assessment: Estimated Needs Total Energy Estimated Needs: 2400-2600 Total Protein Estimated Needs: 120-140  grams Total Fluid Estimated Needs: >/= 2.4 L  Current Nutrition:  NPO TPN to start 8/29  Now: - Kcl IV x 3 runs per MD  At 1800:  Start TPN at 12mL/hr at 1800 Electrolytes in TPN: Na 66mEq/L, K 46mEq/L, Ca 30mEq/L, Mg 45mEq/L, and Phos 81mmol/L. Cl:Ac 1:1 Add standard MVI and trace elements to TPN Initiate Moderate q4h SSI and adjust as needed  Reduce MIVF to KVO mL/hr at 1800 Monitor TPN labs on Mon/Thurs BMET, Mg, and Phos tomorrow while TPN being initiated   12m, PharmD, BCPS 10/12/2021 10:45 AM

## 2021-10-12 NOTE — Consult Note (Addendum)
WOC Nurse Consult Note: Consult requested to begin abd Vac dressing changes 8/30 to full thickness post-op wound.  WOC team will perform tomorrow with surgical PA present to assess wound appearance during the first post-op dressing change. Supplies ordered to the bedside. Thank-you,  Cammie Mcgee MSN, RN, CWOCN, [place], CNS [phone]

## 2021-10-12 NOTE — Progress Notes (Signed)
Initial Nutrition Assessment  DOCUMENTATION CODES:   Not applicable  INTERVENTION:   TPN management per pharmacy  NUTRITION DIAGNOSIS:   Increased nutrient needs related to post-op healing as evidenced by estimated needs.  GOAL:   Patient will meet greater than or equal to 90% of their needs  MONITOR:   Diet advancement, Labs, I & O's, Weight trends  REASON FOR ASSESSMENT:   Consult New TPN/TNA  ASSESSMENT:   41 y.o. male presented to the [facility] with RLQ pain and fevers. Pt admitted 7/26-7/30 with a perforated appendicitis; RLQ drain removed 8/21 in office. PMH include HTN.  Pt admitted with residual fluid collection and infection in RLQ after drain removal.   8/27 - ex-lap s/p R colectomy, drainage of intraabdominal abscess, and application of wound VAC; NG tube placed  8/28 - PICC placed   Pt resting in bed. Reports that his appetite was great PTA and prior to previous admission. States that he has eating what foods he liked, such as fruits, vegetables, and tacos. Reports that he has not been able to tolerate ice chips as well today; was having some nausea.   Pt reports that his UBW is around 270# and that he lost down to ~251# during his last admission. Current weight is the same weight has last admission and no additional weights recorded within the past year to assess.   RD explained the use of TPN to meet pt needs until able to tolerate PO diet much better. Pt with no other questions or concerns at this time.   Medications reviewed and include: NovoLog, IV antibiotics, IV Potassium Chloride  Labs reviewed: Potassium 3.3, 24 hr CBG 105-132  NGT output: 900 mL x 24 hours  NUTRITION - FOCUSED PHYSICAL EXAM:  Flowsheet Row Most Recent Value  Orbital Region No depletion  Upper Arm Region No depletion  Thoracic and Lumbar Region No depletion  Buccal Region No depletion  Temple Region No depletion  Clavicle Bone Region No depletion  Clavicle and Acromion Bone Region  No depletion  Scapular Bone Region No depletion  Dorsal Hand No depletion  Patellar Region No depletion  Anterior Thigh Region No depletion  Posterior Calf Region No depletion   Diet Order:   Diet Order             Diet NPO time specified Except for: Ice Chips  Diet effective midnight                   EDUCATION NEEDS:   No education needs have been identified at this time  Skin:  Skin Assessment: Skin Integrity Issues: Skin Integrity Issues:: Wound VAC Wound Vac: Abdomen  Last BM:  8/24  Height:   Ht Readings from Last 1 Encounters:  10/08/21 6\' 1"  (1.854 m)    Weight:   Wt Readings from Last 1 Encounters:  10/08/21 124.8 kg    Ideal Body Weight:  83.6 kg  BMI:  Body mass index is 36.3 kg/m.  Estimated Nutritional Needs:   Kcal:  2400-2600  Protein:  120-140 grams  Fluid:  >/= 2.4 L    10/10/21 RD, LDN Clinical Dietitian See [facility] for contact information.

## 2021-10-12 NOTE — Progress Notes (Signed)
Mobility Specialist - Progress Note   10/12/21 1422  Mobility  Activity Refused mobility      Pt refused mobility d/t abdominal wound being stapled, pain and fatigue. Will follow up as time allows.   Lewie Loron Mobility Specialist

## 2021-10-13 LAB — SURGICAL PATHOLOGY

## 2021-10-13 LAB — GLUCOSE, CAPILLARY
Glucose-Capillary: 119 mg/dL — ABNORMAL HIGH (ref 70–99)
Glucose-Capillary: 122 mg/dL — ABNORMAL HIGH (ref 70–99)
Glucose-Capillary: 128 mg/dL — ABNORMAL HIGH (ref 70–99)
Glucose-Capillary: 141 mg/dL — ABNORMAL HIGH (ref 70–99)
Glucose-Capillary: 150 mg/dL — ABNORMAL HIGH (ref 70–99)
Glucose-Capillary: 150 mg/dL — ABNORMAL HIGH (ref 70–99)

## 2021-10-13 LAB — AEROBIC/ANAEROBIC CULTURE W GRAM STAIN (SURGICAL/DEEP WOUND)

## 2021-10-13 LAB — CULTURE, BLOOD (ROUTINE X 2)
Culture: NO GROWTH
Culture: NO GROWTH
Special Requests: ADEQUATE
Special Requests: ADEQUATE

## 2021-10-13 LAB — CBC
HCT: 36.5 % — ABNORMAL LOW (ref 39.0–52.0)
Hemoglobin: 12.4 g/dL — ABNORMAL LOW (ref 13.0–17.0)
MCH: 30.7 pg (ref 26.0–34.0)
MCHC: 34 g/dL (ref 30.0–36.0)
MCV: 90.3 fL (ref 80.0–100.0)
Platelets: 456 10*3/uL — ABNORMAL HIGH (ref 150–400)
RBC: 4.04 MIL/uL — ABNORMAL LOW (ref 4.22–5.81)
RDW: 13.8 % (ref 11.5–15.5)
WBC: 12.6 10*3/uL — ABNORMAL HIGH (ref 4.0–10.5)
nRBC: 0 % (ref 0.0–0.2)

## 2021-10-13 LAB — BASIC METABOLIC PANEL
Anion gap: 14 (ref 5–15)
BUN: 7 mg/dL (ref 6–20)
CO2: 24 mmol/L (ref 22–32)
Calcium: 9 mg/dL (ref 8.9–10.3)
Chloride: 101 mmol/L (ref 98–111)
Creatinine, Ser: 0.8 mg/dL (ref 0.61–1.24)
GFR, Estimated: >60 mL/min (ref >60)
Glucose, Bld: 148 mg/dL — ABNORMAL HIGH (ref 70–99)
Potassium: 3.5 mmol/L (ref 3.5–5.1)
Sodium: 139 mmol/L (ref 135–145)

## 2021-10-13 LAB — MAGNESIUM: Magnesium: 2.4 mg/dL (ref 1.7–2.4)

## 2021-10-13 LAB — PHOSPHORUS: Phosphorus: 4.6 mg/dL (ref 2.5–4.6)

## 2021-10-13 MED ORDER — ACETAMINOPHEN 10 MG/ML IV SOLN
1000.0000 mg | Freq: Four times a day (QID) | INTRAVENOUS | Status: AC
Start: 2021-10-13 — End: 2021-10-14
  Administered 2021-10-13 – 2021-10-14 (×4): 1000 mg via INTRAVENOUS
  Filled 2021-10-13 (×4): qty 100

## 2021-10-13 MED ORDER — METHOCARBAMOL 1000 MG/10ML IJ SOLN
1000.0000 mg | Freq: Four times a day (QID) | INTRAVENOUS | Status: DC
  Administered 2021-10-13 – 2021-10-18 (×19): 1000 mg via INTRAVENOUS
  Filled 2021-10-13 (×13): qty 10
  Filled 2021-10-13: qty 1000
  Filled 2021-10-13 (×3): qty 10
  Filled 2021-10-13: qty 1000
  Filled 2021-10-13 (×5): qty 10

## 2021-10-13 MED ORDER — TRAVASOL 10 % IV SOLN
INTRAVENOUS | Status: AC
  Filled 2021-10-13: qty 1200

## 2021-10-13 MED ORDER — SILVER NITRATE-POT NITRATE 75-25 % EX MISC
1.0000 | Freq: Once | CUTANEOUS | Status: AC
Start: 2021-10-13 — End: 2021-10-13
  Administered 2021-10-13: 1 via TOPICAL
  Filled 2021-10-13: qty 1

## 2021-10-13 NOTE — Progress Notes (Addendum)
3 Days Post-Op  Subjective: CC: Reports his abdomen feels tight. Had some nausea and hiccups yesterday. NGT bilious, 500cc in cannister. Did start passing some flatus yesterday. No BM. Voiding. Only oob to void yesterday, reports restricted by NGT.   Objective: Vital signs in last 24 hours: Temp:  [98.2 F (36.8 C)-99.6 F (37.6 C)] 98.5 F (36.9 C) (08/30 0718) Pulse Rate:  [88-109] 108 (08/30 0718) Resp:  [16-17] 16 (08/30 0718) BP: (129-143)/(77-97) 134/77 (08/30 0718) SpO2:  [96 %-98 %] 96 % (08/30 0718) Last BM Date : 10/07/21  Intake/Output from previous day: 08/29 0701 - 08/30 0700 In: -  Out: 2590 [Urine:1150; Emesis/NG output:1400; Drains:40] Intake/Output this shift: No intake/output data recorded.  PE: Gen: NAD Heart: Reg Lungs: CTA b/l, normal rate and effort  Abd: soft, some distension, hypoactive BS, appropriately tender, wound VAC in place with good seal and scant bloody/SS output in cannister. JP with serosang output (40cc/24 hours). NGT with bilious output.   Wound vac changed with WOCN. See picture below. Wound is clean with healthy granulation tissue/beefy red tissue at the base. No false bottom noted. No evidence of dehiscence. There were 3 areas of oozing after vac removal noted on the left side of wound along the skin edge at the superior, medial and inferior portion of the wound. The medial and inferior areas of oozing stopped with pressure. The superior area of was controlled after silver nitrate application.      Lab Results:  Recent Labs    10/12/21 0525 10/13/21 0556  WBC 12.5* 12.6*  HGB 11.6* 12.4*  HCT 33.9* 36.5*  PLT 352 456*   BMET Recent Labs    10/12/21 0525 10/13/21 0556  NA 138 139  K 3.3* 3.5  CL 102 101  CO2 26 24  GLUCOSE 141* 148*  BUN 8 7  CREATININE 0.89 0.80  CALCIUM 8.2* 9.0   PT/INR No results for input(s): "LABPROT", "INR" in the last 72 hours. CMP     Component Value Date/Time   NA 139 10/13/2021  0556   NA 139 03/19/2018 1440   K 3.5 10/13/2021 0556   CL 101 10/13/2021 0556   CO2 24 10/13/2021 0556   GLUCOSE 148 (H) 10/13/2021 0556   BUN 7 10/13/2021 0556   BUN 13 03/19/2018 1440   CREATININE 0.80 10/13/2021 0556   CREATININE 1.01 02/17/2016 1017   CALCIUM 9.0 10/13/2021 0556   PROT 6.6 10/12/2021 0525   PROT 7.3 03/19/2018 1440   ALBUMIN 2.4 (L) 10/12/2021 0525   ALBUMIN 4.3 03/19/2018 1440   AST 16 10/12/2021 0525   ALT 17 10/12/2021 0525   ALKPHOS 63 10/12/2021 0525   BILITOT 0.5 10/12/2021 0525   BILITOT 0.4 03/19/2018 1440   GFRNONAA >60 10/13/2021 0556   GFRAA 97 03/19/2018 1440   Lipase     Component Value Date/Time   LIPASE 14 10/08/2021 1224    Studies/Results: [place] EKG SITE RITE  Result Date: 10/11/2021 If Site Rite image not attached, placement could not be confirmed due to current cardiac rhythm.   Anti-infectives: Anti-infectives (From admission, onward)    Start     Dose/Rate Route Frequency Ordered Stop   10/08/21 2315  piperacillin-tazobactam (ZOSYN) IVPB 3.375 g        3.375 g 12.5 mL/hr over 240 Minutes Intravenous Every 8 hours 10/08/21 2230     10/08/21 2245  piperacillin-tazobactam (ZOSYN) IVPB 3.375 g  Status:  Discontinued  3.375 g 100 mL/hr over 30 Minutes Intravenous Every 8 hours 10/08/21 2151 10/08/21 2229        Assessment/Plan POD #3, s/p ex lap with right colectomy and drainage of intraabdominal abscess and wound VAC placement 8/27 Dr. Andrey Campanile - Surgical path pending - Cont IV abx therapy, a lot of intra-op contamination so would benefit likely from a week of abx. Intra-op cx's with Strep C, Bacteroides Fragilis, beta lactamase neg - await sensitives.  - Cont NGT and await bowel function.  Expected to have a post op ileus.  - Cont TPN  - Cont JP, currently SS - Wound vac - M/W/F. - Mobilize, okay to clamp NGT for mobilization.  - Pulm toilet - Renew scheduled IV Tylenol and increase scheduled IV Robaxin to assist with  pain control. PRN Dilaudid.    FEN - NPO/NGT/IVFs/TNA VTE - lovenox ID - zosyn 8/25 >> WBC stable at 12.6. Afebrile. Tachycardic in 100's this am. No hypotension.    ABL anemia - Hgb uptrending at 12.4 OSA HTN - IV PRNs while NPO Obesity BMI 36.3   LOS: 5 days    Ryan Herman , [facility] Surgery 10/13/2021, 7:54 AM Please see Amion for pager number during day hours 7:00am-4:30pm

## 2021-10-13 NOTE — Consult Note (Addendum)
WOC Nurse Consult Note: Reason for Consult: Vac change performed with surgical PA: M. Maczis at the bedside to assess wound appearance. Refer to his surgical progress notes for further information. Midline abd with full thickness post-op wound, 100% beefy red, mod amt pink drainage in the cannister. 19X4.5X2cm.  Small amt bleeding began to occur at the upper edge at 1:00 o'clock when the dressing was removed.  Pressure was held for an extended period of time and PA eventually applied silver nitrate which resolved the bleeding. Sutures vissible to inner wound, small amt clotted blood to inner lower wound bed. Pt was medicated for pain prior to the procedire and tolerated with minimal amt discomfort.   Dressing procedure/placement/frequency: Applied one piece black foam to cont suction.    The procedure for this Vac change is very straightforward; orders placed for bedside nurses to perform the changes Q M/W/F.  Supplies at the bedside for staff nurse use.  Please refer to the surgical team for further questions regarding plan of care. Please re-consult if further assistance is needed.  Thank-you,  Cammie Mcgee MSN, RN, CWOCN, [place], CNS [phone]

## 2021-10-13 NOTE — Progress Notes (Signed)
PHARMACY - TOTAL PARENTERAL NUTRITION CONSULT NOTE   Indication: Prolonged ileus, s/p ex lap w/ R colectomy, intra-abdominal abscess drainage 8/27  Patient Measurements: Height: 6\' 1"  (185.4 cm) Weight: 124.8 kg (275 lb 2.2 oz) IBW/kg (Calculated) : 79.9 TPN AdjBW (KG): 91.1 Body mass index is 36.3 kg/m.  Assessment: 41 yo male presented on 8/25 for abdominal pain. Recent history of admission for perforated appendicitis 7/26-7/30/23. IR placed CT-guided drainage catheter in RLQ abscess on 7/26 which was removed on 8/21 - noted mild RLQ abdominal pain since drain removal. CT on arrival this admit concerning for small residual or recurrent abscess not amendable to drainage and perforated appendicitis. Patient underwent ex lap w/ R colectomy and abscess drainage on 10/10/21. Pharmacy has been consulted for TPN in anticipation of post-op ileus. Patient is at risk for refeeding given prolonged NPO status.   Glucose / Insulin: no hx DM; CBGs range 127-150, 6 units of moderate SSI used/24 hours Electrolytes: K 3.5 (s/p Kcl 30 mEq yesterday), CoCa 10.3, all others WNL - Goal K >4 and Mg > 2 for ileus Renal: Scr < 1 and BUN 7 - both stable Hepatic: LFTs WNL, Prealbumin on 8/28 low at 6; TG 135 Intake / Output: 40 mL drain output, 9/28 NG output, 0.4 mL/kg/hr UOP, LBM 8/24 MIVF: LR KVO mL/hr GI Imaging: 8/25 CTAP: large amount of inflammatory stranding in RLQ involving the terminal ileum, appendix, and cecum; 3.2 x 2.1 cm fluid collection adjacent to the cecum concerning for possible small residual or recurrent abscess GI Surgeries / Procedures: 8/27: ex lap w/ R colectomy and abscess drainage  Central access: double-lumen PICC placed 8/28 TPN start date: 8/29  Nutritional Goals: Goal TPN rate is 100 mL/hr (provides 120 g of protein and 2424 kcals per day)            RD Assessment: Estimated Needs Total Energy Estimated Needs: 2400-2600 Total Protein Estimated Needs: 120-140 grams Total Fluid  Estimated Needs: >/= 2.4 L  Current Nutrition:  NPO  Plan:  Increase TPN to 142mL/hr at 1800 Electrolytes in TPN: Na 73mEq/L, K 58mEq/L, Ca 46mEq/L, Mg 61mEq/L, and Phos 35mmol/L. Cl:Ac 1:1 Add standard MVI and trace elements to TPN Continue Moderate q4h SSI and adjust as needed Monitor TPN labs on Mon/Thurs  12m, PharmD Clinical Pharmacist

## 2021-10-13 NOTE — Progress Notes (Signed)
Mobility Specialist - Progress Note   10/13/21 1249  Mobility  Activity Ambulated with assistance in hallway  Level of Assistance Standby assist, set-up cues, supervision of patient - no hands on  Assistive Device Other (Comment) (IV Pole)  Distance Ambulated (ft) 200 ft  Activity Response Tolerated well  $Mobility charge 1 Mobility    Pt received in bed and agreeable to mobility. Tolerated very well, no complaints. Left in bed w/ call bell and all needs met.   Paulla Dolly Mobility Specialist

## 2021-10-14 LAB — COMPREHENSIVE METABOLIC PANEL
ALT: 36 U/L (ref 0–44)
AST: 39 U/L (ref 15–41)
Albumin: 2.4 g/dL — ABNORMAL LOW (ref 3.5–5.0)
Alkaline Phosphatase: 65 U/L (ref 38–126)
Anion gap: 9 (ref 5–15)
BUN: 9 mg/dL (ref 6–20)
CO2: 24 mmol/L (ref 22–32)
Calcium: 8.7 mg/dL — ABNORMAL LOW (ref 8.9–10.3)
Chloride: 105 mmol/L (ref 98–111)
Creatinine, Ser: 0.78 mg/dL (ref 0.61–1.24)
GFR, Estimated: >60 mL/min (ref >60)
Glucose, Bld: 145 mg/dL — ABNORMAL HIGH (ref 70–99)
Potassium: 3.4 mmol/L — ABNORMAL LOW (ref 3.5–5.1)
Sodium: 138 mmol/L (ref 135–145)
Total Bilirubin: 0.5 mg/dL (ref 0.3–1.2)
Total Protein: 7 g/dL (ref 6.5–8.1)

## 2021-10-14 LAB — GLUCOSE, CAPILLARY
Glucose-Capillary: 146 mg/dL — ABNORMAL HIGH (ref 70–99)
Glucose-Capillary: 145 mg/dL — ABNORMAL HIGH (ref 70–99)
Glucose-Capillary: 131 mg/dL — ABNORMAL HIGH (ref 70–99)
Glucose-Capillary: 128 mg/dL — ABNORMAL HIGH (ref 70–99)

## 2021-10-14 LAB — PHOSPHORUS: Phosphorus: 4.2 mg/dL (ref 2.5–4.6)

## 2021-10-14 LAB — MAGNESIUM: Magnesium: 2.4 mg/dL (ref 1.7–2.4)

## 2021-10-14 LAB — TRIGLYCERIDES: Triglycerides: 100 mg/dL (ref <150)

## 2021-10-14 MED ORDER — METOPROLOL TARTRATE 5 MG/5ML IV SOLN
5.0000 mg | Freq: Three times a day (TID) | INTRAVENOUS | Status: DC | PRN
Start: 2021-10-14 — End: 2021-10-19
  Administered 2021-10-17: 5 mg via INTRAVENOUS
  Filled 2021-10-14: qty 5

## 2021-10-14 MED ORDER — ACETAMINOPHEN 10 MG/ML IV SOLN
1000.0000 mg | Freq: Four times a day (QID) | INTRAVENOUS | Status: AC
  Administered 2021-10-14 – 2021-10-15 (×4): 1000 mg via INTRAVENOUS
  Filled 2021-10-14 (×4): qty 100

## 2021-10-14 MED ORDER — MENTHOL 3 MG MT LOZG
1.0000 | LOZENGE | OROMUCOSAL | Status: DC | PRN
  Administered 2021-10-14 – 2021-10-16 (×2): 3 mg via ORAL
  Filled 2021-10-14 (×2): qty 9

## 2021-10-14 MED ORDER — TRAVASOL 10 % IV SOLN
INTRAVENOUS | Status: AC
  Filled 2021-10-14: qty 1200

## 2021-10-14 MED ORDER — POTASSIUM CHLORIDE 10 MEQ/100ML IV SOLN
10.0000 meq | INTRAVENOUS | Status: AC
  Administered 2021-10-14 (×2): 10 meq via INTRAVENOUS
  Filled 2021-10-14 (×2): qty 100

## 2021-10-14 NOTE — Progress Notes (Signed)
PHARMACY - TOTAL PARENTERAL NUTRITION CONSULT NOTE   Indication: Prolonged ileus, s/p ex lap w/ R colectomy, intra-abdominal abscess drainage 8/27  Patient Measurements: Height: 6\' 1"  (185.4 cm) Weight: 124.8 kg (275 lb 2.2 oz) IBW/kg (Calculated) : 79.9 TPN AdjBW (KG): 91.1 Body mass index is 36.3 kg/m.  Assessment: 41 yo male presented on 8/25 for abdominal pain. Recent history of admission for perforated appendicitis 7/26-7/30/23. IR placed CT-guided drainage catheter in RLQ abscess on 7/26 which was removed on 8/21 - noted mild RLQ abdominal pain since drain removal. CT on arrival this admit concerning for small residual or recurrent abscess not amendable to drainage and perforated appendicitis. Patient underwent ex lap w/ R colectomy and abscess drainage on 10/10/21. Pharmacy has been consulted for TPN in anticipation of post-op ileus. Patient is at risk for refeeding given prolonged NPO status.   Glucose / Insulin: no hx DM; CBGs range 120-150, 10 units of moderate SSI used/24 hours Electrolytes: K 3.4, CoCa 10, all others WNL - Goal K >4 and Mg > 2 for ileus Renal: Scr < 1 and BUN 9 - both stable Hepatic: LFTs WNL, Prealbumin on 8/28 low at 6; TG 100 Intake / Output: 24mL drain output, 12m NG output, 0.2 mL/kg/hr UOP, LBM 8/24 MIVF: LR KVO mL/hr GI Imaging: 8/25 CTAP: large amount of inflammatory stranding in RLQ involving the terminal ileum, appendix, and cecum; 3.2 x 2.1 cm fluid collection adjacent to the cecum concerning for possible small residual or recurrent abscess GI Surgeries / Procedures: 8/27: ex lap w/ R colectomy and abscess drainage  Central access: double-lumen PICC placed 8/28 TPN start date: 8/29  Nutritional Goals: Goal TPN rate is 100 mL/hr (provides 120 g of protein and 2424 kcals per day)            RD Assessment: Estimated Needs Total Energy Estimated Needs: 2400-2600 Total Protein Estimated Needs: 120-140 grams Total Fluid Estimated Needs: >/= 2.4  L  Current Nutrition:  NPO  Plan:  Continue TPN at 118mL/hr at 1800 Electrolytes in TPN: Na 91mEq/L, K 33mEq/L, Ca 14mEq/L, Mg 19mEq/L, and Phos 20mmol/L. Cl:Ac 1:1 Give Kcl 10 mEq IV x 2  Add standard MVI and trace elements to TPN Continue Moderate q4h SSI and adjust as needed Monitor TPN labs on Mon/Thurs BMP, Phos, Mg tomorrow  12m, PharmD Clinical Pharmacist

## 2021-10-14 NOTE — Plan of Care (Signed)
  Problem: Education: Goal: Knowledge of General Education information will improve Description Including pain rating scale, medication(s)/side effects and non-pharmacologic comfort measures Outcome: Progressing   Problem: Health Behavior/Discharge Planning: Goal: Ability to manage health-related needs will improve Outcome: Progressing   

## 2021-10-14 NOTE — Progress Notes (Signed)
4 Days Post-Op  Subjective: CC: Feeling better. Abdomen feels less tight. Nausea resolved. Passing flatus, 2-3x yesterday. No bm. Intermittent pain in his lower abdomen that last 1-2 seconds and will come on 2-3x/hour. Pain well controlled currently. Used IV dilaudid x 5 yesterday and x 2 since midnight. Reports he did not sleep well 2/2 sore throat/discomfort from NGT and inability to use CPAP as he normally does. Mobilized yesterday with mobility tech. Voiding.   Objective: Vital signs in last 24 hours: Temp:  [97.7 F (36.5 C)-98.8 F (37.1 C)] 98.8 F (37.1 C) (08/31 0507) Pulse Rate:  [95-109] 109 (08/31 0507) Resp:  [16] 16 (08/30 1934) BP: (126-148)/(95-101) 148/99 (08/31 0507) SpO2:  [97 %-100 %] 97 % (08/31 0507) Last BM Date : 10/07/21  Intake/Output from previous day: 08/30 0701 - 08/31 0700 In: 882.6 [I.V.:882.6] Out: 1815 [Urine:700; Emesis/NG output:1100; Drains:15] Intake/Output this shift: No intake/output data recorded.  PE: Gen: NAD Heart: tachycardic with regular rhythm  Lungs: CTA b/l, normal rate and effort  Abd: Distension improved and abdomen is much soft, more active bowel sounds, appropriately tender around midline wound and drain but otherwise NT, wound VAC in place with good seal and scant bloody/SS output in cannister. JP with serosang output (15cc/24 hours). NGT with bilious output. 1100cc/24 hours.   Lab Results:  Recent Labs    10/12/21 0525 10/13/21 0556  WBC 12.5* 12.6*  HGB 11.6* 12.4*  HCT 33.9* 36.5*  PLT 352 456*   BMET Recent Labs    10/13/21 0556 10/14/21 0315  NA 139 138  K 3.5 3.4*  CL 101 105  CO2 24 24  GLUCOSE 148* 145*  BUN 7 9  CREATININE 0.80 0.78  CALCIUM 9.0 8.7*   PT/INR No results for input(s): "LABPROT", "INR" in the last 72 hours. CMP     Component Value Date/Time   NA 138 10/14/2021 0315   NA 139 03/19/2018 1440   K 3.4 (L) 10/14/2021 0315   CL 105 10/14/2021 0315   CO2 24 10/14/2021 0315    GLUCOSE 145 (H) 10/14/2021 0315   BUN 9 10/14/2021 0315   BUN 13 03/19/2018 1440   CREATININE 0.78 10/14/2021 0315   CREATININE 1.01 02/17/2016 1017   CALCIUM 8.7 (L) 10/14/2021 0315   PROT 7.0 10/14/2021 0315   PROT 7.3 03/19/2018 1440   ALBUMIN 2.4 (L) 10/14/2021 0315   ALBUMIN 4.3 03/19/2018 1440   AST 39 10/14/2021 0315   ALT 36 10/14/2021 0315   ALKPHOS 65 10/14/2021 0315   BILITOT 0.5 10/14/2021 0315   BILITOT 0.4 03/19/2018 1440   GFRNONAA >60 10/14/2021 0315   GFRAA 97 03/19/2018 1440   Lipase     Component Value Date/Time   LIPASE 14 10/08/2021 1224    Studies/Results: No results found.  Anti-infectives: Anti-infectives (From admission, onward)    Start     Dose/Rate Route Frequency Ordered Stop   10/08/21 2315  piperacillin-tazobactam (ZOSYN) IVPB 3.375 g        3.375 g 12.5 mL/hr over 240 Minutes Intravenous Every 8 hours 10/08/21 2230     10/08/21 2245  piperacillin-tazobactam (ZOSYN) IVPB 3.375 g  Status:  Discontinued        3.375 g 100 mL/hr over 30 Minutes Intravenous Every 8 hours 10/08/21 2151 10/08/21 2229      Path  FINAL MICROSCOPIC DIAGNOSIS:  A.   COLON, TERMINAL ILEUM AND RIGHT, PARTIAL COLECTOMY:  -    Cecal diverticulum with abscess; and acute  and chronic inflammation  and fibrosis of adjacent bowel wall.  -    Exuberant periappendiceal reaction with hemorrhage, chronic  inflammation and foreign body granulomas.  -    No acute appendicitis or appendiceal malignancy identified.  -    Proximal and distal mucosal margins viable.  -    No adenomatous change or malignancy identified.   Assessment/Plan POD #4, s/p ex lap with right colectomy and drainage of intraabdominal abscess and wound VAC placement 8/27 Dr. Andrey Campanile - Surgical path resulted as above. Will also review with MD. No malignancy identified.  - Cont IV abx therapy, a lot of intra-op contamination so would benefit likely from a week of abx. Intra-op cx's with Strep C, Bacteroides  Fragilis, beta lactamase neg, and abundant Eikenella Corrodens. No sensitivities on report. Discussed with Pharm who will look into options for narrowing   - Clamp NGT and check residuals this PM  - Cont TPN  - Cont JP, currently SS - Wound vac - M/W/F. - Mobilize, okay to clamp NGT for mobilization.  - Pulm toilet - Scheduled IV Tylenol and IV Robaxin to assist with pain control. PRN Dilaudid.    FEN - Clamp NGT, IVFs, TNA (K 3.4) VTE - SCDs, Lovenox ID - zosyn 8/25 >> WBC stable at 12.6 on last check. Repeat labs in AM. Afebrile. Tachycardic in 100's this am. No hypotension.    ABL anemia - Hgb uptrending at 12.4 on last check.  OSA - CPAP HTN - Hypertensive this am. Change parameters for IV PRNs while NPO. Messaged RN.  Obesity BMI 36.3   LOS: 6 days    Jacinto Halim , [facility] Surgery 10/14/2021, 8:33 AM Please see Amion for pager number during day hours 7:00am-4:30pm

## 2021-10-14 NOTE — Progress Notes (Signed)
Pt NG tube was clamped off per order at 0930 pt was taking sips of apple juice slowly and was tolerating fine. Patient told to to let RN know if he was getting nauseous or any abdominal pain.Patient called RN back in the room at 1244 and asked to be hooked back up because he was getting nauseous and was having pain.

## 2021-10-14 NOTE — TOC Progression Note (Addendum)
Transition of Care [facility]) - Progression Note    Patient Details  Name: Ryan Herman MRN: [id number] Date of Birth: 02-Apr-1980  [facility] CM/SW Contact  Nadene Rubins Adria Devon, RN Phone Number: 10/14/2021, 11:13 AM  Clinical Narrative:    At discharge plan for home VAC.   Eber Jones with [facility] cannot accept.   [facility] with [facility] accepted with start of care next week.   VAC form in chart for PA signature   1310 Home VAC application signed and faxed to [facility] with [facility]M   Expected Discharge Plan: Home w Home Health Services Barriers to Discharge: Continued Medical Work up  Expected Discharge Plan and Services Expected Discharge Plan: Home w Home Health Services       Living arrangements for the past 2 months: Single Family Home                   DME Agency: NA                   Social Determinants of Health (SDOH) Interventions    Readmission Risk Interventions     No data to display

## 2021-10-14 NOTE — Progress Notes (Signed)
Mobility Specialist - Progress Note   10/14/21 1227  Mobility  Activity Ambulated with assistance in hallway  Level of Assistance Standby assist, set-up cues, supervision of patient - no hands on  Assistive Device Other (Comment) (IV Pole)  Distance Ambulated (ft) 300 ft  Activity Response Tolerated well  $Mobility charge 1 Mobility   Pt received in bed agreeable to mobility. Tolerated very well, stated that his pain has decreased significantly throughout the past two days. Left EOB w/ call bell in reach and all needs met.   Paulla Dolly Mobility Specialist

## 2021-10-14 NOTE — Progress Notes (Signed)
Pt continues with some restlessness throughout the night, brief periods of rest. Pt has requested dilaudid around the clock for abdominal pain, partial effects. Pt educated also on increased activity and mobility to assist with regaining function of bowels and decreasing pain and stiffness while lying in bed.

## 2021-10-15 LAB — CBC
HCT: 36.7 % — ABNORMAL LOW (ref 39.0–52.0)
Hemoglobin: 12.2 g/dL — ABNORMAL LOW (ref 13.0–17.0)
MCH: 30.2 pg (ref 26.0–34.0)
MCHC: 33.2 g/dL (ref 30.0–36.0)
MCV: 90.8 fL (ref 80.0–100.0)
Platelets: 535 10*3/uL — ABNORMAL HIGH (ref 150–400)
RBC: 4.04 MIL/uL — ABNORMAL LOW (ref 4.22–5.81)
RDW: 14.1 % (ref 11.5–15.5)
WBC: 10.7 10*3/uL — ABNORMAL HIGH (ref 4.0–10.5)
nRBC: 0 % (ref 0.0–0.2)

## 2021-10-15 LAB — GLUCOSE, CAPILLARY
Glucose-Capillary: 138 mg/dL — ABNORMAL HIGH (ref 70–99)
Glucose-Capillary: 139 mg/dL — ABNORMAL HIGH (ref 70–99)
Glucose-Capillary: 148 mg/dL — ABNORMAL HIGH (ref 70–99)
Glucose-Capillary: 149 mg/dL — ABNORMAL HIGH (ref 70–99)
Glucose-Capillary: 152 mg/dL — ABNORMAL HIGH (ref 70–99)
Glucose-Capillary: 156 mg/dL — ABNORMAL HIGH (ref 70–99)
Glucose-Capillary: 157 mg/dL — ABNORMAL HIGH (ref 70–99)

## 2021-10-15 LAB — PHOSPHORUS: Phosphorus: 4.4 mg/dL (ref 2.5–4.6)

## 2021-10-15 LAB — BASIC METABOLIC PANEL
Anion gap: 10 (ref 5–15)
BUN: 11 mg/dL (ref 6–20)
CO2: 23 mmol/L (ref 22–32)
Calcium: 9.1 mg/dL (ref 8.9–10.3)
Chloride: 105 mmol/L (ref 98–111)
Creatinine, Ser: 0.76 mg/dL (ref 0.61–1.24)
GFR, Estimated: >60 mL/min (ref >60)
Glucose, Bld: 144 mg/dL — ABNORMAL HIGH (ref 70–99)
Potassium: 3.9 mmol/L (ref 3.5–5.1)
Sodium: 138 mmol/L (ref 135–145)

## 2021-10-15 LAB — MAGNESIUM: Magnesium: 2.4 mg/dL (ref 1.7–2.4)

## 2021-10-15 MED ORDER — TRAVASOL 10 % IV SOLN
INTRAVENOUS | Status: AC
  Filled 2021-10-15: qty 1200

## 2021-10-15 MED ORDER — ACETAMINOPHEN 10 MG/ML IV SOLN
1000.0000 mg | Freq: Four times a day (QID) | INTRAVENOUS | Status: AC
  Administered 2021-10-15 – 2021-10-16 (×4): 1000 mg via INTRAVENOUS
  Filled 2021-10-15 (×4): qty 100

## 2021-10-15 NOTE — Progress Notes (Signed)
PHARMACY - TOTAL PARENTERAL NUTRITION CONSULT NOTE   Indication: Prolonged ileus, s/p ex lap w/ R colectomy, intra-abdominal abscess drainage 8/27  Patient Measurements: Height: 6\' 1"  (185.4 cm) Weight: 117 kg (257 lb 15 oz) IBW/kg (Calculated) : 79.9 TPN AdjBW (KG): 91.1 Body mass index is 34.03 kg/m.  Assessment: 41 yo male presented on 8/25 for abdominal pain. Recent history of admission for perforated appendicitis 7/26-7/30/23. IR placed CT-guided drainage catheter in RLQ abscess on 7/26 which was removed on 8/21 - noted mild RLQ abdominal pain since drain removal. CT on arrival this admit concerning for small residual or recurrent abscess not amendable to drainage and perforated appendicitis. Patient underwent ex lap w/ R colectomy and abscess drainage on 10/10/21. Pharmacy has been consulted for TPN in anticipation of post-op ileus. Patient is at risk for refeeding given prolonged NPO status.   8/31 Update: Trialed NG clamping yesterday with sips of apple juice. After 3 hours, experienced nausea and abdominal pain. NG returned to suction. Remains NPO.  Glucose / Insulin: no hx DM; CBGs range 120-150, 16 units of moderate SSI used/24 hours Electrolytes: K 3.9 (s/p 20 mEq IV Kcl), CoCa 10.4, all others WNL - Goal K >4 and Mg > 2 for ileus Renal: Scr < 1 and BUN 11 - both stable Hepatic: LFTs WNL, Prealbumin on 8/28 low at 6; TG 100 Intake / Output: 6mL drain output, 48m NG output, 0.3 mL/kg/hr UOP, LBM 8/26 MIVF: LR KVO mL/hr GI Imaging: 8/25 CTAP: large amount of inflammatory stranding in RLQ involving the terminal ileum, appendix, and cecum; 3.2 x 2.1 cm fluid collection adjacent to the cecum concerning for possible small residual or recurrent abscess GI Surgeries / Procedures: 8/27: ex lap w/ R colectomy and abscess drainage  Central access: double-lumen PICC placed 8/28 TPN start date: 8/29  Nutritional Goals: Goal TPN rate is 100 mL/hr (provides 120 g of protein and 2424  kcals per day)            RD Assessment: Estimated Needs Total Energy Estimated Needs: 2400-2600 Total Protein Estimated Needs: 120-140 grams Total Fluid Estimated Needs: >/= 2.4 L  Current Nutrition:  NPO  Plan:  Continue TPN at 128mL/hr at 1800 Electrolytes in TPN: Na 54mEq/L, K 19mEq/L, Ca 14mEq/L, Mg 56mEq/L, and Phos 20mmol/L. Cl:Ac 1:1 Add standard MVI and trace elements to TPN Continue Moderate q4h SSI and adjust as needed Monitor TPN labs on Mon/Thurs BMP tomorrow  12m, PharmD Clinical Pharmacist

## 2021-10-15 NOTE — Progress Notes (Signed)
Mobility Specialist - Progress Note   10/15/21 1202  Mobility  Activity Ambulated with assistance in hallway  Level of Assistance Modified independent, requires aide device or extra time  Assistive Device Other (Comment) (IV Pole)  Distance Ambulated (ft) 550 ft  Activity Response Tolerated well  $Mobility charge 1 Mobility    Pt received sitting EOB and agreeable to mobility. C/o mild pain d/t recent dressing change on abdomen. Left EOB w/ call bell within reach and all needs met.   Paulla Dolly Mobility Specialist

## 2021-10-15 NOTE — Consult Note (Signed)
WOC Nurse wound follow up Patient receiving care in [facility] 6N10. PA-C M. Maczis present for abdominal VAC dressing change. Wound type: surgical Measurement: deferred Wound bed: beefy red, no false bottom Drainage (amount, consistency, odor) red in cannister Periwound: intact Dressing procedure/placement/frequency: Black foam removed from wound. One piece of black foam cut to fit wound. Drape applied, immediate seal obtained. Both the PA and I notified the primary RN of the patient's request for pain medication.  I have asked the [place] to order a VAC dressing for Monday.  Helmut Muster, RN, MSN, CWOCN, CNS-BC, pager [phone]

## 2021-10-15 NOTE — Progress Notes (Signed)
5 Days Post-Op  Subjective: CC: Failed clamping trial yesterday. Nauseated this morning. 1.6L/24 hours from NGT, bilious. Not taking in much ice. Still passing flatus. No BM. Abdominal pain is stable/similar from yesterday. Mobilizing in halls. Voiding.   Objective: Vital signs in last 24 hours: Temp:  [97.8 F (36.6 C)-98.7 F (37.1 C)] 97.8 F (36.6 C) (09/01 0624) Pulse Rate:  [94-113] 94 (09/01 0624) Resp:  [16] 16 (08/31 1544) BP: (128-148)/(91-97) 128/91 (09/01 0624) SpO2:  [98 %-100 %] 98 % (09/01 0624) Weight:  [220 kg] 117 kg (09/01 0624) Last BM Date : 10/09/21  Intake/Output from previous day: 08/31 0701 - 09/01 0700 In: 4784.1 [P.O.:955; I.V.:2114.6; IV Piggyback:1714.5] Out: 2495 [Urine:850; Emesis/NG output:1600; Drains:45] Intake/Output this shift: Total I/O In: 74.9 [I.V.:74.9] Out: 300 [Urine:300]  PE: Gen: NAD Heart: RRR Lungs: CTA b/l, normal rate and effort  Abd: Mild to moderate distension but soft, hypoactive bowel sounds, appropriately tender around midline wound and drain but otherwise NT, JP with serosang output (20cc/24 hours). NGT with bilious output. 1600cc/24 hours. Midline wound with healthy granulation tissue at the base, no evidence of dehiscence. See picture below. Cont vac.     Lab Results:  Recent Labs    10/13/21 0556 10/15/21 0500  WBC 12.6* 10.7*  HGB 12.4* 12.2*  HCT 36.5* 36.7*  PLT 456* 535*   BMET Recent Labs    10/14/21 0315 10/15/21 0500  NA 138 138  K 3.4* 3.9  CL 105 105  CO2 24 23  GLUCOSE 145* 144*  BUN 9 11  CREATININE 0.78 0.76  CALCIUM 8.7* 9.1   PT/INR No results for input(s): "LABPROT", "INR" in the last 72 hours. CMP     Component Value Date/Time   NA 138 10/15/2021 0500   NA 139 03/19/2018 1440   K 3.9 10/15/2021 0500   CL 105 10/15/2021 0500   CO2 23 10/15/2021 0500   GLUCOSE 144 (H) 10/15/2021 0500   BUN 11 10/15/2021 0500   BUN 13 03/19/2018 1440   CREATININE 0.76 10/15/2021 0500    CREATININE 1.01 02/17/2016 1017   CALCIUM 9.1 10/15/2021 0500   PROT 7.0 10/14/2021 0315   PROT 7.3 03/19/2018 1440   ALBUMIN 2.4 (L) 10/14/2021 0315   ALBUMIN 4.3 03/19/2018 1440   AST 39 10/14/2021 0315   ALT 36 10/14/2021 0315   ALKPHOS 65 10/14/2021 0315   BILITOT 0.5 10/14/2021 0315   BILITOT 0.4 03/19/2018 1440   GFRNONAA >60 10/15/2021 0500   GFRAA 97 03/19/2018 1440   Lipase     Component Value Date/Time   LIPASE 14 10/08/2021 1224    Studies/Results: No results found.  Anti-infectives: Anti-infectives (From admission, onward)    Start     Dose/Rate Route Frequency Ordered Stop   10/08/21 2315  piperacillin-tazobactam (ZOSYN) IVPB 3.375 g        3.375 g 12.5 mL/hr over 240 Minutes Intravenous Every 8 hours 10/08/21 2230     10/08/21 2245  piperacillin-tazobactam (ZOSYN) IVPB 3.375 g  Status:  Discontinued        3.375 g 100 mL/hr over 30 Minutes Intravenous Every 8 hours 10/08/21 2151 10/08/21 2229        Assessment/Plan POD #5, s/p ex lap with right colectomy and drainage of intraabdominal abscess and wound VAC placement 8/27 Dr. Andrey Campanile - Surgical path benign - Cont IV abx therapy, a lot of intra-op contamination so would benefit likely from a week of abx. Intra-op cx's with Strep C,  Bacteroides Fragilis, beta lactamase neg, and abundant Eikenella Corrodens. No sensitivities on report. Discussed with Pharm who recommended continuing Zosyn   - Failed clamping trial 8/31. Cont NGT to LIWS - Cont TPN  - Cont JP, currently SS - Wound vac - M/W/F. - Mobilize, okay to clamp NGT for mobilization.  - Pulm toilet - Scheduled IV Tylenol and IV Robaxin to assist with pain control. PRN Dilaudid.    FEN - Clamp NGT, IVFs, TNA (K 3.4) VTE - SCDs, Lovenox ID - zosyn 8/25 >> WBC down at 10.7. Afebrile. Tachycardia resolved. No hypotension.    ABL anemia - Hgb stable at 12.2 OSA - CPAP HTN - Improved this am. IV PRNs while NPO. Obesity BMI 36.3   LOS: 7 days     Jacinto Halim , [facility] Surgery 10/15/2021, 9:05 AM Please see Amion for pager number during day hours 7:00am-4:30pm

## 2021-10-16 LAB — BASIC METABOLIC PANEL
Anion gap: 10 (ref 5–15)
BUN: 11 mg/dL (ref 6–20)
CO2: 24 mmol/L (ref 22–32)
Calcium: 9.3 mg/dL (ref 8.9–10.3)
Chloride: 102 mmol/L (ref 98–111)
Creatinine, Ser: 0.83 mg/dL (ref 0.61–1.24)
GFR, Estimated: >60 mL/min (ref >60)
Glucose, Bld: 170 mg/dL — ABNORMAL HIGH (ref 70–99)
Potassium: 4.4 mmol/L (ref 3.5–5.1)
Sodium: 136 mmol/L (ref 135–145)

## 2021-10-16 LAB — CBC
HCT: 37.9 % — ABNORMAL LOW (ref 39.0–52.0)
Hemoglobin: 12.6 g/dL — ABNORMAL LOW (ref 13.0–17.0)
MCH: 30.1 pg (ref 26.0–34.0)
MCHC: 33.2 g/dL (ref 30.0–36.0)
MCV: 90.7 fL (ref 80.0–100.0)
Platelets: 616 10*3/uL — ABNORMAL HIGH (ref 150–400)
RBC: 4.18 MIL/uL — ABNORMAL LOW (ref 4.22–5.81)
RDW: 14.1 % (ref 11.5–15.5)
WBC: 11.5 10*3/uL — ABNORMAL HIGH (ref 4.0–10.5)
nRBC: 0 % (ref 0.0–0.2)

## 2021-10-16 LAB — GLUCOSE, CAPILLARY
Glucose-Capillary: 133 mg/dL — ABNORMAL HIGH (ref 70–99)
Glucose-Capillary: 144 mg/dL — ABNORMAL HIGH (ref 70–99)
Glucose-Capillary: 146 mg/dL — ABNORMAL HIGH (ref 70–99)
Glucose-Capillary: 153 mg/dL — ABNORMAL HIGH (ref 70–99)
Glucose-Capillary: 156 mg/dL — ABNORMAL HIGH (ref 70–99)
Glucose-Capillary: 156 mg/dL — ABNORMAL HIGH (ref 70–99)

## 2021-10-16 MED ORDER — TRAVASOL 10 % IV SOLN
INTRAVENOUS | Status: AC
  Filled 2021-10-16: qty 1200

## 2021-10-16 MED ORDER — ACETAMINOPHEN 10 MG/ML IV SOLN
1000.0000 mg | Freq: Four times a day (QID) | INTRAVENOUS | Status: AC
Start: 2021-10-16 — End: 2021-10-17
  Administered 2021-10-16 – 2021-10-17 (×4): 1000 mg via INTRAVENOUS
  Filled 2021-10-16 (×4): qty 100

## 2021-10-16 NOTE — Progress Notes (Signed)
6 Days Post-Op  Subjective: CC: Failed clamping trial Thur. thich bilious output. Still passing flatus. No BM. Abdominal pain is better. Mobilizing in halls. Voiding.   Objective: Vital signs in last 24 hours: Temp:  [97.6 F (36.4 C)-99 F (37.2 C)] 97.6 F (36.4 C) (09/02 0740) Pulse Rate:  [94-123] 123 (09/02 0740) Resp:  [16-18] 16 (09/02 0740) BP: (125-147)/(94-99) 125/94 (09/02 0740) SpO2:  [97 %-99 %] 98 % (09/02 0740) Last BM Date : 10/09/21  Intake/Output from previous day: 09/01 0701 - 09/02 0700 In: 2298.8 [I.V.:1569.3; IV Piggyback:729.5] Out: 2150 [Urine:1175; Emesis/NG output:950; Drains:25] Intake/Output this shift: No intake/output data recorded.  PE: Gen: NAD  Abd: Mild to moderate distension but soft, appropriately tender, JP with serosang output (20cc/24 hours). NGT with bilious output. vac in place.   10/15/21    Lab Results:  Recent Labs    10/15/21 0500 10/16/21 0420  WBC 10.7* 11.5*  HGB 12.2* 12.6*  HCT 36.7* 37.9*  PLT 535* 616*    BMET Recent Labs    10/15/21 0500 10/16/21 0420  NA 138 136  K 3.9 4.4  CL 105 102  CO2 23 24  GLUCOSE 144* 170*  BUN 11 11  CREATININE 0.76 0.83  CALCIUM 9.1 9.3    PT/INR No results for input(s): "LABPROT", "INR" in the last 72 hours. CMP     Component Value Date/Time   NA 136 10/16/2021 0420   NA 139 03/19/2018 1440   K 4.4 10/16/2021 0420   CL 102 10/16/2021 0420   CO2 24 10/16/2021 0420   GLUCOSE 170 (H) 10/16/2021 0420   BUN 11 10/16/2021 0420   BUN 13 03/19/2018 1440   CREATININE 0.83 10/16/2021 0420   CREATININE 1.01 02/17/2016 1017   CALCIUM 9.3 10/16/2021 0420   PROT 7.0 10/14/2021 0315   PROT 7.3 03/19/2018 1440   ALBUMIN 2.4 (L) 10/14/2021 0315   ALBUMIN 4.3 03/19/2018 1440   AST 39 10/14/2021 0315   ALT 36 10/14/2021 0315   ALKPHOS 65 10/14/2021 0315   BILITOT 0.5 10/14/2021 0315   BILITOT 0.4 03/19/2018 1440   GFRNONAA >60 10/16/2021 0420   GFRAA 97  03/19/2018 1440   Lipase     Component Value Date/Time   LIPASE 14 10/08/2021 1224    Studies/Results: No results found.  Anti-infectives: Anti-infectives (From admission, onward)    Start     Dose/Rate Route Frequency Ordered Stop   10/08/21 2315  piperacillin-tazobactam (ZOSYN) IVPB 3.375 g        3.375 g 12.5 mL/hr over 240 Minutes Intravenous Every 8 hours 10/08/21 2230     10/08/21 2245  piperacillin-tazobactam (ZOSYN) IVPB 3.375 g  Status:  Discontinued        3.375 g 100 mL/hr over 30 Minutes Intravenous Every 8 hours 10/08/21 2151 10/08/21 2229        Assessment/Plan POD #6, s/p ex lap with right colectomy and drainage of intraabdominal abscess and wound VAC placement 8/27 Dr. Andrey Campanile - Surgical path benign - Cont IV abx therapy for 1 week of abx. Intra-op cx's with Strep C, Bacteroides Fragilis, beta lactamase neg, and abundant Eikenella Corrodens. No sensitivities on report. Discussed with Pharm who recommended continuing Zosyn (to stop 9/3)  - Failed clamping trial 8/31. Cont NGT to LIWS - Cont TPN  - Cont JP, currently SS - Wound vac - M/W/F. - Mobilize, okay to clamp NGT for mobilization.  - Pulm toilet - Scheduled IV Tylenol and IV Robaxin  to assist with pain control. PRN Dilaudid.    FEN - NGT, IVFs, TNA (K 3.4) VTE - SCDs, Lovenox ID - zosyn 8/25 >> 9/3 WBC stable. Afebrile. Tachycardia resolved. No hypotension.    ABL anemia - Hgb stable at 12.2 OSA - CPAP HTN -  IV PRNs while NPO. Obesity BMI 36.3   LOS: 8 days    Vanita Panda , MD [facility] Surgery 10/16/2021, 8:50 AM Please see Amion for pager number during day hours 7:00am-4:30pm

## 2021-10-16 NOTE — Progress Notes (Signed)
   10/16/21 0748  Assess: MEWS Score  Level of Consciousness Alert  Assess: MEWS Score  MEWS Temp 0  MEWS Systolic 0  MEWS Pulse 2  MEWS RR 0  MEWS LOC 0  MEWS Score 2  MEWS Score Color Yellow  Assess: if the MEWS score is Yellow or Red  Were vital signs taken at a resting state? Yes  Focused Assessment Change from prior assessment (see assessment flowsheet)  Does the patient meet 2 or more of the SIRS criteria? Yes  Does the patient have a confirmed or suspected source of infection? No  Provider and Rapid Response Notified? Yes  Treat  MEWS Interventions Escalated (See documentation below)  Pain Scale 0-10  Pain Score 6  Pain Type Surgical pain;Acute pain  Pain Location Abdomen  Take Vital Signs  Increase Vital Sign Frequency  Yellow: Q 2hr X 2 then Q 4hr X 2, if remains yellow, continue Q 4hrs  Escalate  MEWS: Escalate Yellow: discuss with charge nurse/RN and consider discussing with provider and RRT  Notify: Charge Nurse/RN  Name of Charge Nurse/RN Notified Kirtland Bouchard RN  Date Charge Nurse/RN Notified 10/16/21  Time Charge Nurse/RN Notified 0750  Notify: Provider  Provider Name/Title Hosie Spangle PA  Date Provider Notified 10/16/21  Time Provider Notified 0800  Method of Notification Page  Notification Reason Change in status  Provider response See new orders  Date of Provider Response 10/16/21  Time of Provider Response 0809  Document  Patient Outcome Stabilized after interventions  Progress note created (see row info) Yes

## 2021-10-16 NOTE — Progress Notes (Signed)
PHARMACY - TOTAL PARENTERAL NUTRITION CONSULT NOTE   Indication: Prolonged ileus, s/p ex lap w/ R colectomy, intra-abdominal abscess drainage 8/27  Patient Measurements: Height: 6\' 1"  (185.4 cm) Weight: 117 kg (257 lb 15 oz) IBW/kg (Calculated) : 79.9 TPN AdjBW (KG): 91.1 Body mass index is 34.03 kg/m.  Assessment: 41 yo male presented on 8/25 for abdominal pain. Recent history of admission for perforated appendicitis 7/26-7/30/23. IR placed CT-guided drainage catheter in RLQ abscess on 7/26 which was removed on 8/21 - noted mild RLQ abdominal pain since drain removal. CT on arrival this admit concerning for small residual or recurrent abscess not amendable to drainage and perforated appendicitis. Patient underwent ex lap w/ R colectomy and abscess drainage on 10/10/21. Pharmacy has been consulted for TPN in anticipation of post-op ileus. Patient is at risk for refeeding given prolonged NPO status.   8/31 Failed clamping NGT trial.  9/2 No BM yet. Passing flatus. Abd pain improving. Clamping NGT for mobilization. Remains NPO.  Glucose / Insulin: no hx DM; CBGs 150s, 13 units of moderate SSI used/24 hours Electrolytes: CoCa 10.6, all others WNL Goal K >4 and Mg > 2 for ileus Renal: Scr < 1 and BUN 11  Hepatic: LFTs WNL, Prealbumin on 8/28 low at 6; TG 100 Intake / Output: 60 mL drain output, 9/28 NG output, UOP 1375 ml/24h, LBM 8/26 MIVF: LR KVO mL/hr GI Imaging: 8/25 CTAP: large amount of inflammatory stranding in RLQ involving the terminal ileum, appendix, and cecum; 3.2 x 2.1 cm fluid collection adjacent to the cecum concerning for possible small residual or recurrent abscess GI Surgeries / Procedures: 8/27: ex lap w/ R colectomy and abscess drainage  Central access: double-lumen PICC placed 8/28 TPN start date: 8/29  Nutritional Goals: Goal TPN rate is 100 mL/hr (provides 120 g of protein and 2424 kcals per day)            RD Assessment: Estimated Needs Total Energy Estimated  Needs: 2400-2600 Total Protein Estimated Needs: 120-140 grams Total Fluid Estimated Needs: >/= 2.4 L  Current Nutrition:  NPO TPN at goal  Plan:  Continue TPN at goal rate of 148ml/hr providing 120 gm protein and 2424 kcal. Electrolytes in TPN: Na 39mEq/L, K 33mEq/L, Ca 10mEq/L, Mg 21mEq/L, and Phos 63mmol/L. Cl:Ac 1:1 Add standard MVI and trace elements to TPN Continue Moderate q4h SSI and adjust as needed Monitor TPN labs on Mon/Thurs, BMET in a.m.  12m, PharmD, BCPS Please see amion for complete clinical pharmacist phone list 10/16/2021 9:05 AM

## 2021-10-17 LAB — GLUCOSE, CAPILLARY
Glucose-Capillary: 162 mg/dL — ABNORMAL HIGH (ref 70–99)
Glucose-Capillary: 192 mg/dL — ABNORMAL HIGH (ref 70–99)
Glucose-Capillary: 140 mg/dL — ABNORMAL HIGH (ref 70–99)
Glucose-Capillary: 178 mg/dL — ABNORMAL HIGH (ref 70–99)
Glucose-Capillary: 134 mg/dL — ABNORMAL HIGH (ref 70–99)

## 2021-10-17 LAB — CBC
HCT: 38.9 % — ABNORMAL LOW (ref 39.0–52.0)
Hemoglobin: 12.5 g/dL — ABNORMAL LOW (ref 13.0–17.0)
MCH: 29.8 pg (ref 26.0–34.0)
MCHC: 32.1 g/dL (ref 30.0–36.0)
MCV: 92.6 fL (ref 80.0–100.0)
Platelets: 707 10*3/uL — ABNORMAL HIGH (ref 150–400)
RBC: 4.2 MIL/uL — ABNORMAL LOW (ref 4.22–5.81)
RDW: 14.2 % (ref 11.5–15.5)
WBC: 11.8 10*3/uL — ABNORMAL HIGH (ref 4.0–10.5)
nRBC: 0.2 % (ref 0.0–0.2)

## 2021-10-17 LAB — BASIC METABOLIC PANEL
Anion gap: 10 (ref 5–15)
BUN: 12 mg/dL (ref 6–20)
CO2: 23 mmol/L (ref 22–32)
Calcium: 9.2 mg/dL (ref 8.9–10.3)
Chloride: 102 mmol/L (ref 98–111)
Creatinine, Ser: 0.78 mg/dL (ref 0.61–1.24)
GFR, Estimated: >60 mL/min (ref >60)
Glucose, Bld: 163 mg/dL — ABNORMAL HIGH (ref 70–99)
Potassium: 4.3 mmol/L (ref 3.5–5.1)
Sodium: 135 mmol/L (ref 135–145)

## 2021-10-17 MED ORDER — TRAVASOL 10 % IV SOLN
INTRAVENOUS | Status: AC
  Filled 2021-10-17: qty 1200

## 2021-10-17 NOTE — Progress Notes (Signed)
PHARMACY - TOTAL PARENTERAL NUTRITION CONSULT NOTE   Indication: Prolonged ileus, s/p ex lap w/ R colectomy, intra-abdominal abscess drainage 8/27  Patient Measurements: Height: 6\' 1"  (185.4 cm) Weight: 117 kg (257 lb 15 oz) IBW/kg (Calculated) : 79.9 TPN AdjBW (KG): 91.1 Body mass index is 34.03 kg/m.  Assessment: 41 yo male presented on 8/25 for abdominal pain. Recent history of admission for perforated appendicitis 7/26-7/30/23. IR placed CT-guided drainage catheter in RLQ abscess on 7/26 which was removed on 8/21 - noted mild RLQ abdominal pain since drain removal. CT on arrival this admit concerning for small residual or recurrent abscess not amendable to drainage and perforated appendicitis. Patient underwent ex lap w/ R colectomy and abscess drainage on 10/10/21. Pharmacy has been consulted for TPN in anticipation of post-op ileus. Patient is at risk for refeeding given prolonged NPO status.   Glucose / Insulin: no hx DM; CBGs 120-192, 18 units of moderate SSI used/24 hours Electrolytes: CoCa 10.5, others WNL Goal K >4 and Mg > 2 for ileus Renal: Scr < 1 and BUN 12 Hepatic: LFTs WNL, Prealbumin on 8/28 low at 6; TG 100 Intake / Output: 9/28 NG output past 8 hours - not charted yesterday, UOP 550 recorded, LBM 9/2 MIVF: LR KVO mL/hr GI Imaging: 8/25 CTAP: large amount of inflammatory stranding in RLQ involving the terminal ileum, appendix, and cecum; 3.2 x 2.1 cm fluid collection adjacent to the cecum concerning for possible small residual or recurrent abscess GI Surgeries / Procedures: 8/27: ex lap w/ R colectomy and abscess drainage  Central access: double-lumen PICC placed 8/28 TPN start date: 8/29  Nutritional Goals: Goal TPN rate is 100 mL/hr (provides 120 g of protein and 2424 kcals per day)            RD Assessment: Estimated Needs Total Energy Estimated Needs: 2400-2600 Total Protein Estimated Needs: 120-140 grams Total Fluid Estimated Needs: >/= 2.4 L  Current  Nutrition:  NPO TPN at goal  Plan:  Continue TPN at goal rate of 158ml/hr providing 120 gm protein and 2424 kcal. Electrolytes in TPN: Na 40mEq/L, K 94mEq/L, Ca 74mEq/L, Mg 60mEq/L, and Phos 34mmol/L. Cl:Ac 1:1 Add standard MVI and trace elements to TPN Continue Moderate q4h SSI and adjust as needed Monitor TPN labs on Mon/Thurs  12m, PharmD, BCPS Please see amion for complete clinical pharmacist phone list 10/17/2021 9:22 AM

## 2021-10-17 NOTE — Progress Notes (Signed)
7 Days Post-Op  Subjective: CC: Had a large BM with dark blood.  Passing flatus.  Feeling better  Objective: Vital signs in last 24 hours: Temp:  [98.1 F (36.7 C)-98.7 F (37.1 C)] 98.3 F (36.8 C) (09/03 0740) Pulse Rate:  [96-132] 96 (09/03 0740) Resp:  [16-18] 16 (09/03 0740) BP: (131-150)/(89-103) 137/101 (09/03 0740) SpO2:  [98 %-100 %] 98 % (09/03 0740) Last BM Date : 10/16/21  Intake/Output from previous day: 09/02 0701 - 09/03 0700 In: 4244.4 [I.V.:3084.2; IV Piggyback:1160.1] Out: 1925 [Urine:725; Emesis/NG output:1200] Intake/Output this shift: Total I/O In: -  Out: 100 [Urine:100]  PE: Gen: NAD  Abd: Mild to moderate distension but soft, appropriately tender, JP with serosang output (20cc/24 hours). NGT with clear bile tinged output. vac in place.   10/15/21    Lab Results:  Recent Labs    10/16/21 0420 10/17/21 0438  WBC 11.5* 11.8*  HGB 12.6* 12.5*  HCT 37.9* 38.9*  PLT 616* 707*    BMET Recent Labs    10/16/21 0420 10/17/21 0438  NA 136 135  K 4.4 4.3  CL 102 102  CO2 24 23  GLUCOSE 170* 163*  BUN 11 12  CREATININE 0.83 0.78  CALCIUM 9.3 9.2    PT/INR No results for input(s): "LABPROT", "INR" in the last 72 hours. CMP     Component Value Date/Time   NA 135 10/17/2021 0438   NA 139 03/19/2018 1440   K 4.3 10/17/2021 0438   CL 102 10/17/2021 0438   CO2 23 10/17/2021 0438   GLUCOSE 163 (H) 10/17/2021 0438   BUN 12 10/17/2021 0438   BUN 13 03/19/2018 1440   CREATININE 0.78 10/17/2021 0438   CREATININE 1.01 02/17/2016 1017   CALCIUM 9.2 10/17/2021 0438   PROT 7.0 10/14/2021 0315   PROT 7.3 03/19/2018 1440   ALBUMIN 2.4 (L) 10/14/2021 0315   ALBUMIN 4.3 03/19/2018 1440   AST 39 10/14/2021 0315   ALT 36 10/14/2021 0315   ALKPHOS 65 10/14/2021 0315   BILITOT 0.5 10/14/2021 0315   BILITOT 0.4 03/19/2018 1440   GFRNONAA >60 10/17/2021 0438   GFRAA 97 03/19/2018 1440   Lipase     Component Value Date/Time   LIPASE 14  10/08/2021 1224    Studies/Results: No results found.  Anti-infectives: Anti-infectives (From admission, onward)    Start     Dose/Rate Route Frequency Ordered Stop   10/08/21 2315  piperacillin-tazobactam (ZOSYN) IVPB 3.375 g        3.375 g 12.5 mL/hr over 240 Minutes Intravenous Every 8 hours 10/08/21 2230     10/08/21 2245  piperacillin-tazobactam (ZOSYN) IVPB 3.375 g  Status:  Discontinued        3.375 g 100 mL/hr over 30 Minutes Intravenous Every 8 hours 10/08/21 2151 10/08/21 2229        Assessment/Plan POD #7, s/p ex lap with right colectomy and drainage of intraabdominal abscess and wound VAC placement 8/27 Dr. Andrey Campanile - Surgical path benign - Cont IV abx therapy for 1 week of abx. Intra-op cx's with Strep C, Bacteroides Fragilis, beta lactamase neg, and abundant Eikenella Corrodens. No sensitivities on report. Discussed with Pharm who recommended continuing Zosyn (to stop 9/3)  - Failed clamping trial 8/31. Clamp NGT today and check residuals - Cont TPN  - Cont JP, currently SS - Wound vac - M/W/F. - Mobilize, okay to clamp NGT for mobilization.  - Pulm toilet - Scheduled IV Tylenol and IV Robaxin to assist  with pain control. PRN Dilaudid.    FEN - NGT, IVFs, TNA  VTE - SCDs, Lovenox ID - zosyn 8/25 >> 9/3 WBC stable. Afebrile. Tachycardia resolved. No hypotension.    ABL anemia - Hgb stable  OSA - CPAP HTN -  IV PRNs while NPO. Obesity BMI 36.3   LOS: 9 days    Ryan Herman , MD [facility] Surgery 10/17/2021, 10:09 AM Please see Amion for pager number during day hours 7:00am-4:30pm

## 2021-10-18 LAB — COMPREHENSIVE METABOLIC PANEL
ALT: 91 U/L — ABNORMAL HIGH (ref 0–44)
AST: 41 U/L (ref 15–41)
Albumin: 2.9 g/dL — ABNORMAL LOW (ref 3.5–5.0)
Alkaline Phosphatase: 74 U/L (ref 38–126)
Anion gap: 8 (ref 5–15)
BUN: 15 mg/dL (ref 6–20)
CO2: 24 mmol/L (ref 22–32)
Calcium: 9.1 mg/dL (ref 8.9–10.3)
Chloride: 101 mmol/L (ref 98–111)
Creatinine, Ser: 0.85 mg/dL (ref 0.61–1.24)
GFR, Estimated: >60 mL/min (ref >60)
Glucose, Bld: 142 mg/dL — ABNORMAL HIGH (ref 70–99)
Potassium: 4.2 mmol/L (ref 3.5–5.1)
Sodium: 133 mmol/L — ABNORMAL LOW (ref 135–145)
Total Bilirubin: 0.4 mg/dL (ref 0.3–1.2)
Total Protein: 7.8 g/dL (ref 6.5–8.1)

## 2021-10-18 LAB — GLUCOSE, CAPILLARY
Glucose-Capillary: 139 mg/dL — ABNORMAL HIGH (ref 70–99)
Glucose-Capillary: 146 mg/dL — ABNORMAL HIGH (ref 70–99)
Glucose-Capillary: 148 mg/dL — ABNORMAL HIGH (ref 70–99)
Glucose-Capillary: 150 mg/dL — ABNORMAL HIGH (ref 70–99)
Glucose-Capillary: 152 mg/dL — ABNORMAL HIGH (ref 70–99)

## 2021-10-18 LAB — TRIGLYCERIDES: Triglycerides: 119 mg/dL (ref <150)

## 2021-10-18 LAB — PHOSPHORUS: Phosphorus: 4.9 mg/dL — ABNORMAL HIGH (ref 2.5–4.6)

## 2021-10-18 LAB — MAGNESIUM: Magnesium: 2.3 mg/dL (ref 1.7–2.4)

## 2021-10-18 MED ORDER — HYDROMORPHONE HCL 1 MG/ML IJ SOLN
0.5000 mg | INTRAMUSCULAR | Status: DC | PRN
  Administered 2021-10-18: 1 mg via INTRAVENOUS
  Filled 2021-10-18: qty 1

## 2021-10-18 MED ORDER — METHOCARBAMOL 500 MG PO TABS
1000.0000 mg | ORAL_TABLET | Freq: Four times a day (QID) | ORAL | Status: DC | PRN
Start: 2021-10-18 — End: 2021-10-19

## 2021-10-18 MED ORDER — DIPHENHYDRAMINE HCL 25 MG PO CAPS
25.0000 mg | ORAL_CAPSULE | Freq: Every evening | ORAL | Status: DC | PRN
Start: 2021-10-18 — End: 2021-10-19

## 2021-10-18 MED ORDER — INSULIN ASPART 100 UNIT/ML IJ SOLN: 0.0000 [IU] | Freq: Three times a day (TID) | INTRAMUSCULAR | Status: DC

## 2021-10-18 MED ORDER — OXYCODONE HCL 5 MG PO TABS: 5.0000 mg | ORAL_TABLET | ORAL | Status: DC | PRN

## 2021-10-18 MED ORDER — ADULT MULTIVITAMIN W/MINERALS CH
1.0000 | ORAL_TABLET | Freq: Every day | ORAL | Status: DC
  Administered 2021-10-19: 1 via ORAL
  Filled 2021-10-18: qty 1

## 2021-10-18 MED ORDER — INSULIN ASPART 100 UNIT/ML IJ SOLN
0.0000 [IU] | Freq: Three times a day (TID) | INTRAMUSCULAR | Status: AC
  Administered 2021-10-18: 2 [IU] via SUBCUTANEOUS

## 2021-10-18 MED ORDER — AMLODIPINE BESYLATE 10 MG PO TABS
10.0000 mg | ORAL_TABLET | Freq: Every day | ORAL | Status: DC
  Administered 2021-10-18 – 2021-10-19 (×2): 10 mg via ORAL
  Filled 2021-10-18 (×2): qty 1

## 2021-10-18 NOTE — Progress Notes (Signed)
8 Days Post-Op  Subjective: CC: Having good bowel function.  Feeling better overall  Objective: Vital signs in last 24 hours: Temp:  [98.2 F (36.8 C)-98.5 F (36.9 C)] 98.4 F (36.9 C) (09/04 0421) Pulse Rate:  [92-110] 92 (09/04 0741) Resp:  [18] 18 (09/04 0741) BP: (120-142)/(82-95) 124/87 (09/04 0741) SpO2:  [99 %-100 %] 99 % (09/04 0741) Last BM Date : 10/18/21  Intake/Output from previous day: 09/03 0701 - 09/04 0700 In: 1035.8 [P.O.:480; IV Piggyback:555.8] Out: 1386 [Urine:601; Emesis/NG output:700; Drains:85] Intake/Output this shift: No intake/output data recorded.  PE: Gen: NAD  Abd: Mild to moderate distension but soft, appropriately tender, JP with serosang output (20cc/24 hours). NGT with clear bile tinged output. vac in place.      Lab Results:  Recent Labs    10/16/21 0420 10/17/21 0438  WBC 11.5* 11.8*  HGB 12.6* 12.5*  HCT 37.9* 38.9*  PLT 616* 707*    BMET Recent Labs    10/17/21 0438 10/18/21 0533  NA 135 133*  K 4.3 4.2  CL 102 101  CO2 23 24  GLUCOSE 163* 142*  BUN 12 15  CREATININE 0.78 0.85  CALCIUM 9.2 9.1    PT/INR No results for input(s): "LABPROT", "INR" in the last 72 hours. CMP     Component Value Date/Time   NA 133 (L) 10/18/2021 0533   NA 139 03/19/2018 1440   K 4.2 10/18/2021 0533   CL 101 10/18/2021 0533   CO2 24 10/18/2021 0533   GLUCOSE 142 (H) 10/18/2021 0533   BUN 15 10/18/2021 0533   BUN 13 03/19/2018 1440   CREATININE 0.85 10/18/2021 0533   CREATININE 1.01 02/17/2016 1017   CALCIUM 9.1 10/18/2021 0533   PROT 7.8 10/18/2021 0533   PROT 7.3 03/19/2018 1440   ALBUMIN 2.9 (L) 10/18/2021 0533   ALBUMIN 4.3 03/19/2018 1440   AST 41 10/18/2021 0533   ALT 91 (H) 10/18/2021 0533   ALKPHOS 74 10/18/2021 0533   BILITOT 0.4 10/18/2021 0533   BILITOT 0.4 03/19/2018 1440   GFRNONAA >60 10/18/2021 0533   GFRAA 97 03/19/2018 1440   Lipase     Component Value Date/Time   LIPASE 14 10/08/2021 1224     Studies/Results: No results found.  Anti-infectives: Anti-infectives (From admission, onward)    Start     Dose/Rate Route Frequency Ordered Stop   10/08/21 2315  piperacillin-tazobactam (ZOSYN) IVPB 3.375 g        3.375 g 12.5 mL/hr over 240 Minutes Intravenous Every 8 hours 10/08/21 2230 10/18/21 0359   10/08/21 2245  piperacillin-tazobactam (ZOSYN) IVPB 3.375 g  Status:  Discontinued        3.375 g 100 mL/hr over 30 Minutes Intravenous Every 8 hours 10/08/21 2151 10/08/21 2229        Assessment/Plan POD #8, s/p ex lap with right colectomy and drainage of intraabdominal abscess and wound VAC placement 8/27 Dr. Andrey Campanile - Surgical path benign -tolerating a liquid diet - Can d/c TPN after this bag, assuming he is tolerating reg diet - Cont JP, currently SS - Wound vac - M/W/F. - Mobilize in hall, especially after meals - Pulm toilet - Scheduled IV Tylenol and IV Robaxin to assist with pain control. PRN Dilaudid.    FEN - RD VTE - SCDs, Lovenox ID - zosyn 8/25 >> 9/3 WBC stable. Afebrile. Tachycardia resolved. No hypotension.    ABL anemia - Hgb stable  OSA - CPAP HTN -  restart home meds  Obesity BMI 36.3   LOS: 10 days    Ryan Herman , MD [facility] Surgery 10/18/2021, 8:43 AM Please see Amion for pager number during day hours 7:00am-4:30pm

## 2021-10-18 NOTE — Plan of Care (Signed)

## 2021-10-18 NOTE — Progress Notes (Signed)
PHARMACY - TOTAL PARENTERAL NUTRITION CONSULT NOTE   Indication: Prolonged ileus, s/p ex lap w/ R colectomy, intra-abdominal abscess drainage 8/27  Patient Measurements: Height: 6\' 1"  (185.4 cm) Weight: 117 kg (257 lb 15 oz) IBW/kg (Calculated) : 79.9 TPN AdjBW (KG): 91.1 Body mass index is 34.03 kg/m.  Assessment: 41 yo male presented on 8/25 for abdominal pain. Recent history of admission for perforated appendicitis 7/26-7/30/23. IR placed CT-guided drainage catheter in RLQ abscess on 7/26 which was removed on 8/21 - noted mild RLQ abdominal pain since drain removal. CT on arrival this admit concerning for small residual or recurrent abscess not amendable to drainage and perforated appendicitis. Patient underwent ex lap w/ R colectomy and abscess drainage on 10/10/21. Pharmacy has been consulted for TPN in anticipation of post-op ileus. Patient is at risk for refeeding given prolonged NPO status.    Plan:  Pt advanced to regular diet today. Plan to d/c TPN past current bag per surgery note. RN to call pharmacy if pt does not tolerate regular diet at lunch. Will d/c SSI/CBG monitoring post noon dose. Change multivitamin to tablet starting tomorrow (pta med) Change diphenhydramine to po   10/12/21, PharmD, BCPS Please see amion for complete clinical pharmacist phone list 10/18/2021 10:51 AM

## 2021-10-18 NOTE — Progress Notes (Signed)
Mobility Specialist Progress Note:   10/18/21 1245  Mobility  Activity Ambulated independently in room  Level of Assistance Modified independent, requires aide device or extra time  Assistive Device Other (Comment) (IV Pole)  Distance Ambulated (ft) 50 ft  Activity Response Tolerated well  $Mobility charge 1 Mobility   Pt received ambulating independently in room with IV Pole + wound vac. No assistance required. States he will ambulated around unit after lunch.   Ryan Herman Acute Rehab Secure Chat or Office Phone: [phone]

## 2021-10-18 NOTE — Consult Note (Signed)
WOC Nurse Consult Note: Reason for Consult:Midline abdominal wound with NPWT in place.  Wound type:surgical Pressure Injury POA: NA Measurement: 16 cm x 4 cm x 1.6 cm  Wound bed: Beefy red, blue sutures in wound bed Drainage (amount, consistency, odor) minimal serosanguinous  no odor Periwound: intact  abdominal hair is shaved.  Dressing procedure/placement/frequency:1 piece black foam.  Covered with drape.   Seal immediately achieved at 125 mmHG.  Change Mon/Wed/Fri.  Will follow.  Maple Hudson MSN, RN, FNP-BC CWON Wound, Ostomy, Continence Nurse Pager [phone]

## 2021-10-19 ENCOUNTER — Other Ambulatory Visit (HOSPITAL_COMMUNITY): Payer: Self-pay

## 2021-10-19 LAB — GLUCOSE, CAPILLARY: Glucose-Capillary: 134 mg/dL — ABNORMAL HIGH (ref 70–99)

## 2021-10-19 MED ORDER — ACETAMINOPHEN 500 MG PO TABS: 1000.0000 mg | ORAL_TABLET | Freq: Four times a day (QID) | ORAL | Status: DC | PRN

## 2021-10-19 MED ORDER — METHOCARBAMOL 500 MG PO TABS
1000.0000 mg | ORAL_TABLET | Freq: Four times a day (QID) | ORAL | 0 refills | Status: AC | PRN
Start: 2021-10-19 — End: ?
  Filled 2021-10-19: qty 60, 8d supply, fill #0

## 2021-10-19 MED ORDER — ENSURE ENLIVE PO LIQD: 237.0000 mL | Freq: Three times a day (TID) | ORAL | Status: DC

## 2021-10-19 MED ORDER — OXYCODONE HCL 5 MG PO TABS
5.0000 mg | ORAL_TABLET | Freq: Four times a day (QID) | ORAL | 0 refills | Status: AC | PRN
  Filled 2021-10-19: qty 20, 5d supply, fill #0

## 2021-10-19 NOTE — TOC Progression Note (Addendum)
Transition of Care [facility]) - Progression Note    Patient Details  Name: Ryan Herman MRN: [id number] Date of Birth: 1980-03-17  Transition of Care [facility]) CM/SW Contact  Nadene Rubins Adria Devon, RN Phone Number: 10/19/2021, 10:48 AM  Clinical Narrative:     Possible discharge this afternoon, pending home VAC and home health start of care Wednesday 10/20/21.   Cory with [facility] confirmed home health can visit 10/20/21 for dressing change.   French Ana with 67M awaiting insurance authorization for home VAC. Patient aware   French Ana with 67M released home VAC. Same delivered to patient's hospital room. NCM secure chatted nurse and PA   Expected Discharge Plan: Home w Home Health Services Barriers to Discharge: Continued Medical Work up  Expected Discharge Plan and Services Expected Discharge Plan: Home w Home Health Services       Living arrangements for the past 2 months: Single Family Home                   DME Agency: NA                   Social Determinants of Health (SDOH) Interventions    Readmission Risk Interventions     No data to display

## 2021-10-19 NOTE — Progress Notes (Signed)
Discharge instructions given to pt. Pt verbalized understanding of all teaching and had no further questions. JP drain removed, home wound vac applied. Patient currently waiting in room for medications to be delivered for discharge.

## 2021-10-19 NOTE — Progress Notes (Signed)
Mobility Specialist - Progress Note   10/19/21 1143  Mobility  Activity Refused mobility  $Mobility charge 1 Mobility   Pt received in bed expecting discharge soon. Stated he has been up and walking on his own for the past couple days. Left in bed w/ call bell in reach and all needs met.   Paulla Dolly Mobility Specialist

## 2021-10-19 NOTE — Discharge Summary (Signed)
Patient ID: Inocente Krach [id number] October 25, 1980 41 y.o.  Admit date: 10/08/2021 Discharge date: 10/19/2021  Discharge Diagnosis Perforated appendicitis with intra-abdominal abscessS/p ex lap with right colectomy and drainage of intraabdominal abscess and wound VAC placement 8/27 Dr. Andrey Campanile OSA  HTN   Consultants IR  HPI Kavaughn Faucett is an 41 y.o. male who is here for abdominal pain.  He was admitted for perforated appendicitis from 09/08/2021 to 09/12/2021.  Interventional radiology placed a CT-guided drainage catheter in the right lower quadrant abscess on 09/08/2021.  The drain was removed and drain clinic on 10/04/2021.  Since drain removal he has developed mild right lower quadrant abdominal pain and he has noticed fevers with a maximum temperature of 101 F at home.  He presented to the med center [facility] and was transferred to the surgery service at [facility] for further management.   He has been eating normally and having bowel movements.  The pain and fever were the concerning symptoms bringing him in to the hospital.  Procedures Dr. Andrey Campanile, 10/10/21 - Diagnostic laparoscopy, open exploratory laparotomy, right colectomy, drainage of intraabdominal abscess, application of wound vac (>10cm2)  Hospital Course:  Patient presented as above. Initially tx with conservative management. IR consulted and felt fluid collection was to small for percutaneous drain placement. Giving ongoing fevers, leukocytosis and pain was recommended for OR. He underwent above procedure by Dr.Wilson on 10/10/21. Abx continued post op (through 9/3). He had ileus post op and was started on TPN. Ileus resolved, ngt was removed and TPN weaned off. Surgical path benign. He was arranged for [facility] and home wound vac. Drain was removed prior to discharge. He was arranged for follow up nurse visit (staples removal) and with Dr. Andrey Campanile as noted below. On 9/5 the patient was voiding well, tolerating diet, mobilizing,  pain well controlled, vital signs stable, wound vac in place and felt stable for discharge home.     Allergies as of 10/19/2021   No Known Allergies      Medication List     STOP taking these medications    ciprofloxacin 500 MG tablet Commonly known as: CIPRO   metroNIDAZOLE 500 MG tablet Commonly known as: FLAGYL   sodium chloride flush 0.9 % Soln injection       TAKE these medications    acetaminophen 500 MG tablet Commonly known as: TYLENOL Take 2 tablets (1,000 mg total) by mouth every 8 (eight) hours as needed for mild pain.   amLODipine 10 MG tablet Commonly known as: NORVASC TAKE 1 TABLET BY MOUTH EVERY DAY   methocarbamol 500 MG tablet Commonly known as: ROBAXIN Take 2 tablets (1,000 mg total) by mouth every 6 (six) hours as needed for muscle spasms.   multivitamin with minerals tablet Take 1 tablet by mouth daily.   oxyCODONE 5 MG immediate release tablet Commonly known as: Oxy IR/ROXICODONE Take 1 tablet (5 mg total) by mouth every 6 (six) hours as needed for breakthrough pain.   valACYclovir 500 MG tablet Commonly known as: VALTREX TAKE 1 TABLET BY MOUTH EVERY DAY               Durable Medical Equipment  (From admission, onward)           Start     Ordered   10/14/21 1354  For home use only DME Negative pressure wound device  Once       Question Answer Comment  Frequency of dressing change 3 times per week  Length of need 3 Months   Dressing type Foam   Amount of suction 125 mm/Hg   Pressure application Continuous pressure   Supplies 10 canisters and 15 dressings per month for duration of therapy      10/14/21 1353              Follow-up Information     [facility], [facility] Follow up.   Specialty: Home Health Services Contact information: [address] [place] [address] [phone]         Filomena Jungling, NP Follow up.   Specialty: Nurse Practitioner Contact information: [address] [place] [address] [phone]         [facility] Follow up on 10/22/2021.   Specialty: General Surgery Why: 11am. This is a nurse visit for staple removal. Please arrive 30 minutes prior to your appointment for paperwork. Please bring a copy of your photo ID and insurance card. Contact information: [address] [place] [address] [phone]         Gaynelle Adu, MD Follow up on 11/11/2021.   Specialty: General Surgery Why: 345am. Arrive 30 minutes prior to your appointment for paperwork. Please bring a copy of your photo ID and insurance card. Contact information: [address] [place] [address] [phone]                 Signed: Leary Roca, [facility] 10/19/2021, 2:44 PM Please see Amion for pager number during day hours 7:00am-4:30pm

## 2021-10-19 NOTE — Progress Notes (Signed)
Work note printed and given to patient prior to discharge

## 2021-10-19 NOTE — Progress Notes (Signed)
Nutrition Follow-up  DOCUMENTATION CODES:   Not applicable  INTERVENTION:  Encourage adequate PO intake Ensure Enlive po TID, each supplement provides 350 kcal and 20 grams of protein. MVI with minerals daily  NUTRITION DIAGNOSIS:   Increased nutrient needs related to post-op healing as evidenced by estimated needs.  Ongoing  GOAL:   Patient will meet greater than or equal to 90% of their needs  Addressing via diet advancement  MONITOR:   Diet advancement, Labs, I & O's, Weight trends  REASON FOR ASSESSMENT:   Consult New TPN/TNA  ASSESSMENT:   41 y.o. male presented to the [facility] with RLQ pain and fevers. Pt admitted 7/26-7/30 with a perforated appendicitis; RLQ drain removed 8/21 in office. PMH include HTN.  Pt admitted with residual fluid collection and infection in RLQ after drain removal.  8/27 - ex-lap s/p R colectomy, drainage of intraabdominal abscess, and application of wound VAC; NG tube placed  8/28 - PICC placed  9/4- diet advanced to regular, TPN discontinued  Noted plans for patient to discharge pending home VAC arrangements.   He states that he has been tolerating his diet with absence of nausea, vomiting, and distension. He states that he is eating but trying to pace himself to ensure diet tolerance. Discussed with him importance of maintaining adequate nutritional intake and use of nutritional supplements at discharge to aid in ongoing wound healing.   Meal completions: 9/4: 100% breakfast, 100% lunch 9/5: 20% breakfast  Edema: non-pitting LUE, BLE  Medications: MVI with minerals daily  Labs: sodium 133, phos 4.9, ALT 91, CBG's 134-152 x24 hours  JP drain: 3ml x12 hours  Diet Order:   Diet Order             Diet regular Room service appropriate? Yes; Fluid consistency: Thin  Diet effective now                   EDUCATION NEEDS:   No education needs have been identified at this time  Skin:  Skin Assessment: Skin Integrity  Issues: Skin Integrity Issues:: Wound VAC Wound Vac: Abdomen  Last BM:  9/4 (type 5)  Height:   Ht Readings from Last 1 Encounters:  10/08/21 6\' 1"  (1.854 m)    Weight:   Wt Readings from Last 1 Encounters:  10/15/21 117 kg    Ideal Body Weight:  83.6 kg  BMI:  Body mass index is 34.03 kg/m.  Estimated Nutritional Needs:   Kcal:  2400-2600  Protein:  120-140 grams  Fluid:  >/= 2.4 L  12/15/21, RDN, LDN Clinical Nutrition

## 2021-10-19 NOTE — Progress Notes (Addendum)
9 Days Post-Op  Subjective: CC: Doing well. Reports no abdominal pain at rest. Some soreness at his incision when oob. Well controlled with medications. No PRN pain or nausea meds this am. Tolerating regular diet without n/v. Off TPN. Passing flatus. BM yesterday. Voiding without difficulty. Reports he is mobilizing in the halls. Wound vac changed by WOCN yesterday.   Objective: Vital signs in last 24 hours: Temp:  [98.3 F (36.8 C)-99 F (37.2 C)] 99 F (37.2 C) (09/05 0758) Pulse Rate:  [99-106] 106 (09/05 0758) Resp:  [16-18] 17 (09/05 0758) BP: (116-122)/(80-84) 120/80 (09/05 0758) SpO2:  [97 %-100 %] 99 % (09/05 0758) Last BM Date : 10/18/21  Intake/Output from previous day: 09/04 0701 - 09/05 0700 In: 460 [P.O.:460] Out: 5 [Drains:5] Intake/Output this shift: Total I/O In: 80 [P.O.:60; I.V.:20] Out: -   PE: Gen:  Alert, NAD, pleasant Card:  Reg rate on my exam. Tachycardic on last vitals - 106 Pulm:  CTAB, no W/R/R, effort normal Abd: Soft, ND, appropriately tender only around his incision - otherwise NT. +BS, LUQ lap incision with staples in place -cdi. Drain bloody/ss with 5cc/24 hours. Midline wound with vac in place with good seal.  Ext:  No LE edema or calf tenderness Psych: A&Ox3   Lab Results:  Recent Labs    10/17/21 0438  WBC 11.8*  HGB 12.5*  HCT 38.9*  PLT 707*   BMET Recent Labs    10/17/21 0438 10/18/21 0533  NA 135 133*  K 4.3 4.2  CL 102 101  CO2 23 24  GLUCOSE 163* 142*  BUN 12 15  CREATININE 0.78 0.85  CALCIUM 9.2 9.1   PT/INR No results for input(s): "LABPROT", "INR" in the last 72 hours. CMP     Component Value Date/Time   NA 133 (L) 10/18/2021 0533   NA 139 03/19/2018 1440   K 4.2 10/18/2021 0533   CL 101 10/18/2021 0533   CO2 24 10/18/2021 0533   GLUCOSE 142 (H) 10/18/2021 0533   BUN 15 10/18/2021 0533   BUN 13 03/19/2018 1440   CREATININE 0.85 10/18/2021 0533   CREATININE 1.01 02/17/2016 1017   CALCIUM 9.1  10/18/2021 0533   PROT 7.8 10/18/2021 0533   PROT 7.3 03/19/2018 1440   ALBUMIN 2.9 (L) 10/18/2021 0533   ALBUMIN 4.3 03/19/2018 1440   AST 41 10/18/2021 0533   ALT 91 (H) 10/18/2021 0533   ALKPHOS 74 10/18/2021 0533   BILITOT 0.4 10/18/2021 0533   BILITOT 0.4 03/19/2018 1440   GFRNONAA >60 10/18/2021 0533   GFRAA 97 03/19/2018 1440   Lipase     Component Value Date/Time   LIPASE 14 10/08/2021 1224    Studies/Results: No results found.  Anti-infectives: Anti-infectives (From admission, onward)    Start     Dose/Rate Route Frequency Ordered Stop   10/08/21 2315  piperacillin-tazobactam (ZOSYN) IVPB 3.375 g        3.375 g 12.5 mL/hr over 240 Minutes Intravenous Every 8 hours 10/08/21 2230 10/18/21 0359   10/08/21 2245  piperacillin-tazobactam (ZOSYN) IVPB 3.375 g  Status:  Discontinued        3.375 g 100 mL/hr over 30 Minutes Intravenous Every 8 hours 10/08/21 2151 10/08/21 2229        Assessment/Plan POD #9, s/p ex lap with right colectomy and drainage of intraabdominal abscess and wound VAC placement 8/27 Dr. Andrey Campanile - Surgical path benign - Off TPN. Tolerating diet without n/v and having bowel function.  -  Completed post op abx.  - Cont JP, currently SS. Likely remove prior to discharge.  - Wound vac - M/W/F. TOC arranging home vac and HH - Mobilize in halls - Pulm toilet - Possible d/c later this afternoon if home health and home vac can be arranged. Discussed with TOC at floor rounds today. They will reach out to me once they have more info.    FEN - Reg diet. SLIV VTE - SCDs, Lovenox ID - zosyn 8/25 - 9/3. WBC stable on last check 9/3. Afebrile. D/c PICC   ABL anemia - Hgb stable  OSA - CPAP HTN -  home meds Obesity BMI 36.3   LOS: 11 days    Ryan Herman , [facility] 10/19/2021, 10:16 AM Please see Amion for pager number during day hours 7:00am-4:30pm

## 2021-10-19 NOTE — Discharge Instructions (Signed)
[facility]      [facility], [place] [phone]  OPEN ABDOMINAL SURGERY: POST OP INSTRUCTIONS  Always review your discharge instruction sheet given to you by the facility where your surgery was performed.  IF YOU HAVE DISABILITY OR FAMILY LEAVE FORMS, YOU MUST BRING THEM TO THE OFFICE FOR PROCESSING.  PLEASE DO NOT GIVE THEM TO YOUR DOCTOR.  A prescription for pain medication may be given to you upon discharge.  Take your pain medication as prescribed, if needed.  If narcotic pain medicine is not needed, then you may take acetaminophen (Tylenol) or ibuprofen (Advil) as needed. Take your usually prescribed medications unless otherwise directed. If you need a refill on your pain medication, please contact your pharmacy. They will contact our office to request authorization.  Prescriptions will not be filled after 5pm or on week-ends. You should follow a light diet the first few days after arrival home, such as soup and crackers, pudding, etc.unless your doctor has advised otherwise. A high-fiber, low fat diet can be resumed as tolerated.   Be sure to include lots of fluids daily. Most patients will experience some swelling and bruising on the chest and neck area.  Ice packs will help.  Swelling and bruising can take several days to resolve Most patients will experience some swelling and bruising in the area of the incision. Ice pack will help. Swelling and bruising can take several days to resolve..  It is common to experience some constipation if taking pain medication after surgery.  Increasing fluid intake and taking a stool softener will usually help or prevent this problem from occurring.  A mild laxative (Milk of Magnesia or Miralax) should be taken according to package directions if there are no bowel movements after 48 hours.  We have arranged home health for your midline wound that is currently being managed with a wound vac. If you have any issues with this, please call our office.   ACTIVITIES:  You may resume regular (light) daily activities beginning the next day--such as daily self-care, walking, climbing stairs--gradually increasing activities as tolerated.  You may have sexual intercourse when it is comfortable.  Refrain from any heavy lifting or straining until approved by your doctor. You may drive when you no longer are taking prescription pain medication, you can comfortably wear a seatbelt, and you can safely maneuver your car and apply brakes Return to Work: A note was given for work  You should see your doctor in the office for a follow-up appointment approximately two weeks after your surgery.  Make sure that you call for this appointment within a day or two after you arrive home to insure a convenient appointment time.   WHEN TO CALL YOUR DOCTOR: Fever over 101.0 Inability to urinate Nausea and/or vomiting Extreme swelling or bruising Continued bleeding from incision. Increased pain, redness, or drainage from the incision. Difficulty swallowing or breathing Muscle cramping or spasms. Numbness or tingling in hands or feet or around lips.  The clinic staff is available to answer your questions during regular business hours.  Please don't hesitate to call and ask to speak to one of the nurses if you have concerns.  For further questions, please visit [email or web address]

## 2021-10-28 ENCOUNTER — Other Ambulatory Visit: Payer: Self-pay | Admitting: General Surgery

## 2021-10-28 DIAGNOSIS — Z9889 Other specified postprocedural states: Secondary | ICD-10-CM

## 2021-11-01 ENCOUNTER — Ambulatory Visit
Admission: RE | Admit: 2021-11-01 | Discharge: 2021-11-01 | Disposition: A | Discharge status: Home / Self Care | Payer: Commercial Managed Care - PPO | Source: Ambulatory Visit | Attending: General Surgery | Admitting: General Surgery

## 2021-11-01 DIAGNOSIS — Z9889 Other specified postprocedural states: Secondary | ICD-10-CM

## 2021-11-01 MED ORDER — IOPAMIDOL (ISOVUE-370) INJECTION 76%
80.0000 mL | Freq: Once | INTRAVENOUS | Status: AC | PRN
Start: 2021-11-01 — End: 2021-11-01
  Administered 2021-11-01: 80 mL via INTRAVENOUS

## 2021-11-09 ENCOUNTER — Other Ambulatory Visit: Payer: Commercial Managed Care - PPO

## 2021-11-23 ENCOUNTER — Other Ambulatory Visit: Payer: Commercial Managed Care - PPO

## 2024-01-17 ENCOUNTER — Ambulatory Visit: Admitting: Cardiology

## 2024-01-19 NOTE — Progress Notes (Signed)
 Cardiology Office Note:   Date:  01/22/2024  ID:  Ryan Herman, DOB Jun 30, 1980, MRN [id number] PCP:  Celestia Harder, NP  [facility] Providers Cardiologist:  Wendel Haws, MD Referring MD: Dartha Geralds, DO  Chief Complaint/Reason for Referral: Hypertension ASSESSMENT:    1. Resistant hypertension   2. Prediabetes   3. OSA on CPAP   4. BMI 38.0-38.9,adult   5. General medical examination     PLAN:   In order of problems listed above: Hypertension: Continue amlodipine /valsartan/hydrochlorothiazide 10 x 320 x 25 mg daily.  Obtain echocardiogram.  Blood pressure is well-controlled today.   Prediabetes: Hemoglobin A1c recently was 6.2. OSA: Continue CPAP Elevated BMI: Diet and exercise modification for now. General medical examination: LDL recently was 60.  Will check LP(a) today.  If elevated will consider statin.            Dispo:  Return in about 6 months (around 07/22/2024).       I spent 42 minutes reviewing all clinical data during and prior to this visit including all relevant imaging studies, laboratories, clinical information from other health systems and prior notes from both Cardiology and other specialties, interviewing the patient, conducting a complete physical examination, and coordinating care in order to formulate a comprehensive and personalized evaluation and treatment plan.   History of Present Illness:    FOCUSED PROBLEM LIST:   Hypertension Prediabetes Hemoglobin A1c 6.16 November 2023 OSA On CPAP BMI 38  02/20/2024:  Patient consents to use of AI scribe. The patient is a 43 year old male with above listed medical problems referred for hypertension.  The patient had been seen by a primary care provider recently.  They are found to be hypertensive.  There dose of amlodipine /valsartan/hydrochlorothiazide was increased from 10 x 160 x 25 mg daily to 10 x 320 x 25 mg daily.  Repeat blood pressure on follow-up was still elevated.  He is here for  further recommendations.   Recently, his blood pressure readings at home have been around 123/80 mmHg and sometimes 130/70 mmHg. No chest pain, leg swelling, or palpitations.  He has been diagnosed with prediabetes since 02-20-15 and undergoes regular A1c testing every three to six months. His most recent tests confirm that he remains prediabetic. He wants to lose weight to potentially reduce medication dependency.  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 He sleeps on his side most of the night due to the CPAP.  He does not smoke and has no family history of heart problems, although his father passed away from diabetes in 2007/02/20.     Current Medications: Current Meds  Medication Sig   acetaminophen  (TYLENOL ) 500 MG tablet Take 2 tablets (1,000 mg total) by mouth every 8 (eight) hours as needed for mild pain.   amLODIPine -Valsartan-HCTZ 10-320-25 MG TABS Take 1 tablet by mouth daily at 6 (six) AM.   methocarbamol  (ROBAXIN ) 500 MG tablet Take 2 tablets (1,000 mg total) by mouth every 6 (six) hours as needed for muscle spasms.   Multiple Vitamins-Minerals (MULTIVITAMIN WITH MINERALS) tablet Take 1 tablet by mouth daily.   oxyCODONE  (OXY IR/ROXICODONE ) 5 MG immediate release tablet Take 1 tablet (5 mg total) by mouth every 6 (six) hours as needed for breakthrough pain.   valACYclovir  (VALTREX ) 500 MG tablet TAKE 1 TABLET BY MOUTH EVERY DAY (Patient taking differently: Take 500 mg by mouth daily.)     Review of Systems:  Please see the history of present illness.    All other systems reviewed and are negative.     EKGs/Labs/Other Test Reviewed:   EKG: EKG done today demonstrates sinus tachycardia    CARDIAC STUDIES: Refer to CV Procedures and Imaging Tabs   Risk Assessment/Calculations:          Physical Exam:   VS:  BP 132/76   Pulse (!) 115   Ht 6' 1 (1.854 m)   Wt 287 lb (130.2 kg)   SpO2 96%   BMI 37.87  kg/m        Wt Readings from Last 3 Encounters:  01/22/24 287 lb (130.2 kg)  10/15/21 257 lb 15 oz (117 kg)  09/04/21 275 lb 2.2 oz (124.8 kg)      GENERAL:  No apparent distress, AOx3 HEENT:  No carotid bruits, +2 carotid impulses, no scleral icterus CAR: Tachycardic no murmurs, gallops, rubs, or thrills RES:  Clear to auscultation bilaterally ABD:  Soft, nontender, nondistended, positive bowel sounds x 4 VASC:  +2 radial pulses, +2 carotid pulses NEURO:  CN 2-12 grossly intact; motor and sensory grossly intact PSYCH:  No active depression or anxiety EXT:  No edema, ecchymosis, or cyanosis  Signed, Shine Mikes K Shafter Jupin, MD  01/22/2024 2:25 PM    [facility] [address] [place], [place], [place]  [address] Phone: [phone]; Fax: [phone]   Note:  This document was prepared using Dragon voice recognition software and may include unintentional dictation errors.

## 2024-01-22 ENCOUNTER — Encounter: Payer: Self-pay | Admitting: Internal Medicine

## 2024-01-22 ENCOUNTER — Other Ambulatory Visit: Payer: Self-pay | Admitting: Internal Medicine

## 2024-01-22 ENCOUNTER — Ambulatory Visit: Attending: Cardiology | Admitting: Internal Medicine

## 2024-01-22 VITALS — BP 132/76 | HR 115 | Ht 73.0 in | Wt 287.0 lb

## 2024-01-22 DIAGNOSIS — Z0000 Encounter for general adult medical examination without abnormal findings: Secondary | ICD-10-CM

## 2024-01-22 DIAGNOSIS — I1A0 Resistant hypertension: Secondary | ICD-10-CM

## 2024-01-22 DIAGNOSIS — G4733 Obstructive sleep apnea (adult) (pediatric): Secondary | ICD-10-CM | POA: Diagnosis not present

## 2024-01-22 DIAGNOSIS — Z6838 Body mass index (BMI) 38.0-38.9, adult: Secondary | ICD-10-CM | POA: Diagnosis not present

## 2024-01-22 DIAGNOSIS — R7303 Prediabetes: Secondary | ICD-10-CM | POA: Diagnosis not present

## 2024-01-22 NOTE — Patient Instructions (Addendum)
 Medication Instructions:  Your physician recommends that you continue on your current medications as directed. Please refer to the Current Medication list given to you today.  *If you need a refill on your cardiac medications before your next appointment, please call your pharmacy*  Lab Work: Today: Lipoprotein a  If you have any lab test that is abnormal or we need to change your treatment, we will call you to review the results.  Testing/Procedures: Your physician has requested that you have an echocardiogram. Echocardiography is a painless test that uses sound waves to create images of your heart. It provides your doctor with information about the size and shape of your heart and how well your heart's chambers and valves are working. This procedure takes approximately one hour. There are no restrictions for this procedure. Please do NOT wear cologne, perfume, aftershave, or lotions (deodorant is allowed). Please arrive 15 minutes prior to your appointment time.  Please note: We ask at that you not bring children with you during ultrasound (echo/ vascular) testing. Due to room size and safety concerns, children are not allowed in the ultrasound rooms during exams. Our front office staff cannot provide observation of children in our lobby area while testing is being conducted. An adult accompanying a patient to their appointment will only be allowed in the ultrasound room at the discretion of the ultrasound technician under special circumstances. We apologize for any inconvenience.   Follow-Up: At [facility], you and your health needs are our priority.  As part of our continuing mission to provide you with exceptional heart care, our providers are all part of one team.  This team includes your primary Cardiologist (physician) and Advanced Practice Providers or APPs (Physician Assistants and Nurse Practitioners) who all work together to provide you with the care you need, when you need  it.  Your next appointment:   6 month(s)  Provider:   Dr. Wendel   Thank you for choosing [facility]!!   [phone]

## 2024-01-23 LAB — LIPOPROTEIN A (LPA): Lipoprotein (a): 84.9 nmol/L — ABNORMAL HIGH (ref <75.0)

## 2024-01-24 ENCOUNTER — Ambulatory Visit: Payer: Self-pay | Admitting: Internal Medicine

## 2024-01-24 DIAGNOSIS — E785 Hyperlipidemia, unspecified: Secondary | ICD-10-CM

## 2024-01-24 DIAGNOSIS — I1A0 Resistant hypertension: Secondary | ICD-10-CM

## 2024-01-24 DIAGNOSIS — Z6838 Body mass index (BMI) 38.0-38.9, adult: Secondary | ICD-10-CM

## 2024-01-25 ENCOUNTER — Other Ambulatory Visit: Payer: Self-pay

## 2024-01-25 DIAGNOSIS — E785 Hyperlipidemia, unspecified: Secondary | ICD-10-CM

## 2024-01-25 MED ORDER — ATORVASTATIN CALCIUM 20 MG PO TABS: 20.0000 mg | ORAL_TABLET | Freq: Every day | ORAL | 0 refills | Status: AC

## 2024-01-30 ENCOUNTER — Telehealth (HOSPITAL_BASED_OUTPATIENT_CLINIC_OR_DEPARTMENT_OTHER): Payer: Self-pay

## 2024-01-30 NOTE — Telephone Encounter (Signed)
° °  Pre-operative Risk Assessment    Patient Name: Ryan Herman  DOB: 1980/02/25 MRN: [id number]   Date of last office visit: 01/22/2024 with Dr. Wendel Date of next office visit: None  Request for Surgical Clearance    Procedure:  Exam under anesthesia and treatment for an anal fissure  Date of Surgery:  Clearance 02/21/2024                                 Surgeon:  Coleen Pae Surgeon's Group or Practice Name:  [facility]  Phone number:  [phone] Fax number:  [phone]   Type of Clearance Requested:   - Medical    Type of Anesthesia:  General    Additional requests/questions:  Echo ordered by Thukkani and scheduled for 02/23/2024. Does this patient's echo need to precede his upcoming surgery, or is he stable/acceptable risk from a cardiovascular standpoint to precede at this time?  SignedPatrcia Hong L   01/30/2024, 10:40 AM

## 2024-02-05 NOTE — Telephone Encounter (Signed)
 Echo has been moved to 02/09/24.

## 2024-02-09 ENCOUNTER — Ambulatory Visit (HOSPITAL_COMMUNITY)
Admission: RE | Admit: 2024-02-09 | Discharge: 2024-02-09 | Disposition: A | Discharge status: Home / Self Care | Source: Ambulatory Visit | Attending: Internal Medicine | Admitting: Internal Medicine

## 2024-02-09 DIAGNOSIS — I1A0 Resistant hypertension: Secondary | ICD-10-CM | POA: Insufficient documentation

## 2024-02-09 DIAGNOSIS — I77810 Thoracic aortic ectasia: Secondary | ICD-10-CM | POA: Diagnosis not present

## 2024-02-09 DIAGNOSIS — I10 Essential (primary) hypertension: Secondary | ICD-10-CM

## 2024-02-09 LAB — ECHOCARDIOGRAM COMPLETE
AR max vel: 5.56 cm2
AV Area VTI: 6.49 cm2
AV Area mean vel: 5.46 cm2
AV Mean grad: 2 mmHg
AV Peak grad: 3.5 mmHg
Ao pk vel: 0.93 m/s
Area-P 1/2: 3.56 cm2

## 2024-02-09 NOTE — Progress Notes (Signed)
*  PRELIMINARY RESULTS* Echocardiogram 2D Echocardiogram has been performed.  Ryan Herman 02/09/2024, 3:29 PM

## 2024-02-10 ENCOUNTER — Ambulatory Visit: Payer: Self-pay | Admitting: Internal Medicine

## 2024-02-12 NOTE — Telephone Encounter (Signed)
"  ° °  Patient Name: Ryan Herman  DOB: 1980/06/13 MRN: [id number]  Primary Cardiologist: Dr. Wendel  Chart reviewed as part of pre-operative protocol coverage. Given past medical history and time since last visit, based on ACC/AHA guidelines, [name] is at acceptable risk for the planned procedure without further cardiovascular testing.   The patient was advised that if he develops new symptoms prior to surgery to contact our office to arrange for a follow-up visit, and he verbalized understanding.  I will route this recommendation to the requesting party via Epic fax function and remove from pre-op pool.  Please call with questions.  Lamarr Satterfield, NP 02/12/2024, 7:51 AM  "

## 2024-02-20 ENCOUNTER — Ambulatory Visit: Admitting: Cardiology

## 2024-02-23 ENCOUNTER — Ambulatory Visit (HOSPITAL_COMMUNITY)
